# Patient Record
Sex: Male | Born: 1960
Health system: Southern US, Community
[De-identification: ages and names within clinical notes are randomized; demographics above are authoritative.]

## PROBLEM LIST (undated history)

## (undated) DIAGNOSIS — J45909 Unspecified asthma, uncomplicated: Secondary | ICD-10-CM

## (undated) DIAGNOSIS — Z9289 Personal history of other medical treatment: Secondary | ICD-10-CM

## (undated) DIAGNOSIS — I10 Essential (primary) hypertension: Secondary | ICD-10-CM

## (undated) DIAGNOSIS — M7711 Lateral epicondylitis, right elbow: Secondary | ICD-10-CM

## (undated) DIAGNOSIS — I341 Nonrheumatic mitral (valve) prolapse: Secondary | ICD-10-CM

## (undated) DIAGNOSIS — R079 Chest pain, unspecified: Secondary | ICD-10-CM

## (undated) DIAGNOSIS — R002 Palpitations: Secondary | ICD-10-CM

## (undated) DIAGNOSIS — R911 Solitary pulmonary nodule: Secondary | ICD-10-CM

## (undated) DIAGNOSIS — F411 Generalized anxiety disorder: Secondary | ICD-10-CM

## (undated) DIAGNOSIS — J309 Allergic rhinitis, unspecified: Secondary | ICD-10-CM

## (undated) HISTORY — DX: Personal history of other medical treatment: Z92.89

## (undated) HISTORY — DX: Palpitations: R00.2

## (undated) HISTORY — DX: Essential (primary) hypertension: I10

## (undated) HISTORY — PX: UMBILICAL HERNIA REPAIR: SHX196

## (undated) HISTORY — DX: Chest pain, unspecified: R07.9

## (undated) HISTORY — DX: Generalized anxiety disorder: F41.1

## (undated) HISTORY — DX: Allergic rhinitis, unspecified: J30.9

---

## 1999-06-15 ENCOUNTER — Encounter: Admission: RE | Admit: 1999-06-15 | Discharge: 1999-07-06 | Payer: Self-pay | Admitting: Internal Medicine

## 2000-03-20 ENCOUNTER — Emergency Department (HOSPITAL_COMMUNITY): Admission: EM | Admit: 2000-03-20 | Discharge: 2000-03-20 | Payer: Self-pay | Admitting: Emergency Medicine

## 2000-04-11 ENCOUNTER — Emergency Department (HOSPITAL_COMMUNITY): Admission: EM | Admit: 2000-04-11 | Discharge: 2000-04-11 | Payer: Self-pay | Admitting: Emergency Medicine

## 2001-03-10 ENCOUNTER — Ambulatory Visit (HOSPITAL_COMMUNITY): Admission: RE | Admit: 2001-03-10 | Discharge: 2001-03-10 | Payer: Self-pay | Admitting: Internal Medicine

## 2002-06-16 ENCOUNTER — Encounter: Payer: Self-pay | Admitting: Emergency Medicine

## 2002-06-16 ENCOUNTER — Emergency Department (HOSPITAL_COMMUNITY): Admission: EM | Admit: 2002-06-16 | Discharge: 2002-06-16 | Payer: Self-pay | Admitting: Cardiology

## 2002-08-06 ENCOUNTER — Emergency Department (HOSPITAL_COMMUNITY): Admission: EM | Admit: 2002-08-06 | Discharge: 2002-08-06 | Payer: Self-pay | Admitting: Emergency Medicine

## 2002-08-06 ENCOUNTER — Encounter: Payer: Self-pay | Admitting: Emergency Medicine

## 2002-11-20 ENCOUNTER — Emergency Department (HOSPITAL_COMMUNITY): Admission: EM | Admit: 2002-11-20 | Discharge: 2002-11-20 | Payer: Self-pay | Admitting: Emergency Medicine

## 2003-03-05 ENCOUNTER — Encounter: Payer: Self-pay | Admitting: Emergency Medicine

## 2003-03-05 ENCOUNTER — Emergency Department (HOSPITAL_COMMUNITY): Admission: EM | Admit: 2003-03-05 | Discharge: 2003-03-05 | Payer: Self-pay | Admitting: Emergency Medicine

## 2003-07-17 ENCOUNTER — Emergency Department (HOSPITAL_COMMUNITY): Admission: EM | Admit: 2003-07-17 | Discharge: 2003-07-17 | Payer: Self-pay | Admitting: Emergency Medicine

## 2003-07-17 ENCOUNTER — Encounter: Payer: Self-pay | Admitting: Emergency Medicine

## 2003-07-26 ENCOUNTER — Encounter: Admission: RE | Admit: 2003-07-26 | Discharge: 2003-07-26 | Payer: Self-pay | Admitting: Orthopedic Surgery

## 2003-07-26 ENCOUNTER — Encounter: Payer: Self-pay | Admitting: Orthopedic Surgery

## 2004-01-24 ENCOUNTER — Emergency Department (HOSPITAL_COMMUNITY): Admission: EM | Admit: 2004-01-24 | Discharge: 2004-01-24 | Payer: Self-pay | Admitting: Emergency Medicine

## 2004-05-20 ENCOUNTER — Encounter: Admission: RE | Admit: 2004-05-20 | Discharge: 2004-05-20 | Payer: Self-pay

## 2004-07-03 ENCOUNTER — Encounter: Admission: RE | Admit: 2004-07-03 | Discharge: 2004-07-03 | Payer: Self-pay | Admitting: Internal Medicine

## 2005-01-11 ENCOUNTER — Ambulatory Visit: Payer: Self-pay | Admitting: Pulmonary Disease

## 2005-03-19 ENCOUNTER — Emergency Department (HOSPITAL_COMMUNITY): Admission: EM | Admit: 2005-03-19 | Discharge: 2005-03-19 | Payer: Self-pay | Admitting: Emergency Medicine

## 2005-03-31 ENCOUNTER — Emergency Department (HOSPITAL_COMMUNITY): Admission: EM | Admit: 2005-03-31 | Discharge: 2005-03-31 | Payer: Self-pay | Admitting: Emergency Medicine

## 2005-05-21 ENCOUNTER — Ambulatory Visit: Payer: Self-pay | Admitting: Pulmonary Disease

## 2005-07-21 ENCOUNTER — Ambulatory Visit: Payer: Self-pay | Admitting: Pulmonary Disease

## 2005-07-22 ENCOUNTER — Ambulatory Visit: Payer: Self-pay | Admitting: Cardiology

## 2005-07-28 ENCOUNTER — Emergency Department (HOSPITAL_COMMUNITY): Admission: EM | Admit: 2005-07-28 | Discharge: 2005-07-28 | Payer: Self-pay | Admitting: Emergency Medicine

## 2006-01-07 ENCOUNTER — Ambulatory Visit: Payer: Self-pay | Admitting: Pulmonary Disease

## 2006-02-08 ENCOUNTER — Emergency Department (HOSPITAL_COMMUNITY): Admission: EM | Admit: 2006-02-08 | Discharge: 2006-02-08 | Payer: Self-pay | Admitting: Family Medicine

## 2006-03-17 IMAGING — CR DG CHEST 2V
2 series · 2 of 2 positions shown · non-contrast
Comparison: none

CLINICAL DATA: Dyspnea, left arm pain.
 CHEST - 2 VIEW:

[w chest pa]
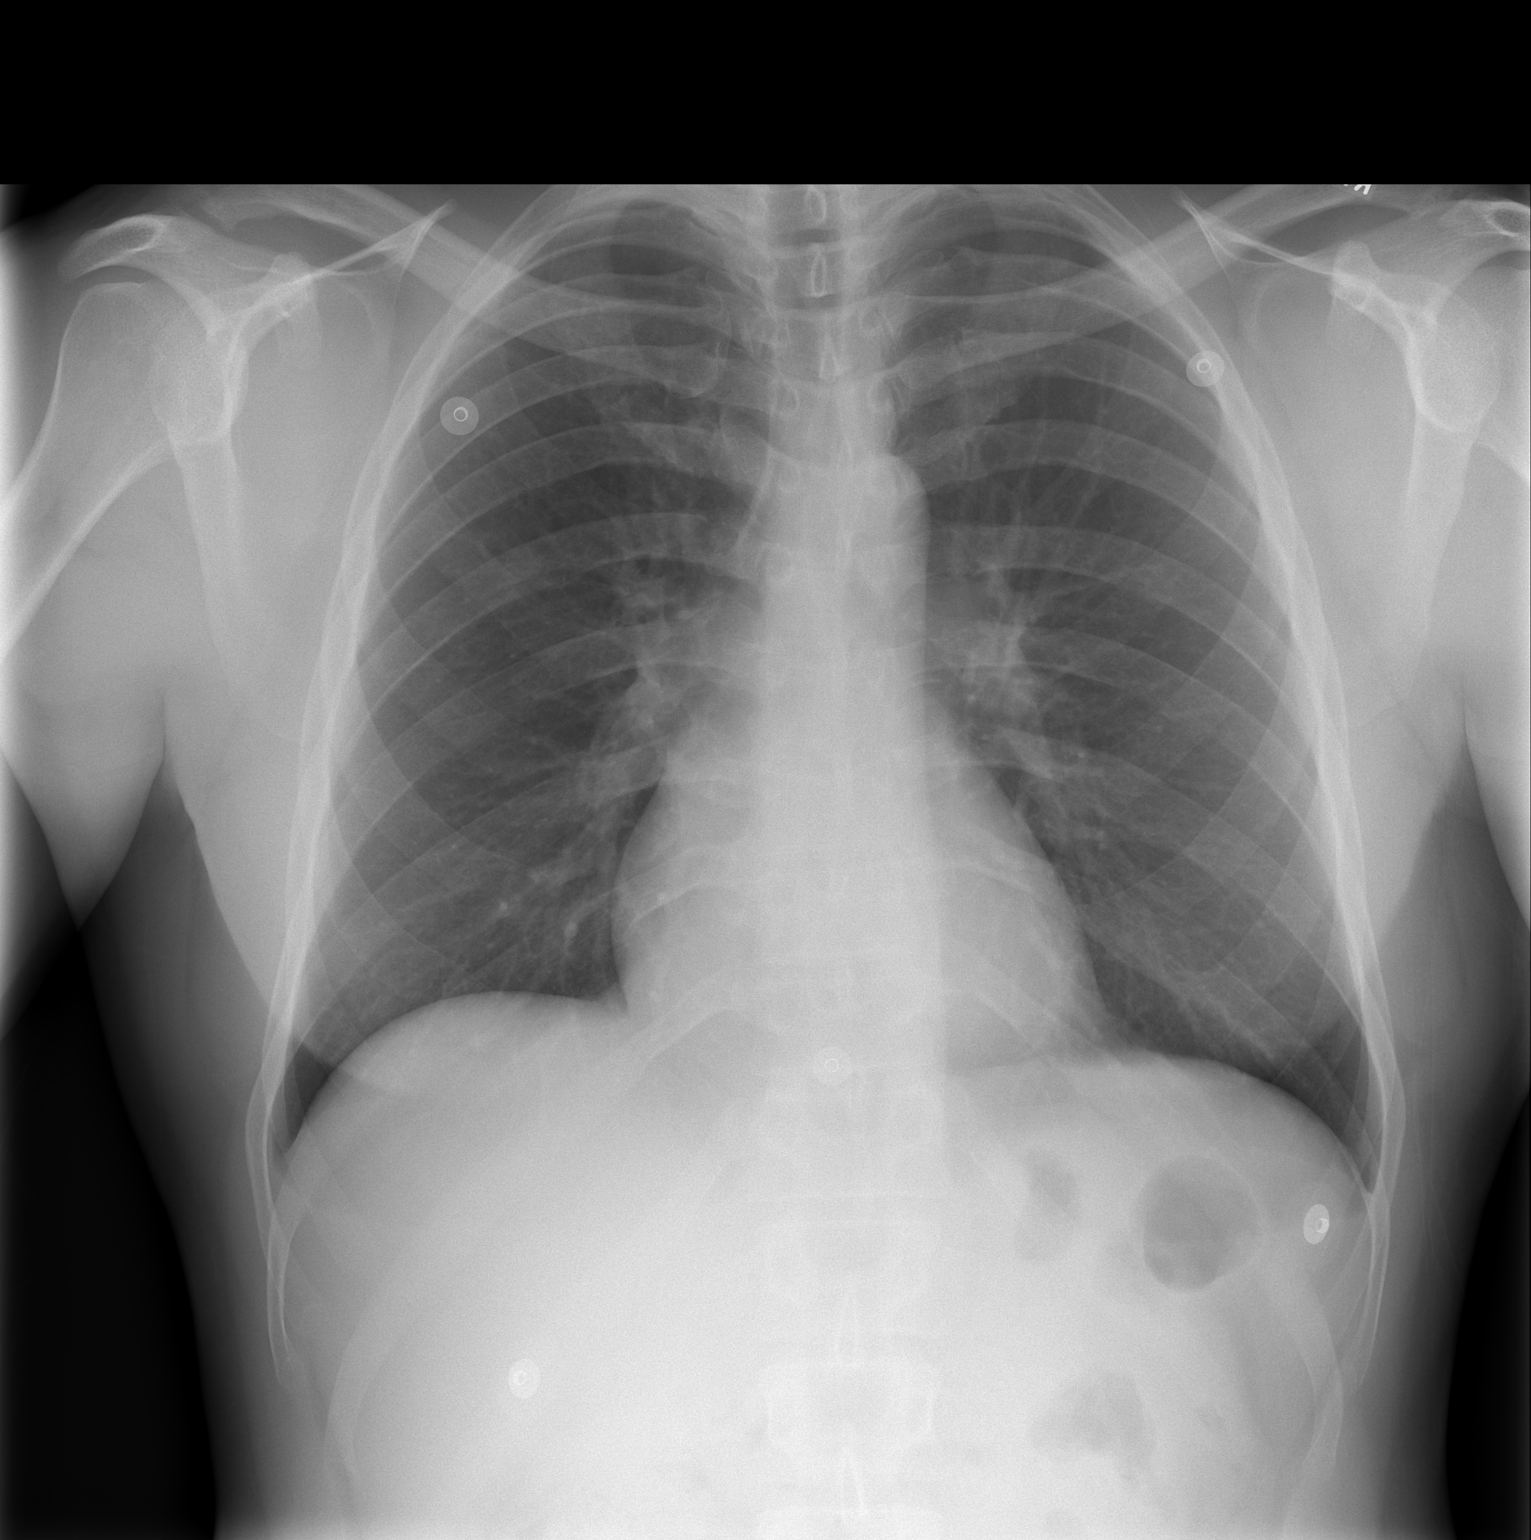

[w chest lat]
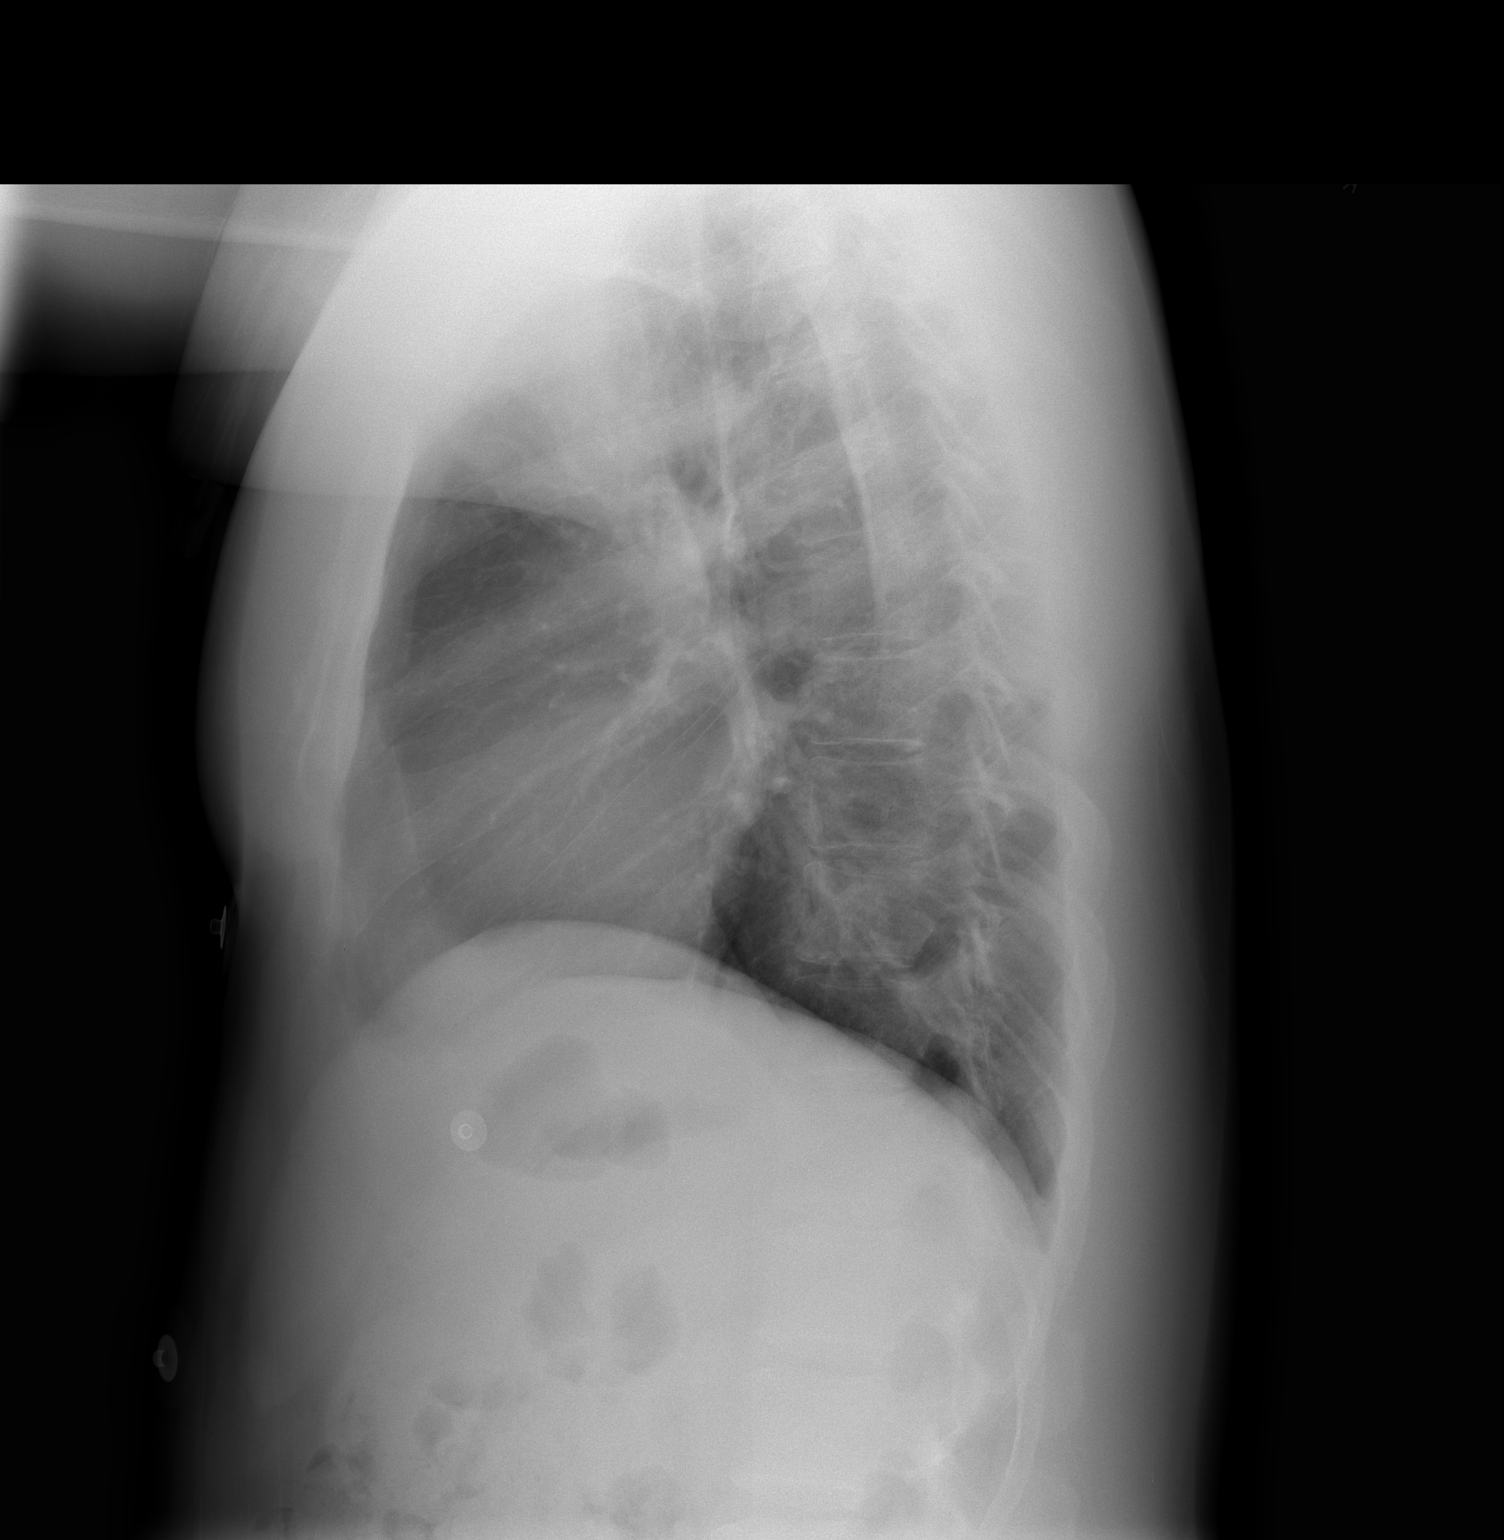

[2 of 2 positions shown; findings below may reference images not displayed]

FINDINGS: Cardiomediastinal silhouette is unremarkable.  Mild peribronchial thickening is unchanged.  No pleural effusions identified.
IMPRESSION: 1.  No acute abnormality.
 2.  Stable mild peribronchial thickening.

## 2006-05-24 ENCOUNTER — Ambulatory Visit: Payer: Self-pay | Admitting: Pulmonary Disease

## 2006-05-31 ENCOUNTER — Ambulatory Visit: Payer: Self-pay | Admitting: Pulmonary Disease

## 2006-06-20 ENCOUNTER — Ambulatory Visit: Payer: Self-pay | Admitting: Pulmonary Disease

## 2006-07-14 ENCOUNTER — Emergency Department (HOSPITAL_COMMUNITY): Admission: EM | Admit: 2006-07-14 | Discharge: 2006-07-14 | Payer: Self-pay | Admitting: Family Medicine

## 2006-07-25 ENCOUNTER — Ambulatory Visit: Payer: Self-pay | Admitting: Pulmonary Disease

## 2006-07-25 ENCOUNTER — Ambulatory Visit (HOSPITAL_COMMUNITY): Admission: RE | Admit: 2006-07-25 | Discharge: 2006-07-25 | Payer: Self-pay | Admitting: Pulmonary Disease

## 2006-10-29 ENCOUNTER — Emergency Department (HOSPITAL_COMMUNITY): Admission: AD | Admit: 2006-10-29 | Discharge: 2006-10-29 | Payer: Self-pay | Admitting: Emergency Medicine

## 2006-11-22 ENCOUNTER — Ambulatory Visit: Payer: Self-pay | Admitting: Pulmonary Disease

## 2006-12-18 ENCOUNTER — Emergency Department (HOSPITAL_COMMUNITY): Admission: EM | Admit: 2006-12-18 | Discharge: 2006-12-18 | Payer: Self-pay | Admitting: Family Medicine

## 2007-01-18 DIAGNOSIS — Z9289 Personal history of other medical treatment: Secondary | ICD-10-CM

## 2007-01-18 HISTORY — DX: Personal history of other medical treatment: Z92.89

## 2007-01-24 HISTORY — PX: SHOULDER ARTHROSCOPY: SHX128

## 2007-01-25 ENCOUNTER — Encounter: Admission: RE | Admit: 2007-01-25 | Discharge: 2007-01-25 | Payer: Self-pay | Admitting: Orthopedic Surgery

## 2007-02-17 ENCOUNTER — Ambulatory Visit (HOSPITAL_COMMUNITY): Admission: RE | Admit: 2007-02-17 | Discharge: 2007-02-17 | Payer: Self-pay | Admitting: Orthopedic Surgery

## 2007-10-07 IMAGING — CR DG CHEST 2V
2 series · 2 of 2 positions shown · non-contrast
Comparison: 07/28/05.

Exam: Chest, 2 views.

HISTORY: Right shoulder labral tear. Preoperative graft.

[w chest pa]
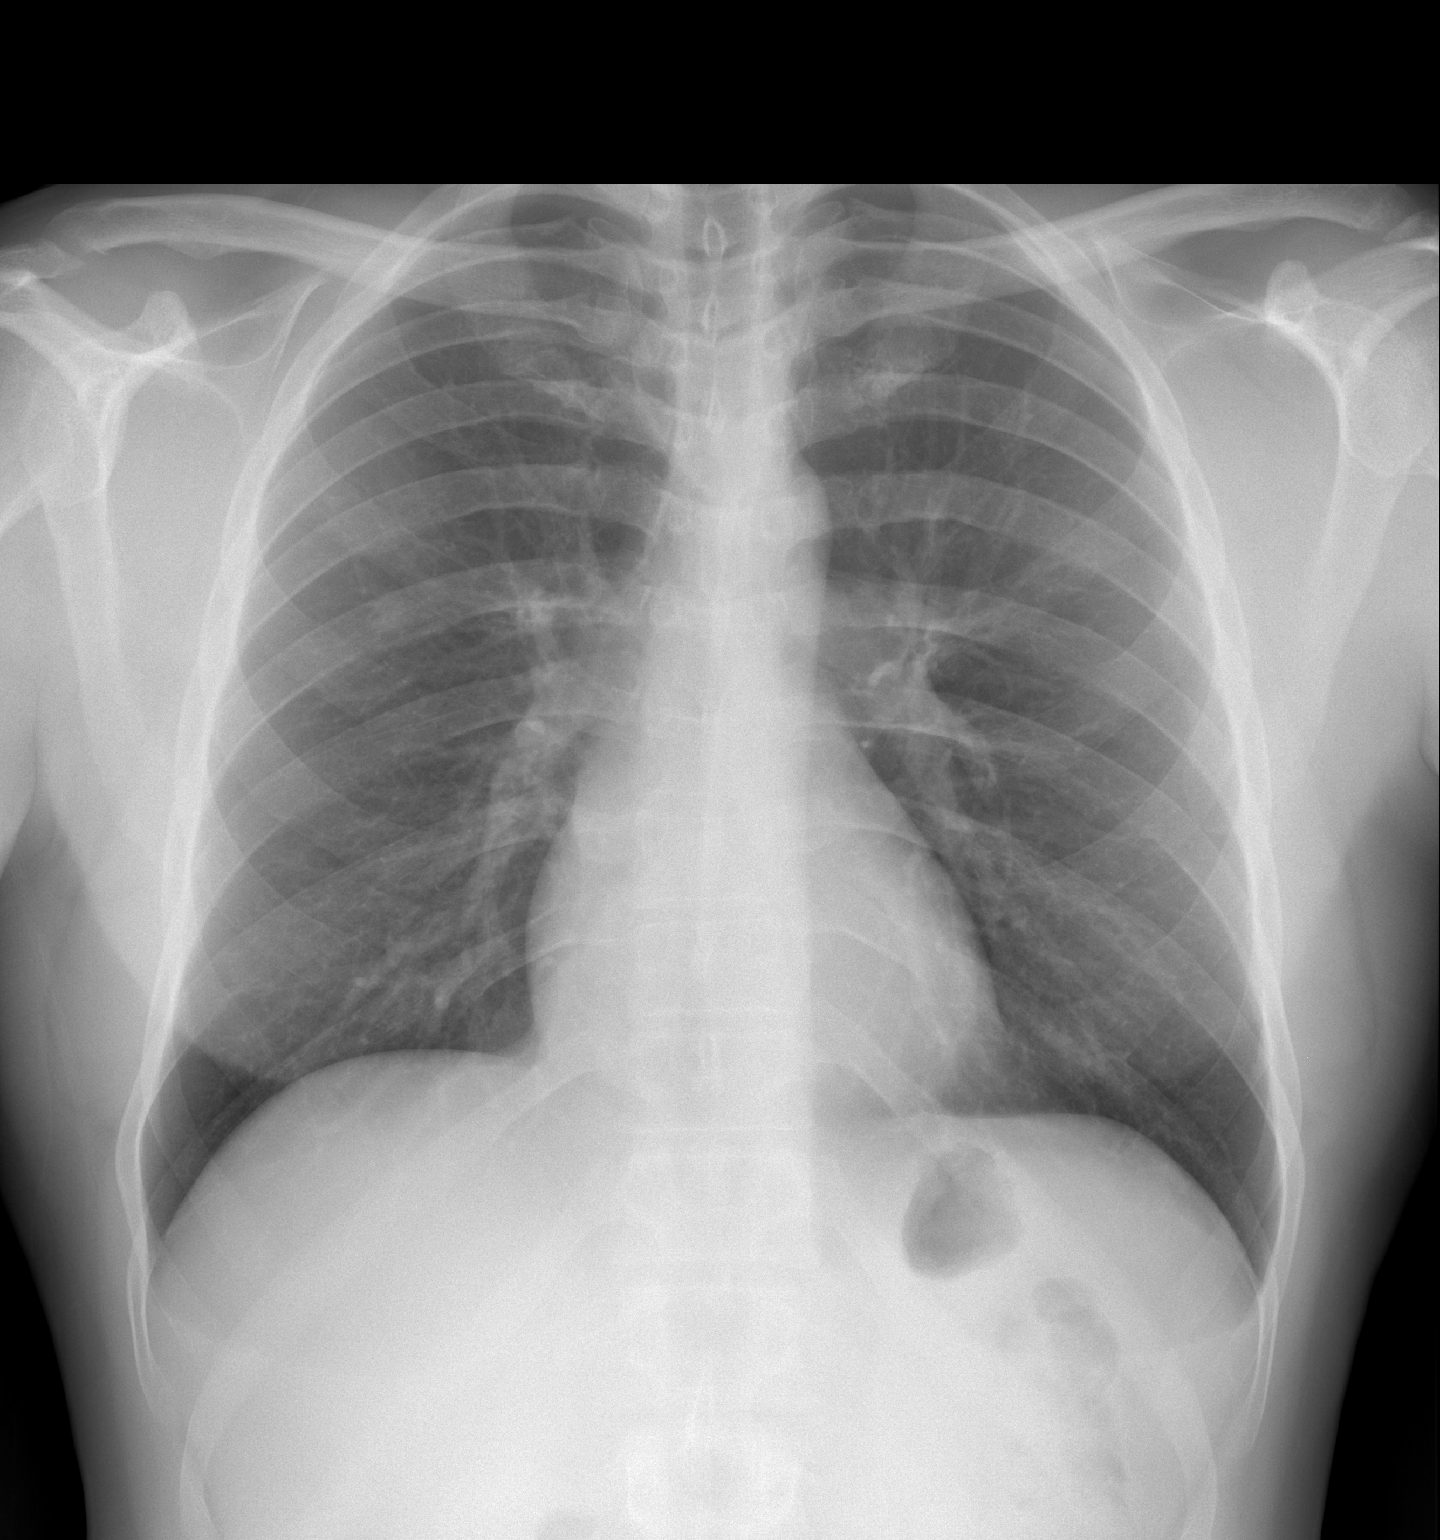

[w chest lat]
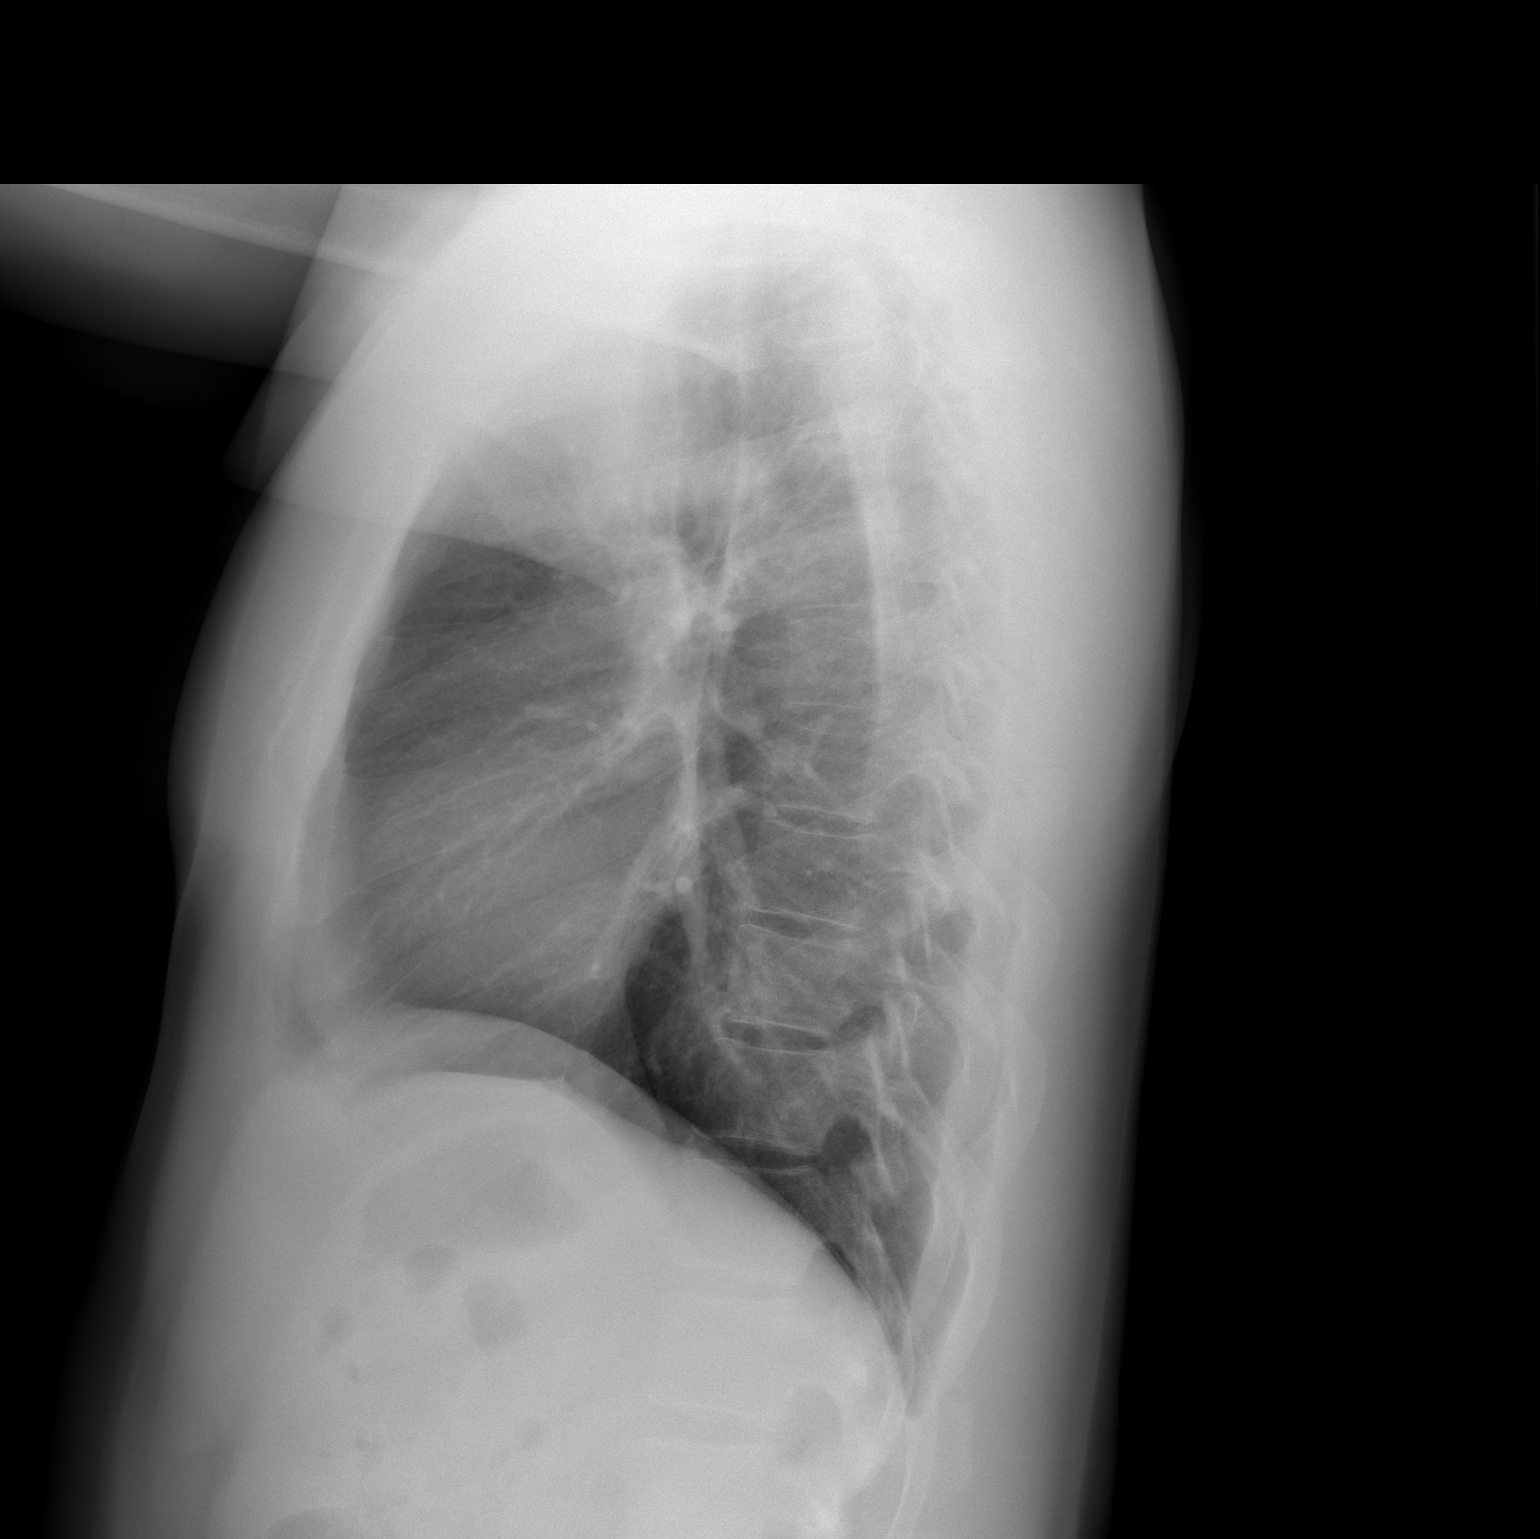

[2 of 2 positions shown; findings below may reference images not displayed]

FINDINGS: Heart size is normal.

No effusions or edema.

9 mm nodule within the right lung is unchanged in size from prior exam

No acute air space opacities.
IMPRESSION: 1. 9 mm pulmonary nodule in the right lung is unchanged from prior study.

## 2007-12-25 ENCOUNTER — Emergency Department (HOSPITAL_COMMUNITY): Admission: EM | Admit: 2007-12-25 | Discharge: 2007-12-25 | Payer: Self-pay | Admitting: Family Medicine

## 2008-02-22 DIAGNOSIS — J309 Allergic rhinitis, unspecified: Secondary | ICD-10-CM | POA: Insufficient documentation

## 2008-02-22 DIAGNOSIS — F411 Generalized anxiety disorder: Secondary | ICD-10-CM | POA: Insufficient documentation

## 2008-02-22 DIAGNOSIS — J45909 Unspecified asthma, uncomplicated: Secondary | ICD-10-CM | POA: Insufficient documentation

## 2008-05-13 ENCOUNTER — Encounter: Payer: Self-pay | Admitting: Pulmonary Disease

## 2008-05-24 ENCOUNTER — Encounter: Admission: RE | Admit: 2008-05-24 | Discharge: 2008-05-24 | Payer: Self-pay | Admitting: Orthopedic Surgery

## 2008-06-28 ENCOUNTER — Ambulatory Visit: Payer: Self-pay | Admitting: Pulmonary Disease

## 2008-06-29 DIAGNOSIS — M545 Low back pain, unspecified: Secondary | ICD-10-CM | POA: Insufficient documentation

## 2008-06-29 DIAGNOSIS — R079 Chest pain, unspecified: Secondary | ICD-10-CM | POA: Insufficient documentation

## 2008-06-29 DIAGNOSIS — R0789 Other chest pain: Secondary | ICD-10-CM | POA: Insufficient documentation

## 2008-06-29 DIAGNOSIS — R1013 Epigastric pain: Secondary | ICD-10-CM | POA: Insufficient documentation

## 2008-06-29 DIAGNOSIS — R002 Palpitations: Secondary | ICD-10-CM | POA: Insufficient documentation

## 2008-06-29 DIAGNOSIS — J383 Other diseases of vocal cords: Secondary | ICD-10-CM | POA: Insufficient documentation

## 2008-06-29 DIAGNOSIS — K3189 Other diseases of stomach and duodenum: Secondary | ICD-10-CM | POA: Insufficient documentation

## 2009-06-25 ENCOUNTER — Ambulatory Visit: Payer: Self-pay | Admitting: Pulmonary Disease

## 2010-02-01 ENCOUNTER — Encounter (INDEPENDENT_AMBULATORY_CARE_PROVIDER_SITE_OTHER): Payer: Self-pay | Admitting: Emergency Medicine

## 2010-02-01 ENCOUNTER — Emergency Department (HOSPITAL_COMMUNITY): Admission: EM | Admit: 2010-02-01 | Discharge: 2010-02-01 | Payer: Self-pay | Admitting: Family Medicine

## 2010-02-01 ENCOUNTER — Ambulatory Visit: Payer: Self-pay | Admitting: Vascular Surgery

## 2010-02-01 ENCOUNTER — Emergency Department (HOSPITAL_COMMUNITY): Admission: EM | Admit: 2010-02-01 | Discharge: 2010-02-01 | Payer: Self-pay | Admitting: Emergency Medicine

## 2010-03-20 ENCOUNTER — Encounter: Payer: Self-pay | Admitting: Pulmonary Disease

## 2010-06-24 ENCOUNTER — Ambulatory Visit: Payer: Self-pay | Admitting: Pulmonary Disease

## 2010-06-29 LAB — CONVERTED CEMR LAB
ALT: 35 units/L (ref 0–53)
AST: 27 units/L (ref 0–37)
Albumin: 4 g/dL (ref 3.5–5.2)
Alkaline Phosphatase: 60 units/L (ref 39–117)
BUN: 20 mg/dL (ref 6–23)
Basophils Absolute: 0.1 10*3/uL (ref 0.0–0.1)
Basophils Relative: 1.2 % (ref 0.0–3.0)
Bilirubin Urine: NEGATIVE
Bilirubin, Direct: 0.2 mg/dL (ref 0.0–0.3)
CO2: 30 meq/L (ref 19–32)
Calcium: 9.1 mg/dL (ref 8.4–10.5)
Chloride: 105 meq/L (ref 96–112)
Cholesterol: 167 mg/dL (ref 0–200)
Creatinine, Ser: 1.1 mg/dL (ref 0.4–1.5)
Eosinophils Absolute: 0.4 10*3/uL (ref 0.0–0.7)
Eosinophils Relative: 7.9 % — ABNORMAL HIGH (ref 0.0–5.0)
GFR calc non Af Amer: 93.96 mL/min (ref >60)
Glucose, Bld: 86 mg/dL (ref 70–99)
HCT: 41.2 % (ref 39.0–52.0)
HDL: 60.5 mg/dL (ref >39.00)
Hemoglobin, Urine: NEGATIVE
Hemoglobin: 14.5 g/dL (ref 13.0–17.0)
Ketones, ur: NEGATIVE mg/dL
LDL Cholesterol: 96 mg/dL (ref 0–99)
Leukocytes, UA: NEGATIVE
Lymphocytes Relative: 29.3 % (ref 12.0–46.0)
Lymphs Abs: 1.3 10*3/uL (ref 0.7–4.0)
MCHC: 35.1 g/dL (ref 30.0–36.0)
MCV: 95.4 fL (ref 78.0–100.0)
Monocytes Absolute: 0.5 10*3/uL (ref 0.1–1.0)
Monocytes Relative: 12 % (ref 3.0–12.0)
Neutro Abs: 2.2 10*3/uL (ref 1.4–7.7)
Neutrophils Relative %: 49.6 % (ref 43.0–77.0)
Nitrite: NEGATIVE
Platelets: 192 10*3/uL (ref 150.0–400.0)
Potassium: 4 meq/L (ref 3.5–5.1)
RBC: 4.32 M/uL (ref 4.22–5.81)
RDW: 13.6 % (ref 11.5–14.6)
Sodium: 141 meq/L (ref 135–145)
Specific Gravity, Urine: 1.025 (ref 1.000–1.030)
TSH: 1.86 microintl units/mL (ref 0.35–5.50)
Total Bilirubin: 1 mg/dL (ref 0.3–1.2)
Total CHOL/HDL Ratio: 3
Total Protein, Urine: NEGATIVE mg/dL
Total Protein: 6.8 g/dL (ref 6.0–8.3)
Triglycerides: 54 mg/dL (ref 0.0–149.0)
Urine Glucose: NEGATIVE mg/dL
Urobilinogen, UA: 1 (ref 0.0–1.0)
VLDL: 10.8 mg/dL (ref 0.0–40.0)
WBC: 4.4 10*3/uL — ABNORMAL LOW (ref 4.5–10.5)
pH: 6 (ref 5.0–8.0)

## 2010-07-01 ENCOUNTER — Encounter: Payer: Self-pay | Admitting: Pulmonary Disease

## 2010-07-08 ENCOUNTER — Ambulatory Visit: Payer: Self-pay | Admitting: Pulmonary Disease

## 2010-07-08 LAB — CONVERTED CEMR LAB
Fecal Occult Blood: NEGATIVE
OCCULT 1: NEGATIVE
OCCULT 2: NEGATIVE
OCCULT 3: NEGATIVE
OCCULT 4: NEGATIVE
OCCULT 5: NEGATIVE

## 2010-09-04 ENCOUNTER — Encounter: Payer: Self-pay | Admitting: Pulmonary Disease

## 2010-09-23 ENCOUNTER — Ambulatory Visit (HOSPITAL_BASED_OUTPATIENT_CLINIC_OR_DEPARTMENT_OTHER): Admission: RE | Admit: 2010-09-23 | Discharge: 2010-09-23 | Payer: Self-pay | Admitting: Orthopedic Surgery

## 2010-09-23 HISTORY — PX: OTHER SURGICAL HISTORY: SHX169

## 2010-09-24 HISTORY — PX: OTHER SURGICAL HISTORY: SHX169

## 2010-11-22 LAB — CONVERTED CEMR LAB
ALT: 26 units/L (ref 0–53)
ALT: 30 units/L (ref 0–53)
AST: 25 units/L (ref 0–37)
AST: 27 units/L (ref 0–37)
Albumin: 4.1 g/dL (ref 3.5–5.2)
Albumin: 4.2 g/dL (ref 3.5–5.2)
Alkaline Phosphatase: 61 units/L (ref 39–117)
Alkaline Phosphatase: 66 units/L (ref 39–117)
BUN: 16 mg/dL (ref 6–23)
BUN: 18 mg/dL (ref 6–23)
Bacteria, UA: NEGATIVE
Basophils Absolute: 0 10*3/uL (ref 0.0–0.1)
Basophils Absolute: 0 10*3/uL (ref 0.0–0.1)
Basophils Relative: 0 % (ref 0.0–3.0)
Basophils Relative: 0.5 % (ref 0.0–3.0)
Bilirubin Urine: NEGATIVE
Bilirubin Urine: NEGATIVE
Bilirubin, Direct: 0.1 mg/dL (ref 0.0–0.3)
Bilirubin, Direct: 0.2 mg/dL (ref 0.0–0.3)
CO2: 31 meq/L (ref 19–32)
CO2: 32 meq/L (ref 19–32)
Calcium: 9.3 mg/dL (ref 8.4–10.5)
Calcium: 9.3 mg/dL (ref 8.4–10.5)
Chloride: 104 meq/L (ref 96–112)
Chloride: 106 meq/L (ref 96–112)
Cholesterol: 174 mg/dL (ref 0–200)
Cholesterol: 196 mg/dL (ref 0–200)
Creatinine, Ser: 1.2 mg/dL (ref 0.4–1.5)
Creatinine, Ser: 1.3 mg/dL (ref 0.4–1.5)
Crystals: NEGATIVE
Eosinophils Absolute: 0.2 10*3/uL (ref 0.0–0.7)
Eosinophils Absolute: 0.2 10*3/uL (ref 0.0–0.7)
Eosinophils Relative: 3.2 % (ref 0.0–5.0)
Eosinophils Relative: 6.6 % — ABNORMAL HIGH (ref 0.0–5.0)
GFR calc Af Amer: 77 mL/min
GFR calc non Af Amer: 63 mL/min
GFR calc non Af Amer: 83.56 mL/min (ref >60)
Glucose, Bld: 103 mg/dL — ABNORMAL HIGH (ref 70–99)
Glucose, Bld: 87 mg/dL (ref 70–99)
HCT: 43 % (ref 39.0–52.0)
HCT: 43.8 % (ref 39.0–52.0)
HDL: 60 mg/dL (ref >39.00)
HDL: 73 mg/dL (ref >39.0)
Hemoglobin, Urine: NEGATIVE
Hemoglobin, Urine: NEGATIVE
Hemoglobin: 14.8 g/dL (ref 13.0–17.0)
Hemoglobin: 15.3 g/dL (ref 13.0–17.0)
Ketones, ur: NEGATIVE mg/dL
Ketones, ur: NEGATIVE mg/dL
LDL Cholesterol: 104 mg/dL — ABNORMAL HIGH (ref 0–99)
LDL Cholesterol: 114 mg/dL — ABNORMAL HIGH (ref 0–99)
Leukocytes, UA: NEGATIVE
Leukocytes, UA: NEGATIVE
Lymphocytes Relative: 23.7 % (ref 12.0–46.0)
Lymphocytes Relative: 29 % (ref 12.0–46.0)
Lymphs Abs: 1.1 10*3/uL (ref 0.7–4.0)
MCHC: 34.5 g/dL (ref 30.0–36.0)
MCHC: 35 g/dL (ref 30.0–36.0)
MCV: 95.2 fL (ref 78.0–100.0)
MCV: 96.2 fL (ref 78.0–100.0)
Monocytes Absolute: 0.4 10*3/uL (ref 0.1–1.0)
Monocytes Absolute: 0.6 10*3/uL (ref 0.1–1.0)
Monocytes Relative: 11.2 % (ref 3.0–12.0)
Monocytes Relative: 11.6 % (ref 3.0–12.0)
Mucus, UA: NEGATIVE
Neutro Abs: 2 10*3/uL (ref 1.4–7.7)
Neutro Abs: 2.9 10*3/uL (ref 1.4–7.7)
Neutrophils Relative %: 53.2 % (ref 43.0–77.0)
Neutrophils Relative %: 61 % (ref 43.0–77.0)
Nitrite: NEGATIVE
Nitrite: NEGATIVE
PSA: 0.9 ng/mL (ref 0.10–4.00)
PSA: 0.92 ng/mL (ref 0.10–4.00)
Platelets: 156 10*3/uL (ref 150–400)
Platelets: 202 10*3/uL (ref 150.0–400.0)
Potassium: 4.1 meq/L (ref 3.5–5.1)
Potassium: 4.5 meq/L (ref 3.5–5.1)
RBC / HPF: NONE SEEN
RBC: 4.51 M/uL (ref 4.22–5.81)
RBC: 4.55 M/uL (ref 4.22–5.81)
RDW: 12.2 % (ref 11.5–14.6)
RDW: 12.5 % (ref 11.5–14.6)
Sodium: 141 meq/L (ref 135–145)
Sodium: 142 meq/L (ref 135–145)
Specific Gravity, Urine: 1.01 (ref 1.000–1.03)
Specific Gravity, Urine: 1.02 (ref 1.000–1.030)
Squamous Epithelial / LPF: NEGATIVE /lpf
TSH: 1.59 microintl units/mL (ref 0.35–5.50)
TSH: 1.7 microintl units/mL (ref 0.35–5.50)
Total Bilirubin: 1.2 mg/dL (ref 0.3–1.2)
Total Bilirubin: 1.2 mg/dL (ref 0.3–1.2)
Total CHOL/HDL Ratio: 2.7
Total CHOL/HDL Ratio: 3
Total Protein, Urine: NEGATIVE mg/dL
Total Protein, Urine: NEGATIVE mg/dL
Total Protein: 7.5 g/dL (ref 6.0–8.3)
Total Protein: 7.7 g/dL (ref 6.0–8.3)
Triglycerides: 47 mg/dL (ref 0–149)
Triglycerides: 52 mg/dL (ref 0.0–149.0)
Urine Glucose: NEGATIVE mg/dL
Urine Glucose: NEGATIVE mg/dL
Urobilinogen, UA: 0.2 (ref 0.0–1.0)
Urobilinogen, UA: 0.2 (ref 0.0–1.0)
VLDL: 10.4 mg/dL (ref 0.0–40.0)
VLDL: 9 mg/dL (ref 0–40)
WBC, UA: NONE SEEN cells/hpf
WBC: 3.7 10*3/uL — ABNORMAL LOW (ref 4.5–10.5)
WBC: 4.8 10*3/uL (ref 4.5–10.5)
pH: 6 (ref 5.0–8.0)
pH: 6 (ref 5.0–8.0)

## 2010-11-25 ENCOUNTER — Telehealth: Payer: Self-pay | Admitting: Pulmonary Disease

## 2010-11-25 ENCOUNTER — Encounter: Payer: Self-pay | Admitting: Pulmonary Disease

## 2010-11-26 NOTE — Assessment & Plan Note (Signed)
Summary: per pt call/cb   Chief Complaint:  19 month ROV & CPX....  History of Present Illness: 50 y/o BM here for a follow up visit & CPX... he has Asthma & allergies and a hx of VCD... he is also followed by DrBerry for [facility] and was seen 7/09- he has a prev neg cardiac eval from [facility] and his CC was palpit, mostly related to caffeine, choc, etc...    Current Problem List:  ALLERGIC RHINITIS (ICD-477.9) - he uses OTC Zyrtek as needed...  ~  allergy testing in 1997 by DrSharmaL showed 4+ reactions to trees, grass, mold, dust, etc...  ~  CT Sinuses 9/06 was neg/ WNL...  ASTHMA (ICD-493.90) & Hx of VOCAL CORD DISORDER (ICD-478.5) - on ADVAIR 250Bid, & PROAIR as needed... he has decreased the Advair to once/d and rarely uses the [employer]...  ~  baseline CXR shows clear lungs, NAD...  ~  prev PFT's reveal mod airflow obstruction...  ? of PULMONARY NODULE (ICD-518.89) - old films w/ ? 9mm nodule right mid lung zone superimposed on post aspect of 7th rib... no change serially...  Hx of CHEST PAIN, ATYPICAL (ICD-786.59) & Hx of PALPITATIONS (ICD-785.1) - he is followed by [facility]- prev DrMcQueen & most recently DrBerry... ? MVP by Echo in 2000... neg Cardiolite 2002... CC is palpit that are worse w/ caffeine, choc, etc...  ~  baseline EKG shows NSR, WNL.Marland Kitchen.  ~  full eval by Cards 2008 w/ Holter, 2DEcho, Myoview- all normal...  ~  Myoview 3/08 was normal- no ischemia, no infarction, EF= 67%...  Hx of DYSPEPSIA (ICD-536.8) - uses NEXIUM 40mg /d as needed & he wants a refill... prev evals by DrPerry...  ~  last EGD 11/03 was WNL...  BACK PAIN, LUMBAR (ICD-724.2) - eval 7/09 from DrAplington w/ MRI L/S spine showinf L2 S1 disc bulging without compression... he still c/o discomfort in the muscles in his left leg... he will f/u w/ Ortho & Neuro for this...  ANXIETY (ICD-300.00) - he was perscribed some Hydroxyzine in the past...  Family Hx of OTH SYMPTOMS INVLV NERV&MUSCULOSKELETAL SYSTEMS  (ICD-781.99) - he has a brohter w/ ?myotonia, and a grandparent w/ hx of myasthenia gravis... eval by DrWillis in 2007 was neg- pt was concerned about some fatigue...  ~  EMG/ NCV 4/07 by DrWillis was totally normal w/o neuropathy or muscle disorder...      Current Allergies (reviewed today): No known allergies   Past Medical History:        ALLERGIC RHINITIS (ICD-477.9)    ASTHMA (ICD-493.90)    VOCAL CORD DISORDER (ICD-478.5)    ? of PULMONARY NODULE (ICD-518.89)    Hx of CHEST PAIN, ATYPICAL (ICD-786.59)    Hx of PALPITATIONS (ICD-785.1)    Hx of DYSPEPSIA (ICD-536.8)    BACK PAIN, LUMBAR (ICD-724.2)    ANXIETY (ICD-300.00)    Family Hx of OTH SYMPTOMS INVLV NERV&MUSCULOSKELETAL SYSTEMS (ICD-781.99)      Past Surgical History:    S/P right shoulder arthroscopy 4/08 by DrAplington   Family History:    Father died age 7 from a brain tumor    Mother alive age 8 w/ HBP & thyroid disease    6 Siblings- 3 Brothers & 3 Sisters    1Bro w/ hx ?myotonia/ muscle disorder  Social History:    Married    works [employer]    Never smoker    social E[place]    Review of Systems       The patient complains  of palpitations.  The patient denies fever, chills, sweats, anorexia, fatigue, weakness, malaise, weight loss, sleep disorder, blurring, diplopia, eye irritation, eye discharge, vision loss, eye pain, photophobia, earache, ear discharge, tinnitus, decreased hearing, nasal congestion, nosebleeds, sore throat, hoarseness, chest pain, syncope, dyspnea on exertion, orthopnea, PND, peripheral edema, cough, dyspnea at rest, excessive sputum, hemoptysis, wheezing, pleurisy, nausea, vomiting, diarrhea, constipation, change in bowel habits, abdominal pain, melena, hematochezia, jaundice, gas/bloating, indigestion/heartburn, dysphagia, odynophagia, dysuria, hematuria, urinary frequency, urinary hesitancy, nocturia, incontinence, back pain, joint pain, joint swelling, muscle cramps, muscle  weakness, stiffness, arthritis, sciatica, restless legs, leg pain at night, leg pain with exertion, rash, itching, dryness, suspicious lesions, paralysis, paresthesias, seizures, tremors, vertigo, transient blindness, frequent falls, frequent headaches, difficulty walking, depression, anxiety, memory loss, confusion, cold intolerance, heat intolerance, polydipsia, polyphagia, polyuria, unusual weight change, abnormal bruising, bleeding, enlarged lymph nodes, urticaria, allergic rash, hay fever, and recurrent infections.     Vital Signs:  Patient Profile:   51 Years Old Male Weight:      168.38 pounds O2 Sat:      98 % O2 treatment:    Room Air Temp:     97.1 degrees F oral Pulse rate:   63 / minute BP sitting:   100 / 60  (left arm)  Vitals Entered By: Marijo File CMA (June 28, 2008 9:17 AM)                 Physical Exam  WD, WN, 51 y/o BM in NAD... GENERAL:  Alert & oriented; pleasant & cooperative... HEENT:  [place]/AT, EOM-wnl, PERRLA, Fundi-benign, EACs-clear, TMs-wnl, NOSE-clear, THROAT-clear & wnl. NECK:  Supple w/ full ROM; no JVD; normal carotid impulses w/o bruits; no thyromegaly or nodules palpated; no lymphadenopathy. CHEST:  Clear to P & A; without wheezes/ rales/ or rhonchi heard... HEART:  Regular Rhythm; without murmurs/ rubs/ or gallops detected... ABDOMEN:  Soft & nontender; normal bowel sounds; no organomegaly or masses palpated... RECTAL:  Neg - prostate 3+ & nontender w/o nodules; stool hematest neg. EXT: without deformities or arthritic changes; no varicose veins/ venous insuffic/ or edema. NEURO:  CN's intact; motor testing normal; sensory testing normal; gait normal & balance OK. DERM:  No lesions noted; no rash etc...     CXR  Procedure date:  06/28/2008  Findings:      CHEST - 2 VIEW    Comparison: 02/17/2007    Findings: Stable 9 mm nodular density in the right midlung zone.   No mediastinal or hilar contour abnormality.  No pleural  effusion.    IMPRESSION:   Stable right lung nodule.    Read By:  Jonne Ply,  M.D.   Pulmonary Function Test Date: 06/28/2008 Gender: Male  Pre-Spirometry FVC    Value: 3.86 L/min   Pred: 4.65 L/min     % Pred: 83 % FEV1    Value: 2.49 L     Pred: 3.81 L     % Pred: 65 % FEV1/FVC  Value: 65 %     Pred: - %     % Pred: - % FEF 25-75  Value: 1.37 L/min   Pred: 3.98 L/min     % Pred: 34 %    Impression & Recommendations:  Problem # 1:  PHYSICAL EXAMINATION (ICD-V70.0) Good general health... he seems to have a mod amt of generalized anxiety...  Orders: EKG w/ Interpretation (93000) T-2 View CXR, Same Day (71020.5TC) Venipuncture (04540) TLB-Lipid Panel (80061-LIPID) TLB-BMP (Basic Metabolic Panel-BMET) (80048-METABOL) TLB-CBC  Platelet - w/Differential (85025-CBCD) TLB-Hepatic/Liver Function Pnl (80076-HEPATIC) TLB-TSH (Thyroid Stimulating Hormone) (84443-TSH) TLB-PSA (Prostate Specific Antigen) (84153-PSA) TLB-Udip w/ Micro (81001-URINE)   Problem # 2:  ALLERGIC RHINITIS (ICD-477.9) Stable- hasn't required allergy shots... His updated medication list for this problem includes:    Zyrtec Allergy 10 Mg Tabs (Cetirizine hcl) .Marland Kitchen... Take 1 by mouth once daily   Problem # 3:  ASTHMA (ICD-493.90) He hasn't had much trouble breathing... we haven't screened him for Xolair as yet... stable on meds... PFT's show mild obstruction and I rec to him to take the Advair Bid regularly...  His updated medication list for this problem includes:    Advair Diskus 250-50 Mcg/dose Misc (Fluticasone-salmeterol) ..... Inhale 1 puff two times a day & rinse mouth after each use...    Proair Hfa 108 (90 Base) Mcg/act Aers (Albuterol sulfate) .Marland Kitchen... 1-2 puffs four times a day as needed for wheezing...  Orders: Spirometry w/Graph (94010) T-2 View CXR, Same Day (71020.5TC)   Problem # 4:  Hx of PALPITATIONS (ICD-785.1) S/P full evals from [facility]... no meds required... stay off caffeine,  choc, tea, etc...  Problem # 5:  Hx of DYSPEPSIA (ICD-536.8) We refilled his Nexium per his request...  Problem # 6:  Family Hx of OTH SYMPTOMS INVLV NERV&MUSCULOSKELETAL SYSTEMS (ICD-781.99) He has been eval and reassured by DrWillis, Neurology...  Problem # 7:  BACK PAIN, LUMBAR (ICD-724.2) He will f/u w/ DrAplington & DrWillis for his discomfort... in the meanwhile Ibuprofen seems to help...  Complete Medication List: 1)  Zyrtec Allergy 10 Mg Tabs (Cetirizine hcl) .... Take 1 by mouth once daily 2)  Advair Diskus 250-50 Mcg/dose Misc (Fluticasone-salmeterol) .... Inhale 1 puff two times a day & rinse mouth after each use.Marland KitchenMarland Kitchen 3)  Proair Hfa 108 (90 Base) Mcg/act Aers (Albuterol sulfate) .Marland Kitchen.. 1-2 puffs four times a day as needed for wheezing... 4)  Nexium 40 Mg Cpdr (Esomeprazole magnesium) .... Take 1 tab by mouth once daily for stomach acid...   Patient Instructions: 1)  Today we updated your med list- see below.... 2)  We refilled your meds as requested... 3)  Stay as active as poss- with a regular exercise program... 4)  Follow up w/ DrAplington/ Orthopedics for further testing regarding your left leg discomfort... continue the Ibuprofen in the meanwhile... 5)  Todayb we did your follow up CXR & blood work... please call the "phone tree" in a few days for your lab results.Marland KitchenMarland Kitchen 6)  Call for any questions.Marland KitchenMarland Kitchen 7)  Please schedule a follow-up appointment in 1 year, sooner as needed   Prescriptions: PROAIR HFA 108 (90 BASE) MCG/ACT  AERS (ALBUTEROL SULFATE) 1-2 puffs four times a day as needed for wheezing...  #1 x prn   Entered and Authorized by:   Michele Mcalpine MD   Signed by:   Michele Mcalpine MD on 06/28/2008   Method used:   Print then Give to Patient   RxID:   [id number] ADVAIR DISKUS 250-50 MCG/DOSE  MISC (FLUTICASONE-SALMETEROL) Inhale 1 puff two times a day & rinse mouth after each use...  #1 x prn   Entered and Authorized by:   Michele Mcalpine MD   Signed by:   Michele Mcalpine MD on 06/28/2008   Method used:   Print then Give to Patient   RxID:   [id number] NEXIUM 40 MG CPDR (ESOMEPRAZOLE MAGNESIUM) take 1 tab by mouth once daily for stomach acid...  #30 x prn   Entered and Authorized by:  Michele Mcalpine MD   Signed by:   Michele Mcalpine MD on 06/28/2008   Method used:   Print then Give to Patient   RxID:   [id number]  ]

## 2010-11-26 NOTE — Letter (Signed)
Summary: [facility]  [facility]   Imported By: Sherian Rein 04/02/2010 09:34:33  _____________________________________________________________________  External Attachment:    Type:   Image     Comment:   External Document

## 2010-11-26 NOTE — Consult Note (Signed)
Summary: Office Note/[facility]  Office Note/[facility]   Imported By: Stephannie Li 05/17/2008 11:08:24  _____________________________________________________________________  External Attachment:    Type:   Image     Comment:   External Document

## 2010-11-26 NOTE — Assessment & Plan Note (Signed)
Summary: rov 1 yr///kp   CC:  Yearly ROV & CPX....  History of Present Illness: 50 y/o BM here for a follow up visit & CPX... he has Asthma & allergies and a hx of VCD... he is also followed by DrBerry for [facility]- prev neg cardiac eval from [facility]...   ~  June 25, 2009:  he reports doing well without new complaints or concerns... he cut the Advair100 to once daily & hasn't needed the Proair rescue... he has some plantar fascitis & saw podiatry w/ shot given...   ~  June 24, 2010:  he's had a good yr- no new complaints or concerns... no asthma flairs & continues Advair & Proair Prn... denies CP, palpit, SOB, etc... no GI or GU complaints... but he does note some hand problems> left hypothenar/ palmar pain ?early dupytren's scar tissue & right thumb IP joint swelling/ tender> advised Aleve Rx, hot soaks, and appt w/ DrSypher [facility])...    Current Problem List:  ALLERGIC RHINITIS (ICD-477.9) - he uses OTC Zyrtek as needed...  ~  allergy testing in 1997 by DrSharma showed 4+ reactions to trees, grass, mold, dust, etc...  ~  CT Sinuses 9/06 was neg/ WNL...  ASTHMA (ICD-493.90) & Hx of VOCAL CORD DISORDER (ICD-478.5) - on ADVAIR 100Bid (only using once per day), & PROAIR as needed...  ~  baseline CXR shows clear lungs, NAD...  ~  prev PFT's reveal mod airflow obstruction...  ? of PULMONARY NODULE (ICD-518.89) - old films w/ ? 9mm nodule right mid lung zone superimposed on post aspect of 7th rib... no change serially...  ~  CXR 9/10 showed stable 9mm RUL nodule- no change.  ~  CXR 8/11 showed clear lungs, no mention of the prev nodule, NAD...  Hx of CHEST PAIN, ATYPICAL (ICD-786.59) & Hx of PALPITATIONS (ICD-785.1) - he is followed by [facility]- prev DrMcQueen & most recently DrBerry... ? MVP by Echo in 2000... neg Cardiolite 2002... CC is palpit that are worse w/ caffeine, choc, etc...  ~  baseline EKG shows NSR, WNL.Marland Kitchen.  ~  full eval by Cards 2008 w/ Holter, 2DEcho, Myoview- all  normal...  ~  Myoview 3/08 was normal- no ischemia, no infarction, EF= 67%...  ~  8/11: denies CP, palpit, SOB, etc... exerc at [facility] & doing satis.  Hx of DYSPEPSIA (ICD-536.8) - uses OMEPRAZOLE 20mg  as needed... prev evals by DrPerry...  ~  last EGD 11/03 was WNL...  BACK PAIN, LUMBAR (ICD-724.2) - eval 7/09 from DrAplington w/ MRI L/S spine showing L2 S1 disc bulging without compression... he still c/o discomfort in the muscles in his left leg... he will f/u w/ Ortho & Neuro for this...  ANXIETY (ICD-300.00) - he was perscribed some Hydroxyzine in the past...  Family Hx of OTH SYMPTOMS INVLV NERV&MUSCULOSKELETAL SYSTEMS (ICD-781.99) - he has a brohter w/ ?myotonia, and a grandparent w/ hx of myasthenia gravis... eval by DrWillis in 2007 was neg- pt was concerned about some fatigue...  ~  EMG/ NCV 4/07 by DrWillis was totally normal w/o neuropathy or muscle disorder...   Preventive Screening-Counseling & Management  Alcohol-Tobacco     Smoking Status: never  Allergies (verified): No Known Drug Allergies  Comments:  Nurse/Medical Assistant: The patient's medications and allergies were reviewed with the patient and were updated in the Medication and Allergy Lists.  Past History:  Past Medical History: ALLERGIC RHINITIS (ICD-477.9) ASTHMA (ICD-493.90) VOCAL CORD DISORDER (ICD-478.5) ? of PULMONARY NODULE (ICD-518.89) Hx of CHEST PAIN, ATYPICAL (ICD-786.59) Hx  of PALPITATIONS (ICD-785.1) Hx of DYSPEPSIA (ICD-536.8) BACK PAIN, LUMBAR (ICD-724.2) ANXIETY (ICD-300.00) Family Hx of OTH SYMPTOMS INVLV NERV&MUSCULOSKELETAL SYSTEMS (ICD-781.99)  Past Surgical History: S/P right shoulder arthroscopy 4/08 by DrAplington  Family History: Reviewed history from 06/25/2009 and no changes required. Father died age 73 from a brain tumor Mother alive age 75 w/ HBP & thyroid disease 6 Siblings- 3 Brothers & 3 Sisters 1Bro w/ hx ?myotonia/ muscle disorder  Social History: Reviewed  history from 06/25/2009 and no changes required. Married, wife= Rena, 38yrs. 2 children works [employer] Never smoker social Etoh  Review of Systems  The patient denies fever, chills, sweats, anorexia, fatigue, weakness, malaise, weight loss, sleep disorder, blurring, diplopia, eye irritation, eye discharge, vision loss, eye pain, photophobia, earache, ear discharge, tinnitus, decreased hearing, nasal congestion, nosebleeds, sore throat, hoarseness, chest pain, palpitations, syncope, dyspnea on exertion, orthopnea, PND, peripheral edema, cough, dyspnea at rest, excessive sputum, hemoptysis, wheezing, pleurisy, nausea, vomiting, diarrhea, constipation, change in bowel habits, abdominal pain, melena, hematochezia, jaundice, gas/bloating, indigestion/heartburn, dysphagia, odynophagia, dysuria, hematuria, urinary frequency, urinary hesitancy, nocturia, incontinence, back pain, joint pain, joint swelling, muscle cramps, muscle weakness, stiffness, arthritis, sciatica, restless legs, leg pain at night, leg pain with exertion, rash, itching, dryness, suspicious lesions, paralysis, paresthesias, seizures, tremors, vertigo, transient blindness, frequent falls, frequent headaches, difficulty walking, depression, anxiety, memory loss, confusion, cold intolerance, heat intolerance, polydipsia, polyphagia, polyuria, unusual weight change, abnormal bruising, bleeding, enlarged lymph nodes, urticaria, allergic rash, hay fever, and recurrent infections.    Vital Signs:  Patient profile:   50 year old male Height:      69.5 inches Weight:      177.13 pounds BMI:     25.88 O2 Sat:      99 % on Room air Temp:     97.1 degrees F oral Pulse rate:   71 / minute BP sitting:   110 / 80  (left arm) Cuff size:   regular  Vitals Entered By: Randell Loop CMA (June 24, 2010 9:04 AM)  O2 Sat at Rest %:  99 O2 Flow:  Room air CC: Yearly ROV & CPX... Is Patient Diabetic? No Pain Assessment Patient in pain? no       Comments meds updated today with pt   Physical Exam  Additional Exam:  WD, WN, 49 y/o BM in NAD... GENERAL:  Alert & oriented; pleasant & cooperative... HEENT:  [place]/AT, EOM-wnl, PERRLA, Fundi-benign, EACs-clear, TMs-wnl, NOSE-clear, THROAT-clear & wnl. NECK:  Supple w/ full ROM; no JVD; normal carotid impulses w/o bruits; no thyromegaly or nodules palpated; no lymphadenopathy. CHEST:  Clear to P & A; without wheezes/ rales/ or rhonchi heard... HEART:  Regular Rhythm; without murmurs/ rubs/ or gallops detected... ABDOMEN:  Soft & nontender; normal bowel sounds; no organomegaly or masses palpated... RECTAL:  Neg - prostate 3+ & nontender w/o nodules; stool hematest neg. EXT: without deformities or arthritic changes; no varicose veins/ venous insuffic/ or edema... some swelling & tender in IP joint of right thumb. NEURO:  CN's intact; motor testing normal; sensory testing normal; gait normal & balance OK. DERM:  No lesions noted; no rash etc...    CXR  Procedure date:  06/24/2010  Findings:      CHEST - 2 VIEW Comparison:  06/25/2009   Findings:  The heart size and mediastinal contours are within normal limits.  Both lungs are clear.  The visualized skeletal structures are unremarkable.   IMPRESSION: No active cardiopulmonary disease.   Read By:  Danae Orleans,  M.D.   EKG  Procedure date:  06/24/2010  Findings:      Normal sinus rhythm with rate of:  64/ min... Tracing is WNL, NAD...  SN   MISC. Report  Procedure date:  06/24/2010  Findings:      BMP (METABOL)   Sodium                    141 mEq/L                   135-145   Potassium                 4.0 mEq/L                   3.5-5.1   Chloride                  105 mEq/L                   96-112   Carbon Dioxide            30 mEq/L                    19-32   Glucose                   86 mg/dL                    16-10   BUN                       20 mg/dL                    9-60   Creatinine                 1.1 mg/dL                   4.5-4.0   Calcium                   9.1 mg/dL                   9.8-11.9   GFR                       93.96 mL/min                >60  Hepatic/Liver Function Panel (HEPATIC)   Total Bilirubin           1.0 mg/dL                   1.4-7.8   Direct Bilirubin          0.2 mg/dL                   2.9-5.6   Alkaline Phosphatase      60 U/L                      39-117   AST                       27 U/L                      0-37   ALT  35 U/L                      0-53   Total Protein             6.8 g/dL                    9.5-6.2   Albumin                   4.0 g/dL                    1.3-0.8  CBC Platelet w/Diff (CBCD)   White Cell Count     [L]  4.4 K/uL                    4.5-10.5   Red Cell Count            4.32 Mil/uL                 4.22-5.81   Hemoglobin                14.5 g/dL                   65.7-84.6   Hematocrit                41.2 %                      39.0-52.0   MCV                       95.4 fl                     78.0-100.0   MCHC                      35.1 g/dL                   96.2-95.2   RDW                       13.6 %                      11.5-14.6   Platelet Count            192.0 K/uL                  150.0-400.0   Neutrophil %              49.6 %                      43.0-77.0   Lymphocyte %              29.3 %                      12.0-46.0   Monocyte %                12.0 %                      3.0-12.0   Eosinophils%         [H]  7.9 %                       0.0-5.0  Basophils %               1.2 %                       0.0-3.0  Comments:      Lipid Panel (LIPID)   Cholesterol               167 mg/dL                   1-610   Triglycerides             54.0 mg/dL                  9.6-045.4   HDL                       09.81 mg/dL                 >19.14   LDL Cholesterol           96 mg/dL                    7-82   TSH (TSH)   FastTSH                   1.86 uIU/mL                 0.35-5.50  UDip w/Micro  (URINE)   Color                     LT. YELLOW   Clarity                   CLEAR                       Clear   Specific Gravity          1.025                       1.000 - 1.030   Urine Ph                  6.0                         5.0-8.0   Protein                   NEGATIVE                    Negative   Urine Glucose             NEGATIVE                    Negative   Ketones                   NEGATIVE                    Negative   Urine Bilirubin           NEGATIVE                    Negative   Blood                     NEGATIVE  Negative   Urobilinogen              1.0                         0.0 - 1.0   Leukocyte Esterace        NEGATIVE                    Negative   Nitrite                   NEGATIVE                    Negative   Urine WBC                 0-2/hpf                     0-2/hpf   Impression & Recommendations:  Problem # 1:  PHYSICAL EXAMINATION (ICD-V70.0)  Orders: EKG w/ Interpretation (93000) T-2 View CXR (71020TC) TLB-BMP (Basic Metabolic Panel-BMET) (80048-METABOL) TLB-Hepatic/Liver Function Pnl (80076-HEPATIC) TLB-CBC Platelet - w/Differential (85025-CBCD) TLB-Lipid Panel (80061-LIPID) TLB-TSH (Thyroid Stimulating Hormone) (84443-TSH) TLB-Udip w/ Micro (81001-URINE)  Problem # 2:  ASTHMA (ICD-493.90) Stable>  same meds. His updated medication list for this problem includes:    Advair Diskus 100-50 Mcg/dose Aepb (Fluticasone-salmeterol) .Marland Kitchen... Take one puff two times a day   (rinse after each use)    Proair Hfa 108 (90 Base) Mcg/act Aers (Albuterol sulfate) .Marland Kitchen... 1-2 puffs four times a day as needed for wheezing...  Problem # 3:  ? of PULMONARY NODULE (ICD-518.89) No seen on current CXR...  Problem # 4:  Hx of PALPITATIONS (ICD-785.1) Cardiac stable- EKG is WNL.Marland KitchenMarland Kitchen  Problem # 5:  Hx of DYSPEPSIA (ICD-536.8) On Omep20 Prn...  Problem # 6:  ORTHO>>> We will refer to DrSypher to eval right hand... in the meanwhile Rx w/ OTC  anti-inflamm + hot soaks etc...  Complete Medication List: 1)  Zyrtec Allergy 10 Mg Tabs (Cetirizine hcl) .... Take 1 by mouth once daily 2)  Advair Diskus 100-50 Mcg/dose Aepb (Fluticasone-salmeterol) .... Take one puff two times a day   (rinse after each use) 3)  Proair Hfa 108 (90 Base) Mcg/act Aers (Albuterol sulfate) .Marland Kitchen.. 1-2 puffs four times a day as needed for wheezing...  Other Orders: Orthopedic Referral (Ortho)  Patient Instructions: 1)  Today we updated your med list- see below.... 2)  Continue your current meds the same... 3)  Add ALEVE OTC & some hot soaks for your hand discomfort... we will arrange for an Ortho eval at the [facility]... 4)  Today we did your folow up CXR, EKG,& Fasting bllood work... please call the "phone tree" in a few days for your lab results.Marland KitchenMarland Kitchen 5)  Please collect the "stool cards" at your convenience so we can check for hidden blood... 6)  Call for any problems.Marland KitchenMarland Kitchen 7)  Please schedule a follow-up appointment in 1 year. Prescriptions: ADVAIR DISKUS 100-50 MCG/DOSE AEPB (FLUTICASONE-SALMETEROL) take one puff two times a day   (rinse after each use)  #1 x 12   Entered and Authorized by:   Michele Mcalpine MD   Signed by:   Michele Mcalpine MD on 06/24/2010   Method used:   Print then Give to Patient   RxID:   [id number]    CardioPerfect ECG  ID: [id number] Patient: RIGGINS, CISEK DOB: 1961-07-04 Age: 50 Years Old  Sex: Male Race: Black Physician: scott nadel Technician: Randell Loop CMA Height: 69.5 Weight: 177.13 Status: Unconfirmed Past Medical History:  ALLERGIC RHINITIS (ICD-477.9) ASTHMA (ICD-493.90) VOCAL CORD DISORDER (ICD-478.5) ? of PULMONARY NODULE (ICD-518.89) Hx of CHEST PAIN, ATYPICAL (ICD-786.59) Hx of PALPITATIONS (ICD-785.1) Hx of DYSPEPSIA (ICD-536.8) BACK PAIN, LUMBAR (ICD-724.2) ANXIETY (ICD-300.00) Family Hx of OTH SYMPTOMS INVLV NERV&MUSCULOSKELETAL SYSTEMS (ICD-781.99)   Recorded: 06/24/2010 09:14 AM P/PR:  108 ms / 178 ms - Heart rate (maximum exercise) QRS: 87 QT/QTc/QTd: 384 ms / 393 ms / 81 ms - Heart rate (maximum exercise)  P/QRS/T axis: 71 deg / 55 deg / 40 deg - Heart rate (maximum exercise)  Heartrate: 65 bpm  Interpretation:  Normal sinus rhythm with rate of:  64/ min... Tracing is WNL, NAD...  SN

## 2010-11-26 NOTE — Consult Note (Signed)
Summary: [facility] page 2 of 2  [facility] page 2 of 2   Imported By: Lennie Odor 07/09/2010 15:48:35  _____________________________________________________________________  External Attachment:    Type:   Image     Comment:   External Document

## 2010-11-26 NOTE — Letter (Signed)
Summary: [facility]  [facility]   Imported By: Lennie Odor 09/10/2010 14:44:57  _____________________________________________________________________  External Attachment:    Type:   Image     Comment:   External Document

## 2010-11-26 NOTE — Assessment & Plan Note (Signed)
Summary: Jerry Horton 2 yr////kp   CC:  Yearly Jerry Horton & CPX....  History of Present Illness: 50 y/o BM here for a follow up visit & CPX... Jerry Horton has Asthma & allergies and a hx of VCD... Jerry Horton is also followed by DrBerry for [facility] and was seen 7/09- Jerry Horton has a prev neg cardiac eval from [facility] and his CC was palpit, mostly related to caffeine, choc, etc...    ~  June 25, 2009:  Jerry Horton reports doing well without new complaints or concerns... Jerry Horton cut the Advair100 to once daily & hasn't needed the Proair rescue... Jerry Horton has some plantar fascitis & saw podiatry w/ shot given...    Current Problem List:  ALLERGIC RHINITIS (ICD-477.9) - Jerry Horton uses OTC Zyrtek as needed...  ~  allergy testing in 1997 by DrSharmaL showed 4+ reactions to trees, grass, mold, dust, etc...  ~  CT Sinuses 9/06 was neg/ WNL...  ASTHMA (ICD-493.90) & Hx of VOCAL CORD DISORDER (ICD-478.5) - on ADVAIR 100Bid, & PROAIR as needed... Jerry Horton has decreased the Advair to once/d and rarely uses the [employer]...  ~  baseline CXR shows clear lungs, NAD...  ~  prev PFT's reveal mod airflow obstruction...  ? of PULMONARY NODULE (ICD-518.89) - old films w/ ? 9mm nodule right mid lung zone superimposed on post aspect of 7th rib... no change serially...  ~  CXR 9/10 showed stable 9mm RUL nodule- no change.  Hx of CHEST PAIN, ATYPICAL (ICD-786.59) & Hx of PALPITATIONS (ICD-785.1) - Jerry Horton is followed by [facility]- prev DrMcQueen & most recently DrBerry... ? MVP by Echo in 2000... neg Cardiolite 2002... CC is palpit that are worse w/ caffeine, choc, etc...  ~  baseline EKG shows NSR, WNL.Marland Kitchen.  ~  full eval by Cards 2008 w/ Holter, 2DEcho, Myoview- all normal...  ~  Myoview 3/08 was normal- no ischemia, no infarction, EF= 67%...  Hx of DYSPEPSIA (ICD-536.8) - uses NEXIUM 40mg /d as needed & Jerry Horton wants a refill... prev evals by DrPerry...  ~  last EGD 11/03 was WNL...  BACK PAIN, LUMBAR (ICD-724.2) - eval 7/09 from DrAplington w/ MRI L/S spine showinf L2 S1 disc bulging without  compression... Jerry Horton still c/o discomfort in the muscles in his left leg... Jerry Horton will f/u w/ Ortho & Neuro for this...  ANXIETY (ICD-300.00) - Jerry Horton was perscribed some Hydroxyzine in the past...  Family Hx of OTH SYMPTOMS INVLV NERV&MUSCULOSKELETAL SYSTEMS (ICD-781.99) - Jerry Horton has a brohter w/ ?myotonia, and a grandparent w/ hx of myasthenia gravis... eval by DrWillis in 2007 was neg- pt was concerned about some fatigue...  ~  EMG/ NCV 4/07 by DrWillis was totally normal w/o neuropathy or muscle disorder...   Allergies (verified): No Known Drug Allergies  Comments:  Nurse/Medical Assistant: The patient's medications and allergies were reviewed with the patient and were updated in the Medication and Allergy Lists.  Past History:  Past Medical History: ALLERGIC RHINITIS (ICD-477.9) ASTHMA (ICD-493.90) VOCAL CORD DISORDER (ICD-478.5) ? of PULMONARY NODULE (ICD-518.89) Hx of CHEST PAIN, ATYPICAL (ICD-786.59) Hx of PALPITATIONS (ICD-785.1) Hx of DYSPEPSIA (ICD-536.8) BACK PAIN, LUMBAR (ICD-724.2) ANXIETY (ICD-300.00) Family Hx of OTH SYMPTOMS INVLV NERV&MUSCULOSKELETAL SYSTEMS (ICD-781.99)  Past Surgical History: S/P right shoulder arthroscopy 4/08 by DrAplington  Family History: Father died age 52 from a brain tumor Mother alive age 44 w/ HBP & thyroid disease 6 Siblings- 3 Brothers & 3 Sisters 1Bro w/ hx ?myotonia/ muscle disorder  Social History: Married, wife= Rena, 46yrs. 2 children works [employer] Never smoker social Etoh  Review  of Systems  The patient denies fever, chills, sweats, anorexia, fatigue, weakness, malaise, weight loss, sleep disorder, blurring, diplopia, eye irritation, eye discharge, vision loss, eye pain, photophobia, earache, ear discharge, tinnitus, decreased hearing, nasal congestion, nosebleeds, sore throat, hoarseness, chest pain, palpitations, syncope, dyspnea on exertion, orthopnea, PND, peripheral edema, cough, dyspnea at rest, excessive sputum,  hemoptysis, wheezing, pleurisy, nausea, vomiting, diarrhea, constipation, change in bowel habits, abdominal pain, melena, hematochezia, jaundice, gas/bloating, indigestion/heartburn, dysphagia, odynophagia, dysuria, hematuria, urinary frequency, urinary hesitancy, nocturia, incontinence, back pain, joint pain, joint swelling, muscle cramps, muscle weakness, stiffness, arthritis, sciatica, restless legs, leg pain at night, leg pain with exertion, rash, itching, dryness, suspicious lesions, paralysis, paresthesias, seizures, tremors, vertigo, transient blindness, frequent falls, frequent headaches, difficulty walking, depression, anxiety, memory loss, confusion, cold intolerance, heat intolerance, polydipsia, polyphagia, polyuria, unusual weight change, abnormal bruising, bleeding, enlarged lymph nodes, urticaria, allergic rash, hay fever, and recurrent infections.    Vital Signs:  Patient profile:   50 year old male Height:      69.5 inches Weight:      169.25 pounds BMI:     24.72 O2 Sat:      98 % on Room air Temp:     97.0 degrees F oral Pulse rate:   72 / minute BP sitting:   122 / 68  (left arm) Cuff size:   regular  Vitals Entered By: Marijo File CMA (June 25, 2009 9:04 AM)  O2 Sat at Rest %:  98 O2 Flow:  Room air CC: Yearly Jerry Horton & CPX... Comments no changes in meds   Physical Exam  Additional Exam:  WD, WN, 50 y/o BM in NAD... GENERAL:  Alert & oriented; pleasant & cooperative... HEENT:  Harris/AT, EOM-wnl, PERRLA, Fundi-benign, EACs-clear, TMs-wnl, NOSE-clear, THROAT-clear & wnl. NECK:  Supple w/ full ROM; no JVD; normal carotid impulses w/o bruits; no thyromegaly or nodules palpated; no lymphadenopathy. CHEST:  Clear to P & A; without wheezes/ rales/ or rhonchi heard... HEART:  Regular Rhythm; without murmurs/ rubs/ or gallops detected... ABDOMEN:  Soft & nontender; normal bowel sounds; no organomegaly or masses palpated... RECTAL:  Neg - prostate 3+ & nontender w/o nodules;  stool hematest neg. EXT: without deformities or arthritic changes; no varicose veins/ venous insuffic/ or edema. NEURO:  CN's intact; motor testing normal; sensory testing normal; gait normal & balance OK. DERM:  No lesions noted; no rash etc...     EKG  Procedure date:  06/25/2009  Findings:      Normal sinus rhythm with rate of:  64/min... WNL, NAD....  CXR  Procedure date:  06/25/2009  Findings:      CHEST - 2 VIEW   Comparison: 06/28/2008   Findings: Stable 9 mm right midlung nodule.  Lungs remain clear. Normal heart size and vascularity.  No edema, pneumonia, consolidation, effusion or pneumothorax.  Midline trachea.   IMPRESSION: No acute chest finding. Stable right mid lung 9 mm nodule   Read By:  Sigurd Sos.,  M.D.      MISC. Report  Procedure date:  06/25/2009  Findings:      Tests: (1) Lipid Panel (LIPID)   Cholesterol               174 mg/dL                   5-638   Triglycerides             52.0 mg/dL  0.0-149.0   HDL                       19.14 mg/dL                 >78.29   VLDL Cholesterol          10.4 mg/dL                  5.6-21.3   LDL Cholesterol      [H]  086 mg/dL                   5-78  Tests: (2) BMP (METABOL)   Sodium                    141 mEq/L                   135-145   Potassium                 4.1 mEq/L                   3.5-5.1   Chloride                  104 mEq/L                   96-112   Carbon Dioxide            32 mEq/L                    19-32   Glucose                   87 mg/dL                    46-96   BUN                       16 mg/dL                    2-95   Creatinine                1.2 mg/dL                   2.8-4.1   Calcium                   9.3 mg/dL                   3.2-44.0   GFR                       83.56 mL/min                >60  Tests: (3) CBC Platelet w/Diff (CBCD)   White Cell Count     [L]  3.7 K/uL                    4.5-10.5   Red Cell Count            4.51 Mil/uL                  4.22-5.81   Hemoglobin                14.8 g/dL  13.0-17.0   Hematocrit                43.0 %                      39.0-52.0   MCV                       95.2 fl                     78.0-100.0   MCHC                      34.5 g/dL                   16.1-09.6   RDW                       12.2 %                      11.5-14.6  Platelet Count            202.0 K/uL                  150.0-400.0   Neutrophil %              53.2 %                      43.0-77.0   Lymphocyte %              29.0 %                      12.0-46.0   Monocyte %                11.2 %                      3.0-12.0   Eosinophils%         [H]  6.6 %                       0.0-5.0   Basophils %               0.0 %       Comments:      Tests: (4) Hepatic/Liver Function Panel (HEPATIC)   Total Bilirubin           1.2 mg/dL                   0.4-5.4   Direct Bilirubin          0.1 mg/dL                   0.9-8.1   Alkaline Phosphatase      66 U/L                      39-117   AST                       27 U/L                      0-37   ALT                       30 U/L  0-53   Total Protein             7.7 g/dL                    9.1-4.7   Albumin                   4.1 g/dL                    8.2-9.5  Tests: (5) TSH (TSH)   FastTSH                   1.70 uIU/mL                 0.35-5.50  Tests: (6) Prostate Specific Antigen (PSA)   PSA-Hyb                   0.92 ng/mL                  0.10-4.00  Tests: (7) UDip w/Micro (URINE)   Color                     LT. YELLOW   Clarity                   CLEAR                       Clear   Specific Gravity          1.020                       1.000 - 1.030   Urine Ph                  6.0                         5.0-8.0   Protein                   NEGATIVE                    Negative   Urine Glucose             NEGATIVE                    Negative   Ketones                   NEGATIVE                    Negative   Urine Bilirubin            NEGATIVE                    Negative   Blood                     NEGATIVE                    Negative   Urobilinogen              0.2                         0.0 - 1.0   Leukocyte Esterace        NEGATIVE  Negative   Nitrite                   NEGATIVE                    Impression & Recommendations:  Problem # 1:  PHYSICAL EXAMINATION (ICD-V70.0) Good general health...  Orders: EKG w/ Interpretation (93000) T-2 View CXR (71020TC) Venipuncture (04540) TLB-Lipid Panel (80061-LIPID) TLB-BMP (Basic Metabolic Panel-BMET) (80048-METABOL) TLB-CBC Platelet - w/Differential (85025-CBCD) TLB-Hepatic/Liver Function Pnl (80076-HEPATIC) TLB-TSH (Thyroid Stimulating Hormone) (84443-TSH) TLB-PSA (Prostate Specific Antigen) (84153-PSA) TLB-Udip w/ Micro (81001-URINE)  Problem # 2:  ASTHMA (ICD-493.90) Stable on the Advair once paily, and hasn't needed the Proair rescue... His updated medication list for this problem includes:    Advair Diskus 100-50 Mcg/dose Aepb (Fluticasone-salmeterol) .Marland Kitchen... Take one puff two times a day   (rinse after each use)    Proair Hfa 108 (90 Base) Mcg/act Aers (Albuterol sulfate) .Marland Kitchen... 1-2 puffs four times a day as needed for wheezing...  Problem # 3:  ? of PULMONARY NODULE (ICD-518.89) F/u CXR w/o change in the small RUL 9mm nodule-  continue to folow...  Problem # 4:  Hx of DYSPEPSIA (ICD-536.8) stable on the Omeprazole Prn...  Problem # 5:  OTHER MEDICAL PROBLEMS AS NOTED>>>  Complete Medication List: 1)  Zyrtec Allergy 10 Mg Tabs (Cetirizine hcl) .... Take 1 by mouth once daily 2)  Advair Diskus 100-50 Mcg/dose Aepb (Fluticasone-salmeterol) .... Take one puff two times a day   (rinse after each use) 3)  Proair Hfa 108 (90 Base) Mcg/act Aers (Albuterol sulfate) .Marland Kitchen.. 1-2 puffs four times a day as needed for wheezing... 4)  Omeprazole 20 Mg Cpdr (Omeprazole) .... Take 1 tab by mouth once daily as directed for stomach acid...  Other  Orders: Prescription Created Electronically [phone])  Patient Instructions: 1)  Today we updated your med list- see below.... 2)  We decided to change the Nexium to Prilosec/ Omeprazole as discussed... 3)  Today we did your follow up CXR & fasting blood work... please call the "phone tree" in a few days for your lab results.Marland KitchenMarland Kitchen 4)  Let's get back to the gym & restart our exercise program... 5)  Call for any problems.Marland KitchenMarland Kitchen 6)  Please schedule a follow-up appointment in 1 year, sooner as needed. Prescriptions: OMEPRAZOLE 20 MG CPDR (OMEPRAZOLE) take 1 tab by mouth once daily as directed for stomach acid...  #30 x prn   Entered and Authorized by:   Michele Mcalpine MD   Signed by:   Michele Mcalpine MD on 06/25/2009   Method used:   Print then Give to Patient   RxID:   [id number] PROAIR HFA 108 (90 BASE) MCG/ACT  AERS (ALBUTEROL SULFATE) 1-2 puffs four times a day as needed for wheezing...  #1 x prn   Entered and Authorized by:   Michele Mcalpine MD   Signed by:   Michele Mcalpine MD on 06/25/2009   Method used:   Print then Give to Patient   RxID:   [id number] ADVAIR DISKUS 100-50 MCG/DOSE AEPB (FLUTICASONE-SALMETEROL) take one puff two times a day   (rinse after each use)  #1 x prn   Entered and Authorized by:   Michele Mcalpine MD   Signed by:   Michele Mcalpine MD on 06/25/2009   Method used:   Print then Give to Patient   RxID:   [id number]

## 2010-12-02 NOTE — Progress Notes (Signed)
Summary: pain in chest  Phone Note Call from Patient Call back at Home Phone [phone]   Caller: Patient Call For: Natoshia Souter Summary of Call: Patient saying he is having pain in upper chest near neck area.  Patient says this happened last week and last night.  Requesting an appt w/ Dr. Kriste Basque.   Initial call taken by: Lehman Prom,  November 25, 2010 8:25 AM  Follow-up for Phone Call        Spoke with pt and he is c/o having some chest pain on left side of chest that radiated to left sid eof neck and shoulder. Pt states the pain comes and goes since last night. He has a history of atypical chest pain and sees cardiology for this. I advsied the pt that he shuodl call cardiology or go to ER for eval. Pt states he rather call cardiology first. Pt denies nausea, headache. Carron Curie CMA  November 25, 2010 9:32 AM

## 2010-12-10 ENCOUNTER — Encounter: Payer: Self-pay | Admitting: Pulmonary Disease

## 2010-12-22 NOTE — Consult Note (Signed)
Summary: [facility]   Imported By: Sherian Rein 12/15/2010 12:53:38  _____________________________________________________________________  External Attachment:    Type:   Image     Comment:   External Document

## 2011-01-05 LAB — POCT HEMOGLOBIN-HEMACUE: Hemoglobin: 15.4 g/dL (ref 13.0–17.0)

## 2011-01-07 DIAGNOSIS — Z9289 Personal history of other medical treatment: Secondary | ICD-10-CM

## 2011-01-07 HISTORY — DX: Personal history of other medical treatment: Z92.89

## 2011-01-12 NOTE — Consult Note (Signed)
Summary: [facility]  [facility]   Imported By: Sherian Rein 01/07/2011 08:37:51  _____________________________________________________________________  External Attachment:    Type:   Image     Comment:   External Document

## 2011-03-12 NOTE — Op Note (Signed)
Jerry Horton, Jerry Horton              ACCOUNT NO.:  [id number]   MEDICAL RECORD NO.:  [id number]          PATIENT TYPE:  AMB   LOCATION:  DAY                          FACILITY:  [facility]   PHYSICIAN:  Marlowe Kays, M.D.  DATE OF BIRTH:  Dec 21, 1960   DATE OF PROCEDURE:  02/17/2007  DATE OF DISCHARGE:                               OPERATIVE REPORT   PREOPERATIVE DIAGNOSES:  1. Suspected posterior labral tear.  2. Chronic impingement syndrome with rotator cuff tendinopathy, right      shoulder.   POSTOPERATIVE DIAGNOSES:  1. Normal labrum.  2. Chronic impingement syndrome with rotator cuff tendinopathy, right      shoulder.   OPERATION:  1. Right shoulder arthroscopy with evaluation of the glenohumeral      joint.  2. Arthroscopic subacromial decompression with shaving of bursal      surface of rotator cuff.   SURGEON:  Marlowe Kays, M.D.   ASSISTANT:  Mr. Adrian Blackwater.   ANESTHESIA:  General anesthesia.   PATHOLOGY AND JUSTIFICATION OF PROCEDURE:  Painful right shoulder.  He  also has cervical disk disease.  He does seem have a separate entity  regarding his shoulder and has had a planned MRI and MRI arthrogram  which have shown what appeared to be initially a substantial tear of the  posterior labrum on the MRI, but on the MR arthrogram a labral  disruption without any significant tear and also chronic tendinopathy  with some asymptomatic osteoarthritis of the [facility] joint.  Accordingly, he  is here today for the above-mentioned surgical procedure.   PROCEDURE:  After satisfactory general anesthesia, beach-chair position  on the Allen frame, right shoulder girdle was prepped with DuraPrep and  draped in sterile field.  Anatomy of the shoulder joint was marked out  and the subacromial space and lateral and posterior portals infiltrated  with 0.50% Marcaine with adrenalin.  Through posterior soft spot portal,  I atraumatically entered the glenohumeral joint.  There was some  very  minimal disruption of the labrum anterior to the biceps anchor  posteriorly on multiple views and we obtained good look at this.  There  did not appear to be any abnormality of the posterior labrum whatsoever.  Documentary pictures were taken.  Rotator cuff, biceps tendon and  humeral head were all unremarkable.  Consequently I then redirected the  scope in the subacromial space and found he had significant impingement  problem with some abrasion injury to the rotator cuff.  I first cleared  up all of the soft tissue irritation and bursal tissue with a 4.2 shaver  through a lateral portal and then used the ArthroCare 90 degree  vaporizer to begin removing soft tissue from the underneath surface of  the acromion.  During part of this procedure, Mr. Idolina Primer applied  downward traction on the arm so that we had some working room in the  subacromial space.  Following use of the vaporizer, I brought in the 4  mm oval bur and began burring down the acromion back to the clavicle  which did not seem to be a deforming factor.  I went back-and-forth  between the three instruments until we had a nice wide subacromial  decompression with no apparent bleeding.  Representative documentary  pictures were taken with the arm to the side and arm abducted.  All  fluid possible was removed from the subacromial space.  The two portals  were closed with 4-0 nylon and infiltrated along with the subacromial  space once again 0.50% Marcaine with adrenaline.  Betadine and Adaptic  dry sterile dressing and shoulder immobilizer were applied.  He  tolerated the procedure well and was taken to the recovery room in  satisfactory condition with no known complication.           ______________________________  Marlowe Kays, M.D.     JA/MEDQ  D:  02/17/2007  T:  02/17/2007  Job:  [id number]

## 2011-07-19 ENCOUNTER — Other Ambulatory Visit: Payer: Self-pay | Admitting: Pulmonary Disease

## 2011-07-19 LAB — INFLUENZA A AND B ANTIGEN (CONVERTED LAB)
Inflenza A Ag: NEGATIVE
Influenza B Ag: NEGATIVE

## 2011-07-21 ENCOUNTER — Encounter: Payer: Self-pay | Admitting: Pulmonary Disease

## 2011-07-22 ENCOUNTER — Encounter: Payer: Self-pay | Admitting: Pulmonary Disease

## 2011-07-22 ENCOUNTER — Ambulatory Visit (INDEPENDENT_AMBULATORY_CARE_PROVIDER_SITE_OTHER): Payer: 59 | Admitting: Pulmonary Disease

## 2011-07-22 VITALS — BP 118/74 | HR 78 | Temp 97.5°F | Ht 69.5 in | Wt 178.4 lb

## 2011-07-22 DIAGNOSIS — J45909 Unspecified asthma, uncomplicated: Secondary | ICD-10-CM

## 2011-07-22 DIAGNOSIS — R0789 Other chest pain: Secondary | ICD-10-CM

## 2011-07-22 DIAGNOSIS — M545 Low back pain, unspecified: Secondary | ICD-10-CM

## 2011-07-22 DIAGNOSIS — R002 Palpitations: Secondary | ICD-10-CM

## 2011-07-22 DIAGNOSIS — R0989 Other specified symptoms and signs involving the circulatory and respiratory systems: Secondary | ICD-10-CM

## 2011-07-22 DIAGNOSIS — K3189 Other diseases of stomach and duodenum: Secondary | ICD-10-CM

## 2011-07-22 DIAGNOSIS — R0600 Dyspnea, unspecified: Secondary | ICD-10-CM

## 2011-07-22 DIAGNOSIS — F411 Generalized anxiety disorder: Secondary | ICD-10-CM

## 2011-07-22 DIAGNOSIS — J383 Other diseases of vocal cords: Secondary | ICD-10-CM

## 2011-07-22 DIAGNOSIS — R0609 Other forms of dyspnea: Secondary | ICD-10-CM

## 2011-07-22 DIAGNOSIS — R1013 Epigastric pain: Secondary | ICD-10-CM

## 2011-07-22 MED ORDER — FLUTICASONE-SALMETEROL 250-50 MCG/DOSE IN AEPB: 1.0000 | INHALATION_SPRAY | Freq: Two times a day (BID) | RESPIRATORY_TRACT | Status: DC

## 2011-07-22 MED ORDER — ALBUTEROL SULFATE HFA 108 (90 BASE) MCG/ACT IN AERS: 2.0000 | INHALATION_SPRAY | Freq: Four times a day (QID) | RESPIRATORY_TRACT | Status: DC | PRN

## 2011-07-22 MED ORDER — HYDROXYZINE PAMOATE 25 MG PO CAPS: 25.0000 mg | ORAL_CAPSULE | Freq: Three times a day (TID) | ORAL | Status: AC | PRN

## 2011-07-22 NOTE — Progress Notes (Signed)
Subjective:    Patient ID: Jerry Horton, male    DOB: 10-Aug-1961, 50 y.o.   MRN: [id number]  HPI 50 y/o BM here for a follow up visit... he has Asthma & allergies and a hx of VCD... he is also followed by DrBerry for [facility]- prev neg cardiac eval from [facility]...  ~  June 25, 2009:  he reports doing well without new complaints or concerns... he cut the Advair100 to once daily & hasn't needed the Proair rescue... he has some plantar fascitis & saw podiatry w/ shot given...  ~  June 24, 2010:  he's had a good yr- no new complaints or concerns... no asthma flairs & continues Advair & Proair Prn... denies CP, palpit, SOB, etc... no GI or GU complaints... but he does note some hand problems> left hypothenar/ palmar pain ?early dupytren's scar tissue & right thumb IP joint swelling/ tender> advised Aleve Rx, hot soaks, and appt w/ DrSypher [facility])...  ~  July 22, 2011: 2mo ROV & he returns due to recurrent breathing problem> he feels that his breathing capacity isn't what it used to be, not breathing as good as he used to, but not really SOB he says> hard for him to describe, states he doesn't have the breath to sing properly, notes an odd feeling in his throat if he jogs, & he's worried all this is due to something other than his asthma;  He does note the difficulty is more a prob getting the air "in" as opposed to "out" ie can't get a deep breath, but he denies nerves/ anxiety (except for anxiety over this problem);  He also c/o a "knot" pointing to the left clavicular head & notes an asymetry to the other side (exam is neg- clavicle feels normal);  He also saw DrHilty, [facility] for palpit w/ eval and "no cause found";  He's also had some incr LBP w/ additional eval from DrAplington w/ another MRI done...  We discussed all these issues and decided on further eval & a treatment plan ==> EVAL: check CXR, PFT, Labs, & CTChest for completeness;  RX: increase his ADVAIR to 250/50 Bid, add  HYDROXYZINE 25mg  Tid, and ROV in 6-8 wks...    AR & Asthma> hx mod airflow obstruction in past; supposed to be on Advair100Bid but only uses it once/d he says; also has hx VCD w/ prev treatment w/ Hydroxyzine which helped his symptoms.Marland KitchenMarland Kitchen    ?pulm nodule> old films w/ ?9mm nodule right mid zone; subseq films didn't show the lesion; pending CTChest will help delineate...    Hx CP/ Palpit> he was evaluated by [facility] 2/12 for atypCP that occurred after rotator cuff surg by DrAplington; standard treadmill test was abnormal & he had a hypertensive BP response (but good exercise capacity noted); he was to return for a Myoview scan> ?if this was done... He reports "no cause found" for palpit.    Dyspepsia> prev used Prilosec OTC as needed; he denies recent reflux symptoms, abd pain, etc...    GU>  He was seen 11/11 by DrTannenbaum for hx of pain in penis, left testis, & left groin area; noted to have Low-T (declined Rx) & ED (Staxyn prn)...    LBP> Followed by DrAplington for Ortho- he had right shoulder arthroscopy 4/08; and more recently resection distal clavicle head & part of acromion on 12/11; he tells me that he just had another MRI of his Lumbar spine & results are pending; now he is concerned regarding some asymmetry  of his prox clavicular heads- he thinks the left is more prominent; my exam shows this area to appear wnl in my opinion; the CT Chest should allay his anxiety...    Anxiety> he does not endorse stress or anxiety as a cause of his unusual dyspnea; we discussed Rx w/ Hydroxyzine Pamoate 25mg  Tid (he used this prev for his VCD w/ improvement).          Problem List:  ALLERGIC RHINITIS (ICD-477.9) - he uses OTC Zyrtek as needed... ~  allergy testing in 1997 by DrSharma showed 4+ reactions to trees, grass, mold, dust, etc... ~  CT Sinuses 9/06 was neg/ WNL...  ASTHMA (ICD-493.90) & Hx of VOCAL CORD DISORDER (ICD-478.5) - on ADVAIR 100Bid (only using once per day), & PROAIR as needed... ~   baseline CXR shows clear lungs, NAD... ~  prev PFT's reveal mod airflow obstruction... ~  9/12:  He presented w/ dyspnea- hard for him to describe ==> eval in progress ~  PFT 9/12 showed FVC=4.12 (99%), FEV1=2.58 (77%), %1sec=63, mid-flows=36%pred; Advair incr to 250Bid. ~  CXR 9/12 ==> pending ~  CTChest 9/12 ==> pending  ? of PULMONARY NODULE (ICD-518.89) - old films w/ ? 9mm nodule right mid lung zone superimposed on post aspect of 7th rib... no change serially... ~  CXR 9/10 showed stable 9mm RUL nodule- no change. ~  CXR 8/11 showed clear lungs, no mention of the prev nodule, NAD.Marland Kitchen. ~  CXR & CTChest 9/12 ==> pending  Hx of CHEST PAIN, ATYPICAL (ICD-786.59) & Hx of PALPITATIONS (ICD-785.1) - he is followed by [facility]- prev DrMcQueen & then DrBerry, & now DrHilty... ? MVP by Echo in 2000... neg Cardiolite 2002... CC is palpit that are worse w/ caffeine, choc, etc... ~  baseline EKG shows NSR, WNL.Marland Kitchen. ~  full eval by Cards 2008 w/ Holter, 2DEcho, Myoview- all normal... ~  Myoview 3/08 was normal- no ischemia, no infarction, EF= 67%... ~  8/11: denies CP, palpit, SOB, etc... exerc at [facility] & doing satis. ~  2/12: he developed some CWP after rotator cuff surg by DrAplington & saw DrHilty for Cards eval; he had an abn standard bruce protocol treadmill test & was supposed to have a Myoview done after that> ?if done, ?results...  Hx of DYSPEPSIA (ICD-536.8) - uses OMEPRAZOLE 20mg  as needed... prev evals by DrPerry... ~  last EGD 11/03 was WNL...  GU > Followed by DrTannenbaum for hx of pain in penis, left testis, & left groin area; noted to have Low-T (declined Rx) & ED (Staxyn prn)...  Right Shoulder Pain > Ortho eval by DrAplington w/ 2 prev surg: he had right shoulder arthroscopy 4/08; and more recently resection distal clavicle head & part of acromion on 12/11...  BACK PAIN, LUMBAR (ICD-724.2) - eval 7/09 from DrAplington w/ MRI L/S spine showing L2 S1 disc bulging without compression... he  still c/o discomfort in the muscles in his left leg... he will f/u w/ Ortho & Neuro for this...  ANXIETY (ICD-300.00) - he was perscribed some Hydroxyzine in the past...  Family Hx of OTH SYMPTOMS INVLV NERV&MUSCULOSKELETAL SYSTEMS (ICD-781.99) - he has a brohter w/ ?myotonia, and a grandparent w/ hx of myasthenia gravis... eval by DrWillis in 2007 was neg- pt was concerned about some fatigue... ~  EMG/ NCV 4/07 by DrWillis was totally normal w/o neuropathy or muscle disorder...   Past Surgical History  Procedure Date  . Shoulder arthroscopy 01/2007    DrAplington  . Resection of distal right  clavicle & acromion 09/2010    DrAplington    Outpatient Encounter Prescriptions as of 07/22/2011  Medication Sig Dispense Refill  . albuterol (PROAIR HFA) 108 (90 BASE) MCG/ACT inhaler Inhale 2 puffs into the lungs every 6 (six) hours as needed.  1 Inhaler  11  . cetirizine (ZYRTEC) 10 MG tablet Take 10 mg by mouth daily.        . Fluticasone-Salmeterol (ADVAIR DISKUS) 250-50 MCG/DOSE AEPB Inhale 1 puff into the lungs 2 (two) times daily.  1 each  11  . hydrOXYzine (VISTARIL) 25 MG capsule Take 1 capsule (25 mg total) by mouth 3 (three) times daily as needed.  90 capsule  5    No Known Allergies   Current Medications, Allergies, Past Medical History, Past Surgical History, Family History, and Social History were reviewed in [employer] record.    Review of Systems       The patient denies fever, chills, sweats, anorexia, fatigue, weakness, malaise, weight loss, sleep disorder, blurring, diplopia, eye irritation, eye discharge, vision loss, eye pain, photophobia, earache, ear discharge, tinnitus, decreased hearing, nasal congestion, nosebleeds, sore throat, hoarseness, chest pain, palpitations, syncope, dyspnea on exertion, orthopnea, PND, peripheral edema, cough, dyspnea at rest, excessive sputum, hemoptysis, wheezing, pleurisy, nausea, vomiting, diarrhea, constipation,  change in bowel habits, abdominal pain, melena, hematochezia, jaundice, gas/bloating, indigestion/heartburn, dysphagia, odynophagia, dysuria, hematuria, urinary frequency, urinary hesitancy, nocturia, incontinence, back pain, joint pain, joint swelling, muscle cramps, muscle weakness, stiffness, arthritis, sciatica, restless legs, leg pain at night, leg pain with exertion, rash, itching, dryness, suspicious lesions, paralysis, paresthesias, seizures, tremors, vertigo, transient blindness, frequent falls, frequent headaches, difficulty walking, depression, anxiety, memory loss, confusion, cold intolerance, heat intolerance, polydipsia, polyphagia, polyuria, unusual weight change, abnormal bruising, bleeding, enlarged lymph nodes, urticaria, allergic rash, hay fever, and recurrent infections.     Objective:   Physical Exam    WD, WN, 49 y/o BM in NAD... GENERAL:  Alert & oriented; pleasant & cooperative... HEENT:  [place]/AT, EOM-wnl, PERRLA, Fundi-benign, EACs-clear, TMs-wnl, NOSE-clear, THROAT-clear & wnl. NECK:  Supple w/ full ROM; no JVD; normal carotid impulses w/o bruits; no thyromegaly or nodules palpated; no lymphadenopathy. CHEST:  Clear to P & A; without wheezes/ rales/ or rhonchi heard... HEART:  Regular Rhythm; without murmurs/ rubs/ or gallops detected... ABDOMEN:  Soft & nontender; normal bowel sounds; no organomegaly or masses palpated... RECTAL:  Neg - prostate 3+ & nontender w/o nodules; stool hematest neg. EXT: without deformities or arthritic changes; no varicose veins/ venous insuffic/ or edema... some swelling & tender in IP joint of right thumb. NEURO:  CN's intact; motor testing normal; sensory testing normal; gait normal & balance OK. DERM:  No lesions noted; no rash etc...  CXR:  ==> pending PFT:   FVC=4.12 (99%), FEV1=2.58 (77%), %1sec=63, mid-flows=36%pred... LABS:   ==> pending CTChest:  ==> pending   Assessment & Plan:   AR & Asthma> PFT confirms mod airflow  obstruction; Rec to increase ADVAIR to 250Bid regularly; also has hx VCD, now w/ unusual dyspnea being further investigated> suggest starting HYDROXYZINE PAMOATE 25mg  Tid regularly & observe response...     ?pulm nodule> old films w/ ?9mm nodule right mid zone; subseq films didn't show the lesion; pending CTChest will help delineate...     Hx CP/ Palpit> he was [facility] by [facility] for atypCP that occurred after shoulder surg by DrAplington; standard treadmill test was abnormal & he had a hypertensive BP response (but good exercise capacity noted); he was to  return for a Myoview scan> ?if this was done... He reports "no cause found" for palpit.     Dyspepsia> prev used Prilosec OTC as needed; he denies recent reflux symptoms, abd pain, etc...     GU>  He was seen 11/11 by DrTannenbaum for hx of pain in penis, left testis, & left groin area; noted to have Low-T (declined Rx) & ED (Staxyn prn)...     LBP> Followed by DrAplington for Ortho- he had right shoulder arthroscopy 4/08; and more recently resection distal clavicle head & part of acromion on 12/11; he tells me that he just had another MRI of his Lumbar spine & results are pending; now he is concerned regarding some asymmetry of his prox clavicular heads- he thinks the left is more prominent; my exam shows this area to appear wnl in my opinion; the CT Chest should allay his anxiety...     Anxiety> he does not endorse stress or anxiety as a cause of his unusual dyspnea; we discussed Rx w/ Hydroxyzine Pamoate 25mg  Tid (he used this prev for his VCD w/ improvement).

## 2011-07-22 NOTE — Patient Instructions (Signed)
Today we updated your med list in EPIC...  Today we did a screening PFT, CXR, & blood work...    Please call the [facility]REE in a few days for your results...    Dial [id number] & when prompted enter your patient number followed by the # symbol...    Your patient number is:   [id number]#  Based on your PFT we decided to increase your ADVAIR to the 250/50- still one puff twice daily...    and we added HYDROXYZINE (as we used several yrs ago) 25mg - take 1 tab up to 3 times daily     (this helps relax the chest wall muscles & allows for a deeper more satisfying breath)...  We will sched a CT scan of your chest for completeness to be sure there is nothing else going on in there...    We will call you w/ this report when available...  Let's plan a follow up visit in 6-8 weeks to re-assess.Marland KitchenMarland Kitchen

## 2011-07-23 ENCOUNTER — Encounter: Payer: Self-pay | Admitting: Pulmonary Disease

## 2011-07-24 ENCOUNTER — Encounter: Payer: Self-pay | Admitting: Pulmonary Disease

## 2011-07-26 ENCOUNTER — Ambulatory Visit (INDEPENDENT_AMBULATORY_CARE_PROVIDER_SITE_OTHER)
Admission: RE | Admit: 2011-07-26 | Discharge: 2011-07-26 | Disposition: A | Discharge status: Home / Self Care | Payer: 59 | Source: Ambulatory Visit | Attending: Pulmonary Disease | Admitting: Pulmonary Disease

## 2011-07-26 ENCOUNTER — Other Ambulatory Visit (INDEPENDENT_AMBULATORY_CARE_PROVIDER_SITE_OTHER): Payer: 59

## 2011-07-26 DIAGNOSIS — J45909 Unspecified asthma, uncomplicated: Secondary | ICD-10-CM

## 2011-07-26 DIAGNOSIS — K3189 Other diseases of stomach and duodenum: Secondary | ICD-10-CM

## 2011-07-26 DIAGNOSIS — R002 Palpitations: Secondary | ICD-10-CM

## 2011-07-26 DIAGNOSIS — R1013 Epigastric pain: Secondary | ICD-10-CM

## 2011-07-26 DIAGNOSIS — F411 Generalized anxiety disorder: Secondary | ICD-10-CM

## 2011-07-26 DIAGNOSIS — R0789 Other chest pain: Secondary | ICD-10-CM

## 2011-07-26 LAB — CBC WITH DIFFERENTIAL/PLATELET
Basophils Absolute: 0 10*3/uL (ref 0.0–0.1)
Basophils Relative: 0.6 % (ref 0.0–3.0)
Eosinophils Absolute: 0.1 10*3/uL (ref 0.0–0.7)
Eosinophils Relative: 3.2 % (ref 0.0–5.0)
HCT: 43.3 % (ref 39.0–52.0)
Hemoglobin: 14.7 g/dL (ref 13.0–17.0)
Lymphocytes Relative: 27.4 % (ref 12.0–46.0)
Lymphs Abs: 1.2 10*3/uL (ref 0.7–4.0)
MCHC: 33.8 g/dL (ref 30.0–36.0)
MCV: 96 fl (ref 78.0–100.0)
Monocytes Absolute: 0.5 10*3/uL (ref 0.1–1.0)
Monocytes Relative: 10.9 % (ref 3.0–12.0)
Neutro Abs: 2.6 10*3/uL (ref 1.4–7.7)
Neutrophils Relative %: 57.9 % (ref 43.0–77.0)
Platelets: 196 10*3/uL (ref 150.0–400.0)
RBC: 4.51 Mil/uL (ref 4.22–5.81)
RDW: 13.3 % (ref 11.5–14.6)
WBC: 4.4 10*3/uL — ABNORMAL LOW (ref 4.5–10.5)

## 2011-07-26 LAB — BASIC METABOLIC PANEL
BUN: 16 mg/dL (ref 6–23)
CO2: 30 mEq/L (ref 19–32)
Calcium: 9.1 mg/dL (ref 8.4–10.5)
Chloride: 103 mEq/L (ref 96–112)
Creatinine, Ser: 1.2 mg/dL (ref 0.4–1.5)
GFR: 85.28 mL/min (ref >60.00)
Glucose, Bld: 90 mg/dL (ref 70–99)
Potassium: 4.7 mEq/L (ref 3.5–5.1)
Sodium: 139 mEq/L (ref 135–145)

## 2011-07-26 LAB — HEPATIC FUNCTION PANEL
ALT: 34 U/L (ref 0–53)
AST: 28 U/L (ref 0–37)
Albumin: 4.1 g/dL (ref 3.5–5.2)
Alkaline Phosphatase: 63 U/L (ref 39–117)
Bilirubin, Direct: 0.1 mg/dL (ref 0.0–0.3)
Total Bilirubin: 1.1 mg/dL (ref 0.3–1.2)
Total Protein: 7.1 g/dL (ref 6.0–8.3)

## 2011-07-26 LAB — TSH: TSH: 1.85 u[IU]/mL (ref 0.35–5.50)

## 2011-07-26 MED ORDER — IOHEXOL 300 MG/ML  SOLN
80.0000 mL | Freq: Once | INTRAMUSCULAR | Status: AC | PRN
  Administered 2011-07-26: 80 mL via INTRAVENOUS

## 2011-09-20 ENCOUNTER — Ambulatory Visit (INDEPENDENT_AMBULATORY_CARE_PROVIDER_SITE_OTHER): Payer: 59 | Admitting: Pulmonary Disease

## 2011-09-20 ENCOUNTER — Encounter: Payer: Self-pay | Admitting: Pulmonary Disease

## 2011-09-20 VITALS — BP 118/70 | HR 77 | Temp 98.0°F | Ht 69.5 in | Wt 178.2 lb

## 2011-09-20 DIAGNOSIS — R911 Solitary pulmonary nodule: Secondary | ICD-10-CM | POA: Insufficient documentation

## 2011-09-20 DIAGNOSIS — J984 Other disorders of lung: Secondary | ICD-10-CM

## 2011-09-20 DIAGNOSIS — J45909 Unspecified asthma, uncomplicated: Secondary | ICD-10-CM

## 2011-09-20 DIAGNOSIS — R1013 Epigastric pain: Secondary | ICD-10-CM

## 2011-09-20 DIAGNOSIS — K3189 Other diseases of stomach and duodenum: Secondary | ICD-10-CM

## 2011-09-20 DIAGNOSIS — J0100 Acute maxillary sinusitis, unspecified: Secondary | ICD-10-CM

## 2011-09-20 MED ORDER — METHYLPREDNISOLONE ACETATE 80 MG/ML IJ SUSP
80.0000 mg | Freq: Once | INTRAMUSCULAR | Status: AC
  Administered 2011-09-20: 80 mg via INTRAMUSCULAR

## 2011-09-20 MED ORDER — PREDNISONE 20 MG PO TABS: ORAL_TABLET | ORAL | Status: DC

## 2011-09-20 NOTE — Progress Notes (Signed)
Subjective:    Patient ID: Jerry Horton, male    DOB: 12-31-1960, 50 y.o.   MRN: [id number]  HPI 50 y/o BM here for a follow up visit... he has Asthma & allergies and a hx of VCD... he is also followed by DrBerry for [facility]- prev neg cardiac eval from [facility]...  ~  July 22, 2011: 342mo ROV & he returns due to recurrent breathing problem> he feels that his breathing capacity isn't what it used to be, not breathing as good as he used to, but not really SOB he says> hard for him to describe, states he doesn't have the breath to sing properly, notes an odd feeling in his throat if he jogs, & he's worried all this is due to something other than his asthma;  He does note the difficulty is more a prob getting the air "in" as opposed to "out" ie can't get a deep breath, but he denies nerves/ anxiety (except for anxiety over this problem);  He also c/o a "knot" pointing to the left clavicular head & notes an asymetry to the other side (exam is neg- clavicle feels normal);  He also saw DrHilty, [facility] for palpit w/ eval and "no cause found";  He's also had some incr LBP w/ additional eval from DrAplington w/ another MRI done...  We discussed all these issues and decided on further eval & a treatment plan ==> EVAL: check CXR, PFT, Labs, & CTChest for completeness;  RX: increase his ADVAIR to 250/50 Bid, add HYDROXYZINE 25mg  Tid, and ROV in 6-8 wks...    AR & Asthma> hx mod airflow obstruction in past; supposed to be on Advair100Bid but only uses it once/d he says; also has hx VCD w/ prev treatment w/ Hydroxyzine which helped his symptoms.Marland KitchenMarland Kitchen    ?pulm nodule> old films w/ ?9mm nodule right mid zone; subseq films didn't show the lesion; pending CTChest will help delineate...    Hx CP/ Palpit> he was evaluated by [facility] 2/12 for atypCP that occurred after rotator cuff surg by DrAplington; standard treadmill test was abnormal & he had a hypertensive BP response (but good exercise capacity noted); he was to return  for a Myoview scan> ?if this was done... He reports "no cause found" for palpit.    Dyspepsia> prev used Prilosec OTC as needed; he denies recent reflux symptoms, abd pain, etc...    GU>  He was seen 11/11 by DrTannenbaum for hx of pain in penis, left testis, & left groin area; noted to have Low-T (declined Rx) & ED (Staxyn prn)...    LBP> Followed by DrAplington for Ortho- he had right shoulder arthroscopy 4/08; and more recently resection distal clavicle head & part of acromion on 12/11; he tells me that he just had another MRI of his Lumbar spine & results are pending; now he is concerned regarding some asymmetry of his prox clavicular heads- he thinks the left is more prominent; my exam shows this area to appear wnl in my opinion; the CT Chest should allay his anxiety...    Anxiety> he does not endorse stress or anxiety as a cause of his unusual dyspnea; we discussed Rx w/ Hydroxyzine Pamoate 25mg  Tid (he used this prev for his VCD w/ improvement).  ~  September 20, 2011:  42mo ROV & last visit his dyspnea seemed multifactorial> related to Asthma w/ %1sec=63 & reduced mid flows, and anxiety over his condition;  We treated him w/ Advair250Bid and Hydroxyzine 25mg  Tid but this made him  too sleepy so he decr to one Qhs;  It seems as thought he prev dyspnea is better as he did not mention those symptoms during today's visit, rather he says he is here due to facial pressure that he has felt over the last month; esp right max sinus area, sl tender on palp, ?worse at night w/ lots of congestion in the AM, sl HA, denies f/c/s;  He tells me he went to the [facility] x2 w/ 2 diff antibiotic given but he doesn't recall the names & didn't bring bottles> he wants ENT referral & we discussed Rx w/ Depo/ Pred 3d taper, Saline nasal mist, Mucinex + Fluids, and refer per request...          Problem List:  ALLERGIC RHINITIS (ICD-477.9) - he uses OTC Zyrtek as needed... ~  allergy testing in 1997 by DrSharma showed 4+  reactions to trees, grass, mold, dust, etc... ~  CT Sinuses 9/06 was neg/ WNL... ~  11/12: presents w/ sinus symptoms having been to [facility] x2 & requesting ENT referral...  ASTHMA (ICD-493.90) & Hx of VOCAL CORD DISORDER (ICD-478.5) - on ADVAIR 250Bid now & PROAIR as needed... ~  baseline CXR shows clear lungs, NAD... ~  prev PFT's reveal mod airflow obstruction... ~  9/12:  He presented w/ dyspnea- hard for him to describe ==>  PFT 9/12 showed FVC=4.12 (99%), FEV1=2.58 (77%), %1sec=63, mid-flows=36%pred; Advair incr to 250Bid.  CXR 9/12 ==> clear, NAD, s/p surg on distal right clavicle...  CTChest 9/12 ==> neg, clear lungs, x 3.31mm RUL nodule noted, no adenopathy, & they rec f/u CT in 45yr to recheck this nodule...  ? of PULMONARY NODULE (ICD-518.89) - old films w/ ? 9mm nodule right mid lung zone superimposed on post aspect of 7th rib... no change serially... ~  CXR 9/10 showed stable 9mm RUL nodule- no change. ~  CXR 8/11 showed clear lungs, no mention of the prev nodule, NAD.Marland Kitchen. ~  CXR & CTChest 9/12 ==> see above> CXR neg (no nodule seen) & CT w/ clear lungs x 3.55mm RUL nodule noted, no adenopathy, & they rec f/u CT in 23yr to recheck this nodule...  Hx of CHEST PAIN, ATYPICAL (ICD-786.59) & Hx of PALPITATIONS (ICD-785.1) - he is followed by [facility]- prev DrMcQueen & then DrBerry, & now DrHilty... ? MVP by Echo in 2000... neg Cardiolite 2002... CC is palpit that are worse w/ caffeine, choc, etc... ~  baseline EKG shows NSR, WNL.Marland Kitchen. ~  full eval by Cards 2008 w/ Holter, 2DEcho, Myoview- all normal... ~  Myoview 3/08 was normal- no ischemia, no infarction, EF= 67%... ~  8/11: denies CP, palpit, SOB, etc... exerc at [facility] & doing satis. ~  2/12: he developed some CWP after rotator cuff surg by DrAplington & saw DrHilty for Cards eval; he had an abn standard bruce protocol treadmill test & was supposed to have a Myoview done after that> ?if done, ?results...  Hx of DYSPEPSIA (ICD-536.8) - uses  OMEPRAZOLE 20mg  as needed... prev evals by DrPerry... ~  last EGD 11/03 was WNL...  GU > Followed by DrTannenbaum for hx of pain in penis, left testis, & left groin area; noted to have Low-T (declined Rx) & ED (Staxyn prn)...  Right Shoulder Pain > Ortho eval by DrAplington w/ 2 prev surg: he had right shoulder arthroscopy 4/08; and more recently resection distal clavicle head & part of acromion on 12/11...  BACK PAIN, LUMBAR (ICD-724.2) - eval 7/09 from DrAplington w/ MRI L/S spine showing  L2 S1 disc bulging without compression... he still c/o discomfort in the muscles in his left leg... he will f/u w/ Ortho & Neuro for this...  ANXIETY (ICD-300.00) - we have rec that he take HYDROXYZINE (Vistaril) 25mg  Tid but it made him too sleepy & he decr to 1 Qhs...  Family Hx of OTH SYMPTOMS INVLV NERV&MUSCULOSKELETAL SYSTEMS (ICD-781.99) - he has a brohter w/ ?myotonia, and a grandparent w/ hx of myasthenia gravis... eval by DrWillis in 2007 was neg- pt was concerned about some fatigue... ~  EMG/ NCV 4/07 by DrWillis was totally normal w/o neuropathy or muscle disorder...   Past Surgical History  Procedure Date  . Shoulder arthroscopy 01/2007    DrAplington  . Resection of distal right clavicle & acromion 09/2010    DrAplington    Outpatient Encounter Prescriptions as of 09/20/2011  Medication Sig Dispense Refill  . albuterol (PROAIR HFA) 108 (90 BASE) MCG/ACT inhaler Inhale 2 puffs into the lungs every 6 (six) hours as needed.  1 Inhaler  11  . Fluticasone-Salmeterol (ADVAIR DISKUS) 250-50 MCG/DOSE AEPB Inhale 1 puff into the lungs 2 (two) times daily.  1 each  11  . cetirizine (ZYRTEC) 10 MG tablet Take 10 mg by mouth daily.          No Known Allergies   Current Medications, Allergies, Past Medical History, Past Surgical History, Family History, and Social History were reviewed in [employer] record.    Review of Systems       The patient denies fever, chills,  sweats, anorexia, fatigue, weakness, malaise, weight loss, sleep disorder, blurring, diplopia, eye irritation, eye discharge, vision loss, eye pain, photophobia, earache, ear discharge, tinnitus, decreased hearing, nasal congestion, nosebleeds, sore throat, hoarseness, chest pain, palpitations, syncope, dyspnea on exertion, orthopnea, PND, peripheral edema, cough, dyspnea at rest, excessive sputum, hemoptysis, wheezing, pleurisy, nausea, vomiting, diarrhea, constipation, change in bowel habits, abdominal pain, melena, hematochezia, jaundice, gas/bloating, indigestion/heartburn, dysphagia, odynophagia, dysuria, hematuria, urinary frequency, urinary hesitancy, nocturia, incontinence, back pain, joint pain, joint swelling, muscle cramps, muscle weakness, stiffness, arthritis, sciatica, restless legs, leg pain at night, leg pain with exertion, rash, itching, dryness, suspicious lesions, paralysis, paresthesias, seizures, tremors, vertigo, transient blindness, frequent falls, frequent headaches, difficulty walking, depression, anxiety, memory loss, confusion, cold intolerance, heat intolerance, polydipsia, polyphagia, polyuria, unusual weight change, abnormal bruising, bleeding, enlarged lymph nodes, urticaria, allergic rash, hay fever, and recurrent infections.     Objective:   Physical Exam    WD, WN, 49 y/o BM in NAD... GENERAL:  Alert & oriented; pleasant & cooperative... HEENT:  [place]/AT, EOM-wnl, PERRLA, Fundi-benign, EACs-clear, TMs-wnl, NOSE-clear, THROAT-clear & wnl. NECK:  Supple w/ full ROM; no JVD; normal carotid impulses w/o bruits; no thyromegaly or nodules palpated; no lymphadenopathy. CHEST:  Clear to P & A; without wheezes/ rales/ or rhonchi heard... HEART:  Regular Rhythm; without murmurs/ rubs/ or gallops detected... ABDOMEN:  Soft & nontender; normal bowel sounds; no organomegaly or masses palpated... RECTAL:  Neg - prostate 3+ & nontender w/o nodules; stool hematest neg. EXT: without  deformities or arthritic changes; no varicose veins/ venous insuffic/ or edema... some swelling & tender in IP joint of right thumb. NEURO:  CN's intact; motor testing normal; sensory testing normal; gait normal & balance OK. DERM:  No lesions noted; no rash etc...   LAB DATA FROM 9/12:   CXR:  ==> clear, NAD, nodule not seen on CXR, s/p surg on distal right clavicle...  PFT:   FVC=4.12 (99%), FEV1=2.58 (  77%), %1sec=63, mid-flows=36%pred...  LABS:   ==> all WNL...  CTChest:  ==> neg, clear lungs x 3.52mm RUL nodule noted, no adenopathy, & they rec f/u CT in 78yr to recheck this nodule...   Assessment & Plan:   Sinusitis>  He presents w/ sinus symptoms but no better after 2 antibiotic per [facility]; doesn't sound infected, rather inflammed & we will Rx w/ Depo/ Pres, Saline, Mucinex, etc; refer to ENT per pt request...  AR & Asthma>  PFT confirmed mod airflow obstruction; now on ADVAIR 250Bid regularly; also has hx VCD, & his unusual dyspnea seems better on HYDROXYZINE PAMOATE 25mg  Tid as needed...     Pulm nodule> old films w/ ?9mm nodule right mid zone; subseq films didn't show the lesion; CT Chest 9/12 w/ 4mm RUL nodule & f/u 63yr is suggested...   Hx CP/ Palpit> he was evaluated by [facility] 2/12 for atypCP that occurred after shoulder surg by DrAplington; standard treadmill test was abnormal & he had a hypertensive BP response (but good exercise capacity noted); he was to return for a Myoview scan> ?if this was done... He reports "no cause found" for palpit.     Dyspepsia> prev used Prilosec OTC as needed; he denies recent reflux symptoms, abd pain, etc...     GU>  He was seen 11/11 by DrTannenbaum for hx of pain in penis, left testis, & left groin area; noted to have Low-T (declined Rx) & ED (Staxyn prn)...     LBP> Followed by DrAplington for Ortho- he had right shoulder arthroscopy 4/08; and more recently resection distal clavicle head & part of acromion on 12/11; he tells me that he just  had another MRI of his Lumbar spine & results are pending; now he is concerned regarding some asymmetry of his prox clavicular heads- he thinks the left is more prominent; my exam shows this area to appear wnl in my opinion; the CT Chest should allay his anxiety...     Anxiety> he does not endorse stress or anxiety as a cause of his unusual dyspnea; we discussed Rx w/ Hydroxyzine Pamoate 25mg  Tid (he used this prev for his VCD w/ improvement).   Patient's Medications  New Prescriptions   PREDNISONE (DELTASONE) 20 MG TABLET    Take 1 tab bid x 3 days, 1 tab daily x 3 days, 1/2 tab daily until gone   SPACER/AERO-HOLDING CHAMBERS (AEROCHAMBER PLUS) INHALER    Use as instructed  Previous Medications   ALBUTEROL (PROAIR HFA) 108 (90 BASE) MCG/ACT INHALER    Inhale 2 puffs into the lungs every 6 (six) hours as needed.   CETIRIZINE (ZYRTEC) 10 MG TABLET    Take 10 mg by mouth daily.     FLUTICASONE-SALMETEROL (ADVAIR DISKUS) 250-50 MCG/DOSE AEPB    Inhale 1 puff into the lungs 2 (two) times daily.  Modified Medications   No medications on file  Discontinued Medications   No medications on file

## 2011-09-20 NOTE — Patient Instructions (Signed)
Today we updated your med list in our EPIC system...    Continue your current medications the same...  For your Sinusitis:    Start the OTC MUCINEX 600mg  tabs- 2 tabs twice daily w/ lots of fluids...    Use the Saline Nasal Mist every 1-2 hours to promote drainage...    We gave you a Depo shot & a new Rx for PREDNISONE to take as directed for the inflammation (see bottle)...  We will arrange for a follow up ENT evaluation from DrCrossley as we discussed...  Call for any questions...  Let's plan a follow up visit in 4-6 months to repeat your CXR & check on your progress.Marland KitchenMarland Kitchen

## 2011-10-01 ENCOUNTER — Telehealth: Payer: Self-pay | Admitting: Pulmonary Disease

## 2011-10-01 MED ORDER — AEROCHAMBER PLUS MISC: Status: DC

## 2011-10-01 NOTE — Telephone Encounter (Signed)
Pt states he does not wear CPAP and needs a new spacer to use with his Proair inhaler. His is old and needs to be replaced. RX for spacer sent to [facility] on [address]

## 2011-10-18 ENCOUNTER — Encounter: Payer: Self-pay | Admitting: Pulmonary Disease

## 2011-12-08 ENCOUNTER — Ambulatory Visit (INDEPENDENT_AMBULATORY_CARE_PROVIDER_SITE_OTHER): Payer: 59 | Admitting: Adult Health

## 2011-12-08 ENCOUNTER — Encounter: Payer: Self-pay | Admitting: Adult Health

## 2011-12-08 DIAGNOSIS — M542 Cervicalgia: Secondary | ICD-10-CM

## 2011-12-08 MED ORDER — HYDROCODONE-ACETAMINOPHEN 5-500 MG PO TABS: 1.0000 | ORAL_TABLET | ORAL | Status: DC | PRN

## 2011-12-08 MED ORDER — METAXALONE 800 MG PO TABS: 800.0000 mg | ORAL_TABLET | Freq: Three times a day (TID) | ORAL | Status: AC

## 2011-12-08 NOTE — Patient Instructions (Signed)
Aleve Twice daily  For 7 days  Skelaxin 800mg  Three times a day  As needed  Muscle spasm.  Vicodin 1-2 every 4-6 hr As needed  Pain-may cause sleepiness.  Alternate ice and heat to neck and shoulder As needed   Please contact office for sooner follow up if symptoms do not improve or worsen or seek emergency care  Drink plenty of fluids

## 2011-12-08 NOTE — Progress Notes (Signed)
Subjective:    Patient ID: Jerry Horton, male    DOB: Dec 04, 1960, 51 y.o.   MRN: [id number]  HPI 51 y/o BM   ~  July 22, 2011: 50yr ROV & he returns due to recurrent breathing problem> he feels that his breathing capacity isn't what it used to be, not breathing as good as he used to, but not really SOB he says> hard for him to describe, states he doesn't have the breath to sing properly, notes an odd feeling in his throat if he jogs, & he's worried all this is due to something other than his asthma;  He does note the difficulty is more a prob getting the air "in" as opposed to "out" ie can't get a deep breath, but he denies nerves/ anxiety (except for anxiety over this problem);  He also c/o a "knot" pointing to the left clavicular head & notes an asymetry to the other side (exam is neg- clavicle feels normal);  He also saw DrHilty, [facility] for palpit w/ eval and "no cause found";  He's also had some incr LBP w/ additional eval from DrAplington w/ another MRI done...  We discussed all these issues and decided on further eval & a treatment plan ==> EVAL: check CXR, PFT, Labs, & CTChest for completeness;  RX: increase his ADVAIR to 250/50 Bid, add HYDROXYZINE 25mg  Tid, and ROV in 6-8 wks...    AR & Asthma> hx mod airflow obstruction in past; supposed to be on Advair100Bid but only uses it once/d he says; also has hx VCD w/ prev treatment w/ Hydroxyzine which helped his symptoms.Marland KitchenMarland Kitchen    ?pulm nodule> old films w/ ?9mm nodule right mid zone; subseq films didn't show the lesion; pending CTChest will help delineate...    Hx CP/ Palpit> he was evaluated by [facility] 2/12 for atypCP that occurred after rotator cuff surg by DrAplington; standard treadmill test was abnormal & he had a hypertensive BP response (but good exercise capacity noted); he was to return for a Myoview scan> ?if this was done... He reports "no cause found" for palpit.    Dyspepsia> prev used Prilosec OTC as needed; he denies recent reflux  symptoms, abd pain, etc...    GU>  He was seen 11/11 by DrTannenbaum for hx of pain in penis, left testis, & left groin area; noted to have Low-T (declined Rx) & ED (Staxyn prn)...    LBP> Followed by DrAplington for Ortho- he had right shoulder arthroscopy 4/08; and more recently resection distal clavicle head & part of acromion on 12/11; he tells me that he just had another MRI of his Lumbar spine & results are pending; now he is concerned regarding some asymmetry of his prox clavicular heads- he thinks the left is more prominent; my exam shows this area to appear wnl in my opinion; the CT Chest should allay his anxiety...    Anxiety> he does not endorse stress or anxiety as a cause of his unusual dyspnea; we discussed Rx w/ Hydroxyzine Pamoate 25mg  Tid (he used this prev for his VCD w/ improvement).  ~  September 20, 2011:  37mo ROV & last visit his dyspnea seemed multifactorial> related to Asthma w/ %1sec=63 & reduced mid flows, and anxiety over his condition;  We treated him w/ Advair250Bid and Hydroxyzine 25mg  Tid but this made him too sleepy so he decr to one Qhs;  It seems as thought he prev dyspnea is better as he did not mention those symptoms during today's visit, rather  he says he is here due to facial pressure that he has felt over the last month; esp right max sinus area, sl tender on palp, ?worse at night w/ lots of congestion in the AM, sl HA, denies f/c/s;  He tells me he went to the [facility] x2 w/ 2 diff antibiotic given but he doesn't recall the names & didn't bring bottles> he wants ENT referral & we discussed Rx w/ Depo/ Pred 3d taper, Saline nasal mist, Mucinex + Fluids, and refer per request...  12/08/2011 Acute OV  Complains of pain in right side of neck with radiates down in arm, hand cramps in right hand.  Gone back to gym doing a lot of lifting. Pain recently with lifting in neck. Been doing a lot of bench presses, lifting over his head and leg dips. No extremity weakness , joint  swelling , rash, fever or chest pain . Took a few aleve without much help. Started around 2 weeks ago.          Problem List:  ALLERGIC RHINITIS (ICD-477.9) - he uses OTC Zyrtek as needed... ~  allergy testing in 1997 by DrSharma showed 4+ reactions to trees, grass, mold, dust, etc... ~  CT Sinuses 9/06 was neg/ WNL... ~  11/12: presents w/ sinus symptoms having been to [facility] x2 & requesting ENT referral...  ASTHMA (ICD-493.90) & Hx of VOCAL CORD DISORDER (ICD-478.5) - on ADVAIR 250Bid now & PROAIR as needed... ~  baseline CXR shows clear lungs, NAD... ~  prev PFT's reveal mod airflow obstruction... ~  9/12:  He presented w/ dyspnea- hard for him to describe ==>  PFT 9/12 showed FVC=4.12 (99%), FEV1=2.58 (77%), %1sec=63, mid-flows=36%pred; Advair incr to 250Bid.  CXR 9/12 ==> clear, NAD, s/p surg on distal right clavicle...  CTChest 9/12 ==> neg, clear lungs, x 3.59mm RUL nodule noted, no adenopathy, & they rec f/u CT in 50yr to recheck this nodule...  ? of PULMONARY NODULE (ICD-518.89) - old films w/ ? 9mm nodule right mid lung zone superimposed on post aspect of 7th rib... no change serially... ~  CXR 9/10 showed stable 9mm RUL nodule- no change. ~  CXR 8/11 showed clear lungs, no mention of the prev nodule, NAD.Marland Kitchen. ~  CXR & CTChest 9/12 ==> see above> CXR neg (no nodule seen) & CT w/ clear lungs x 3.39mm RUL nodule noted, no adenopathy, & they rec f/u CT in 50yr to recheck this nodule...  Hx of CHEST PAIN, ATYPICAL (ICD-786.59) & Hx of PALPITATIONS (ICD-785.1) - he is followed by [facility]- prev DrMcQueen & then DrBerry, & now DrHilty... ? MVP by [facility] in 2000... neg Cardiolite 2002... CC is palpit that are worse w/ caffeine, choc, etc... ~  baseline EKG shows NSR, WNL.Marland Kitchen. ~  full eval by Cards 2008 w/ Holter, 2DEcho, Myoview- all normal... ~  Myoview 3/08 was normal- no ischemia, no infarction, EF= 67%... ~  8/11: denies CP, palpit, SOB, etc... exerc at [facility] & doing satis. ~  2/12: he developed  some CWP after rotator cuff surg by DrAplington & saw DrHilty for Cards eval; he had an abn standard bruce protocol treadmill test & was supposed to have a Myoview done after that> ?if done, ?results...  Hx of DYSPEPSIA (ICD-536.8) - uses OMEPRAZOLE 20mg  as needed... prev evals by DrPerry... ~  last EGD 11/03 was WNL...  GU > Followed by DrTannenbaum for hx of pain in penis, left testis, & left groin area; noted to have Low-T (declined Rx) & ED (  Staxyn prn)...  Right Shoulder Pain > Ortho eval by DrAplington w/ 2 prev surg: he had right shoulder arthroscopy 4/08; and more recently resection distal clavicle head & part of acromion on 12/11...  BACK PAIN, LUMBAR (ICD-724.2) - eval 7/09 from DrAplington w/ MRI L/S spine showing L2 S1 disc bulging without compression... he still c/o discomfort in the muscles in his left leg... he will f/u w/ Ortho & Neuro for this...  ANXIETY (ICD-300.00) - we have rec that he take HYDROXYZINE (Vistaril) 25mg  Tid but it made him too sleepy & he decr to 1 Qhs...  Family Hx of OTH SYMPTOMS INVLV NERV&MUSCULOSKELETAL SYSTEMS (ICD-781.99) - he has a brohter w/ ?myotonia, and a grandparent w/ hx of myasthenia gravis... eval by DrWillis in 2007 was neg- pt was concerned about some fatigue... ~  EMG/ NCV 4/07 by DrWillis was totally normal w/o neuropathy or muscle disorder...   Past Surgical History  Procedure Date  . Shoulder arthroscopy 01/2007    DrAplington  . Resection of distal right clavicle & acromion 09/2010    DrAplington    Outpatient Encounter Prescriptions as of 12/08/2011  Medication Sig Dispense Refill  . albuterol (PROAIR HFA) 108 (90 BASE) MCG/ACT inhaler Inhale 2 puffs into the lungs every 6 (six) hours as needed.  1 Inhaler  11  . cetirizine (ZYRTEC) 10 MG tablet Take 10 mg by mouth daily as needed.       . diltiazem (CARDIZEM) 30 MG tablet Take 1 tablet by mouth Once daily as needed.      . Fluticasone-Salmeterol (ADVAIR DISKUS) 250-50 MCG/DOSE  AEPB Inhale 1 puff into the lungs 2 (two) times daily.  1 each  11  . Spacer/Aero-Holding Chambers (AEROCHAMBER PLUS) inhaler Use as instructed  1 each  0  . DISCONTD: predniSONE (DELTASONE) 20 MG tablet Take 1 tab bid x 3 days, 1 tab daily x 3 days, 1/2 tab daily until gone  12 tablet  0    No Known Allergies   Current Medications, Allergies, Past Medical History, Past Surgical History, Family History, and Social History were reviewed in [employer] record.    Review of Systems        Constitutional:   No  weight loss, night sweats,  Fevers, chills, fatigue, or  lassitude.  HEENT:   No headaches,  Difficulty swallowing,  Tooth/dental problems, or  Sore throat,                No sneezing, itching, ear ache, nasal congestion, post nasal drip,   CV:  No chest pain,  Orthopnea, PND, swelling in lower extremities, anasarca, dizziness, palpitations, syncope.   GI  No heartburn, indigestion, abdominal pain, nausea, vomiting, diarrhea, change in bowel habits, loss of appetite, bloody stools.   Resp: No shortness of breath with exertion or at rest.  No excess mucus, no productive cough,  No non-productive cough,  No coughing up of blood.  No change in color of mucus.  No wheezing.  No chest wall deformity  Skin: no rash or lesions.  GU: no dysuria, change in color of urine, no urgency or frequency.  No flank pain, no hematuria   MS:  No joint  swelling.   No back pain.  Psych:  No change in mood or affect. No depression or anxiety.  No memory loss.        Objective:   Physical Exam    WD, WN, 50 y/o BM in NAD... GENERAL:  Alert &  oriented; pleasant & cooperative... HEENT:  [place]/AT, EOM-wnl, PERRLA, Fundi-benign, EACs-clear, TMs-wnl, NOSE-clear, THROAT-clear & wnl. NECK:  Supple w/ full ROM; no JVD; normal carotid impulses w/o bruits; no thyromegaly or nodules palpated; no lymphadenopathy. CHEST:  Clear to P & A; without wheezes/ rales/ or rhonchi  heard... HEART:  Regular Rhythm; without murmurs/ rubs/ or gallops detected... ABDOMEN:  Soft & nontender; normal bowel sounds; no organomegaly or masses palpated...  EXT: without deformities or arthritic changes; no varicose veins/ venous insuffic/ or edema... Tender along right lateral neck, shoulder and subscapular region. ROM nml but extension of right arm is painful.   NEURO:  CN's intact; motor testing normal; sensory testing normal; gait normal & balance OK. nml grips, no focal deficits noted.  DERM:  No lesions noted; no rash etc...    Assessment & Plan:

## 2011-12-09 DIAGNOSIS — M542 Cervicalgia: Secondary | ICD-10-CM | POA: Insufficient documentation

## 2011-12-09 NOTE — Assessment & Plan Note (Signed)
Suspect strain Advised on decreased weight lifting   Plan Aleve Twice daily  For 7 days  Skelaxin 800mg  Three times a day  As needed  Muscle spasm.  Vicodin 1-2 every 4-6 hr As needed  Pain-may cause sleepiness.  Alternate ice and heat to neck and shoulder As needed   Please contact office for sooner follow up if symptoms do not improve or worsen or seek emergency care  Drink plenty of fluids

## 2012-06-21 ENCOUNTER — Other Ambulatory Visit: Payer: Self-pay | Admitting: Orthopedic Surgery

## 2012-07-04 ENCOUNTER — Encounter (HOSPITAL_BASED_OUTPATIENT_CLINIC_OR_DEPARTMENT_OTHER): Payer: Self-pay | Admitting: *Deleted

## 2012-07-04 NOTE — Progress Notes (Signed)
NPO AFTER MN. ARRIVES AT 1030. NEEDS HG AND EKG. CURRENT CHEST CT IN EPIC AND CHART. MAY TO HYDROCODONE IF NEEDED W/ SIPS OF WATER AM OF SURG. WILL BRING ADVAIR INHALER.

## 2012-07-07 ENCOUNTER — Encounter (HOSPITAL_BASED_OUTPATIENT_CLINIC_OR_DEPARTMENT_OTHER)
Admission: RE | Disposition: A | Discharge status: Home / Self Care | Payer: Self-pay | Source: Ambulatory Visit | Attending: Orthopedic Surgery

## 2012-07-07 ENCOUNTER — Ambulatory Visit (HOSPITAL_BASED_OUTPATIENT_CLINIC_OR_DEPARTMENT_OTHER): Payer: 59 | Admitting: Anesthesiology

## 2012-07-07 ENCOUNTER — Encounter (HOSPITAL_BASED_OUTPATIENT_CLINIC_OR_DEPARTMENT_OTHER): Payer: Self-pay | Admitting: Anesthesiology

## 2012-07-07 ENCOUNTER — Encounter (HOSPITAL_BASED_OUTPATIENT_CLINIC_OR_DEPARTMENT_OTHER): Payer: Self-pay | Admitting: *Deleted

## 2012-07-07 ENCOUNTER — Ambulatory Visit (HOSPITAL_BASED_OUTPATIENT_CLINIC_OR_DEPARTMENT_OTHER)
Admission: RE | Admit: 2012-07-07 | Discharge: 2012-07-07 | Disposition: A | Discharge status: Home / Self Care | Payer: 59 | Source: Ambulatory Visit | Attending: Orthopedic Surgery | Admitting: Orthopedic Surgery

## 2012-07-07 DIAGNOSIS — M7710 Lateral epicondylitis, unspecified elbow: Secondary | ICD-10-CM | POA: Insufficient documentation

## 2012-07-07 DIAGNOSIS — J45909 Unspecified asthma, uncomplicated: Secondary | ICD-10-CM | POA: Insufficient documentation

## 2012-07-07 DIAGNOSIS — M249 Joint derangement, unspecified: Secondary | ICD-10-CM | POA: Insufficient documentation

## 2012-07-07 DIAGNOSIS — Z79899 Other long term (current) drug therapy: Secondary | ICD-10-CM | POA: Insufficient documentation

## 2012-07-07 HISTORY — PX: REPAIR EXTENSOR TENDON: SHX5382

## 2012-07-07 HISTORY — DX: Nonrheumatic mitral (valve) prolapse: I34.1

## 2012-07-07 HISTORY — DX: Solitary pulmonary nodule: R91.1

## 2012-07-07 HISTORY — DX: Unspecified asthma, uncomplicated: J45.909

## 2012-07-07 HISTORY — DX: Lateral epicondylitis, right elbow: M77.11

## 2012-07-07 LAB — POCT HEMOGLOBIN-HEMACUE: Hemoglobin: 14.9 g/dL (ref 13.0–17.0)

## 2012-07-07 SURGERY — REPAIR, TENDON, EXTENSOR
Anesthesia: General | Site: Elbow | Laterality: Right | Wound class: Clean

## 2012-07-07 MED ORDER — KETOROLAC TROMETHAMINE 30 MG/ML IJ SOLN: INTRAMUSCULAR | Status: DC | PRN

## 2012-07-07 MED ORDER — FENTANYL CITRATE 0.05 MG/ML IJ SOLN
INTRAMUSCULAR | Status: DC | PRN
  Administered 2012-07-07 (×2): 50 ug via INTRAVENOUS
  Administered 2012-07-07: 25 ug via INTRAVENOUS

## 2012-07-07 MED ORDER — LACTATED RINGERS IV SOLN
INTRAVENOUS | Status: DC
  Administered 2012-07-07 (×2): via INTRAVENOUS

## 2012-07-07 MED ORDER — DEXAMETHASONE SODIUM PHOSPHATE 4 MG/ML IJ SOLN
INTRAMUSCULAR | Status: DC | PRN
  Administered 2012-07-07: 8 mg via INTRAVENOUS

## 2012-07-07 MED ORDER — POVIDONE-IODINE 7.5 % EX SOLN: Freq: Once | CUTANEOUS | Status: DC

## 2012-07-07 MED ORDER — KETOROLAC TROMETHAMINE 30 MG/ML IJ SOLN
INTRAMUSCULAR | Status: DC | PRN
  Administered 2012-07-07: 30 mg via INTRAVENOUS

## 2012-07-07 MED ORDER — OXYCODONE-ACETAMINOPHEN 7.5-325 MG PO TABS: 1.0000 | ORAL_TABLET | ORAL | Status: AC | PRN

## 2012-07-07 MED ORDER — PROPOFOL 10 MG/ML IV BOLUS
INTRAVENOUS | Status: DC | PRN
  Administered 2012-07-07: 180 mg via INTRAVENOUS

## 2012-07-07 MED ORDER — LIDOCAINE HCL (CARDIAC) 20 MG/ML IV SOLN
INTRAVENOUS | Status: DC | PRN
  Administered 2012-07-07: 60 mg via INTRAVENOUS

## 2012-07-07 MED ORDER — METHOCARBAMOL 500 MG PO TABS
500.0000 mg | ORAL_TABLET | Freq: Three times a day (TID) | ORAL | Status: DC
  Administered 2012-07-07: 500 mg via ORAL

## 2012-07-07 MED ORDER — ONDANSETRON HCL 4 MG/2ML IJ SOLN
INTRAMUSCULAR | Status: DC | PRN
  Administered 2012-07-07: 4 mg via INTRAVENOUS

## 2012-07-07 MED ORDER — METHOCARBAMOL 500 MG PO TABS: 500.0000 mg | ORAL_TABLET | Freq: Four times a day (QID) | ORAL | Status: AC

## 2012-07-07 MED ORDER — OXYCODONE-ACETAMINOPHEN 5-325 MG PO TABS
1.0000 | ORAL_TABLET | ORAL | Status: DC | PRN
  Administered 2012-07-07: 1 via ORAL

## 2012-07-07 MED ORDER — OXYCODONE HCL 5 MG PO TABS
2.5000 mg | ORAL_TABLET | ORAL | Status: DC | PRN
  Administered 2012-07-07: 2.5 mg via ORAL

## 2012-07-07 MED ORDER — BUPIVACAINE HCL (PF) 0.5 % IJ SOLN
INTRAMUSCULAR | Status: DC | PRN
  Administered 2012-07-07: 5 mL

## 2012-07-07 MED ORDER — MIDAZOLAM HCL 5 MG/5ML IJ SOLN
INTRAMUSCULAR | Status: DC | PRN
  Administered 2012-07-07: 2 mg via INTRAVENOUS

## 2012-07-07 SURGICAL SUPPLY — 60 items
BANDAGE BSB183 OLD 31 155 4 (GAUZE/BANDAGES/DRESSINGS) IMPLANT
BANDAGE CONFORM 3  STR LF (GAUZE/BANDAGES/DRESSINGS) IMPLANT
BANDAGE ELASTIC 3 VELCRO ST LF (GAUZE/BANDAGES/DRESSINGS) IMPLANT
BANDAGE ELASTIC 6 VELCRO ST LF (GAUZE/BANDAGES/DRESSINGS) ×1 IMPLANT
BLADE SURG 15 STRL LF DISP TIS (BLADE) ×1 IMPLANT
BLADE SURG 15 STRL SS (BLADE) ×2
BNDG CMPR 9X4 STRL LF SNTH (GAUZE/BANDAGES/DRESSINGS) ×1
BNDG ESMARK 4X9 LF (GAUZE/BANDAGES/DRESSINGS) ×2 IMPLANT
CANISTER SUCTION 1200CC (MISCELLANEOUS) ×3 IMPLANT
CANISTER SUCTION 2500CC (MISCELLANEOUS) IMPLANT
CLOTH BEACON ORANGE TIMEOUT ST (SAFETY) ×2 IMPLANT
CORDS BIPOLAR (ELECTRODE) ×1 IMPLANT
COVER TABLE BACK 60X90 (DRAPES) ×2 IMPLANT
DRAPE EXTREMITY T 121X128X90 (DRAPE) ×2 IMPLANT
DRAPE LG THREE QUARTER DISP (DRAPES) ×4 IMPLANT
DRAPE U-SHAPE 47X51 STRL (DRAPES) ×2 IMPLANT
DRSG EMULSION OIL 3X3 NADH (GAUZE/BANDAGES/DRESSINGS) ×2 IMPLANT
DURAPREP 26ML APPLICATOR (WOUND CARE) ×2 IMPLANT
ELECT REM PT RETURN 9FT ADLT (ELECTROSURGICAL)
ELECTRODE REM PT RTRN 9FT ADLT (ELECTROSURGICAL) IMPLANT
GAUZE SPONGE 4X4 12PLY STRL LF (GAUZE/BANDAGES/DRESSINGS) IMPLANT
GLOVE BIO SURGEON STRL SZ 6.5 (GLOVE) ×1 IMPLANT
GLOVE BIOGEL PI IND STRL 7.0 (GLOVE) IMPLANT
GLOVE BIOGEL PI INDICATOR 7.0 (GLOVE) ×1
GLOVE ECLIPSE 6.0 STRL STRAW (GLOVE) ×1 IMPLANT
GLOVE ECLIPSE 8.0 STRL XLNG CF (GLOVE) ×2 IMPLANT
GLOVE INDICATOR 6.5 STRL GRN (GLOVE) ×1 IMPLANT
GLOVE INDICATOR 8.0 STRL GRN (GLOVE) ×3 IMPLANT
GOWN PREVENTION PLUS LG XLONG (DISPOSABLE) ×3 IMPLANT
GOWN STRL REIN XL XLG (GOWN DISPOSABLE) ×2 IMPLANT
NEEDLE 27GAX1X1/2 (NEEDLE) IMPLANT
NEEDLE HYPO 22GX1.5 SAFETY (NEEDLE) ×1 IMPLANT
NS IRRIG 500ML POUR BTL (IV SOLUTION) ×2 IMPLANT
PACK BASIN DAY SURGERY FS (CUSTOM PROCEDURE TRAY) ×2 IMPLANT
PAD CAST 4YDX4 CTTN HI CHSV (CAST SUPPLIES) IMPLANT
PADDING CAST ABS 4INX4YD NS (CAST SUPPLIES) ×1
PADDING CAST ABS COTTON 4X4 ST (CAST SUPPLIES) IMPLANT
PADDING CAST COTTON 4X4 STRL (CAST SUPPLIES) ×2
PENCIL BUTTON HOLSTER BLD 10FT (ELECTRODE) IMPLANT
SPONGE GAUZE 2X2 8PLY STRL LF (GAUZE/BANDAGES/DRESSINGS) IMPLANT
SPONGE GAUZE 4X4 12PLY (GAUZE/BANDAGES/DRESSINGS) ×2 IMPLANT
STOCKINETTE 4X48 STRL (DRAPES) ×1 IMPLANT
STOCKINETTE IMPERVIOUS LG (DRAPES) ×1 IMPLANT
SUCTION FRAZIER TIP 10 FR DISP (SUCTIONS) IMPLANT
SUT ETHILON 4 0 PS 2 18 (SUTURE) ×3 IMPLANT
SUT ETHILON 5 0 PS 2 18 (SUTURE) IMPLANT
SUT VIC AB 0 CT2 27 (SUTURE) ×2 IMPLANT
SUT VIC AB 2-0 SH 27 (SUTURE)
SUT VIC AB 2-0 SH 27XBRD (SUTURE) IMPLANT
SUT VIC AB 2-0 UR6 27 (SUTURE) IMPLANT
SUT VIC AB 3-0 PS2 18 (SUTURE) ×2
SUT VIC AB 3-0 PS2 18XBRD (SUTURE) IMPLANT
SUT VIC AB 4-0 SH 27 (SUTURE)
SUT VIC AB 4-0 SH 27XANBCTRL (SUTURE) IMPLANT
SYR BULB IRRIGATION 50ML (SYRINGE) IMPLANT
SYR CONTROL 10ML LL (SYRINGE) IMPLANT
TOWEL OR 17X24 6PK STRL BLUE (TOWEL DISPOSABLE) ×4 IMPLANT
TUBE CONNECTING 12X1/4 (SUCTIONS) IMPLANT
UNDERPAD 30X30 INCONTINENT (UNDERPADS AND DIAPERS) ×2 IMPLANT
WATER STERILE IRR 500ML POUR (IV SOLUTION) ×1 IMPLANT

## 2012-07-07 NOTE — H&P (Signed)
Jerry Horton is an 51 y.o. male.   Chief Complaint: painful rt elbow HPI: MRI demonstrates tear of common extensor tendon at elbow  Past Medical History  Diagnosis Date  . Allergic rhinitis, cause unspecified   . Palpitations OCCASIONAL  . Anxiety state, unspecified   . Epicondylitis, lateral, right CHRONIC  . MVP (mitral valve prolapse) OCC . PALPITATIONS  . Pulmonary nodule STABLE PER CT  . Asthma, moderate     Past Surgical History  Procedure Date  . Shoulder arthroscopy 01/2007    DrAplington--  RIGHT SHOULDER  . Resection of distal right clavicle & acromion 09/2010    DrAplington  . Open resection distal right clavicle and ac joint arthritis 09-23-2010    DR Simonne Come  . Umbilical hernia repair CHILD    Family History  Problem Relation Age of Onset  . Brain cancer Father   . Hypertension Mother    Social History:  reports that he has never smoked. He has never used smokeless tobacco. He reports that he drinks alcohol. He reports that he does not use illicit drugs.  Allergies: No Known Allergies  Medications Prior to Admission  Medication Sig Dispense Refill  . albuterol (PROAIR HFA) 108 (90 BASE) MCG/ACT inhaler Inhale 2 puffs into the lungs every 6 (six) hours as needed.  1 Inhaler  11  . diltiazem (CARDIZEM) 30 MG tablet Take 1 tablet by mouth Once daily as needed.      . Fluticasone-Salmeterol (ADVAIR DISKUS) 250-50 MCG/DOSE AEPB Inhale 1 puff into the lungs 2 (two) times daily.  1 each  11  . HYDROcodone-acetaminophen (VICODIN) 5-500 MG per tablet Take 1 tablet by mouth every 4 (four) hours as needed for pain.  20 tablet  0    Results for orders placed during the hospital encounter of 07/07/12 (from the past 48 hour(s))  POCT HEMOGLOBIN-HEMACUE     Status: Normal   Collection Time   07/07/12 11:34 AM      Component Value Range Comment   Hemoglobin 14.9  13.0 - 17.0 g/dL    No results found.  ROS  Blood pressure 123/79, pulse 69, temperature 97.7 F  (36.5 C), temperature source Oral, resp. rate 20, height 5\' 10"  (1.778 m), weight 80.428 kg (177 lb 5 oz), SpO2 99.00%. Physical Exam  Constitutional: He is oriented to person, place, and time. He appears well-developed and well-nourished.  HENT:  Head: Normocephalic and atraumatic.  Right Ear: External ear normal.  Left Ear: External ear normal.  Nose: Nose normal.  Mouth/Throat: Oropharynx is clear and moist.  Eyes: Conjunctivae normal and EOM are normal. Pupils are equal, round, and reactive to light.  Neck: Normal range of motion. Neck supple.  Cardiovascular: Normal rate and regular rhythm.   Respiratory: Effort normal and breath sounds normal.  GI: Soft. Bowel sounds are normal.  Musculoskeletal: Normal range of motion. He exhibits tenderness.       Tender lateral epicondyle rt elbow  Neurological: He is alert and oriented to person, place, and time. He has normal reflexes.  Skin: Skin is warm and dry.  Psychiatric: He has a normal mood and affect. His behavior is normal. Judgment and thought content normal.     Assessment/Plan Chronic lateral epicondylitis rt elbow Repair of extensor tendon lateral rt elbow  Maniyah Moller P 07/07/2012, 12:47 PM

## 2012-07-07 NOTE — Brief Op Note (Signed)
07/07/2012  4:29 PM  PATIENT:  Jerry Horton  51 y.o. male  PRE-OPERATIVE DIAGNOSIS:  CHRONIC LATERAL EPICONDYLITIS OF THE RIGHT ELBOW  POST-OPERATIVE DIAGNOSIS:  EPICONDYLITIS OF THE RIGHT ELB  PROCEDURE:  Procedure(s) (LRB) with comments: REPAIR EXTENSOR TENDON (Right) - REPAIR OF COMMON EXTENSOR TENDON WITH PARTIAL LATERAL EPICONDYLECTOMY OF THE RIGHT ELBOW   SURGEON:  Surgeon(s) and Role:    * Drucilla Schmidt, MD - Primary  PHYSICIAN ASSISTANT:   ASSISTANTS: nurse  ANESTHESIA:   general  EBL:  Total I/O In: 1580 [P.O.:480; I.V.:1100] Out: -   BLOOD ADMINISTERED:none  DRAINS: none   LOCAL MEDICATIONS USED:  MARCAINE     SPECIMEN:  No Specimen  DISPOSITION OF SPECIMEN:  N/A  COUNTS:  YES  TOURNIQUET:   Total Tourniquet Time Documented: Upper Arm (Right) - 54 minutes  DICTATION: .Other Dictation: Dictation Number [id number]  PLAN OF CARE: Discharge to home after PACU  PATIENT DISPOSITION:  PACU - hemodynamically stable.   Delay start of Pharmacological VTE agent (>24hrs) due to surgical blood loss or risk of bleeding: yes

## 2012-07-07 NOTE — Anesthesia Preprocedure Evaluation (Signed)
Anesthesia Evaluation  Patient identified by MRN, date of birth, ID band Patient awake    Reviewed: Allergy & Precautions, H&P , NPO status , Patient's Chart, lab work & pertinent test results  Airway Mallampati: II TM Distance: >3 FB Neck ROM: Full    Dental No notable dental hx.    Pulmonary neg pulmonary ROS, asthma ,  breath sounds clear to auscultation  Pulmonary exam normal       Cardiovascular negative cardio ROS  + Valvular Problems/Murmurs MVP Rhythm:Regular Rate:Normal     Neuro/Psych negative neurological ROS  negative psych ROS   GI/Hepatic negative GI ROS, Neg liver ROS,   Endo/Other  negative endocrine ROS  Renal/GU negative Renal ROS  negative genitourinary   Musculoskeletal negative musculoskeletal ROS (+)   Abdominal   Peds negative pediatric ROS (+)  Hematology negative hematology ROS (+)   Anesthesia Other Findings   Reproductive/Obstetrics negative OB ROS                           Anesthesia Physical Anesthesia Plan  ASA: II  Anesthesia Plan: General   Post-op Pain Management:    Induction: Intravenous  Airway Management Planned: LMA  Additional Equipment:   Intra-op Plan:   Post-operative Plan: Extubation in OR  Informed Consent: I have reviewed the patients History and Physical, chart, labs and discussed the procedure including the risks, benefits and alternatives for the proposed anesthesia with the patient or authorized representative who has indicated his/her understanding and acceptance.   Dental advisory given  Plan Discussed with: CRNA  Anesthesia Plan Comments:         Anesthesia Quick Evaluation

## 2012-07-07 NOTE — Anesthesia Procedure Notes (Signed)
Procedure Name: LMA Insertion Date/Time: 07/07/2012 1:17 PM Performed by: Renella Cunas D Pre-anesthesia Checklist: Patient identified, Emergency Drugs available, Suction available and Patient being monitored Patient Re-evaluated:Patient Re-evaluated prior to inductionOxygen Delivery Method: Circle System Utilized Preoxygenation: Pre-oxygenation with 100% oxygen Intubation Type: IV induction Ventilation: Mask ventilation without difficulty LMA: LMA inserted LMA Size: 4.0 Number of attempts: 1 Airway Equipment and Method: bite block Placement Confirmation: positive ETCO2 Tube secured with: Tape Dental Injury: Teeth and Oropharynx as per pre-operative assessment

## 2012-07-07 NOTE — Anesthesia Postprocedure Evaluation (Signed)
  Anesthesia Post-op Note  Patient: Jerry Horton  Procedure(s) Performed: Procedure(s) (LRB): REPAIR EXTENSOR TENDON (Right)  Patient Location: PACU  Anesthesia Type: General  Level of Consciousness: awake and alert   Airway and Oxygen Therapy: Patient Spontanous Breathing  Post-op Pain: mild  Post-op Assessment: Post-op Vital signs reviewed, Patient's Cardiovascular Status Stable, Respiratory Function Stable, Patent Airway and No signs of Nausea or vomiting  Post-op Vital Signs: stable  Complications: No apparent anesthesia complications

## 2012-07-07 NOTE — Transfer of Care (Signed)
Immediate Anesthesia Transfer of Care Note  Patient: Jerry Horton  Procedure(s) Performed: Procedure(s) (LRB): REPAIR EXTENSOR TENDON (Right)  Patient Location: PACU  Anesthesia Type: General  Level of Consciousness: awake, oriented, sedated and patient cooperative  Airway & Oxygen Therapy: Patient Spontanous Breathing and Patient connected to face mask oxygen  Post-op Assessment: Report given to PACU RN and Post -op Vital signs reviewed and stable  Post vital signs: Reviewed and stable  Complications: No apparent anesthesia complications

## 2012-07-10 ENCOUNTER — Encounter (HOSPITAL_BASED_OUTPATIENT_CLINIC_OR_DEPARTMENT_OTHER): Payer: Self-pay | Admitting: Orthopedic Surgery

## 2012-07-10 NOTE — Op Note (Signed)
Jerry Horton, Jerry Horton              ACCOUNT NO.:  [id number]  MEDICAL RECORD NO.:  [id number]  LOCATION:                               FACILITY:  [facility]  PHYSICIAN:  Marlowe Kays, M.D.  DATE OF BIRTH:  [birth year]  DATE OF PROCEDURE:  07/07/2012 DATE OF DISCHARGE:  07/07/2012                              OPERATIVE REPORT   PREOPERATIVE DIAGNOSIS:  Chronic lateral epicondylitis, right elbow.  POSTOPERATIVE DIAGNOSIS:  Chronic lateral epicondylitis, right elbow.  OPERATION:  Repair of common extensor tendon, lateral right elbow with partial lateral epicondylectomy.  SURGEON:  Marlowe Kays, MD  ASSISTANT:  Nurse.  ANESTHESIA:  General.  PATHOLOGY/JUSTIFICATION FOR PROCEDURE:  I have followed this man for a long period of time, and we have treated his chronic lateral epicondylitis nonsurgically with injection, medication and rest.  He has also had an MRI which is demonstrated a high-grade incomplete tear of the common extensor tendon.  Accordingly, he is here today for the above- mentioned surgery.  PROCEDURE:  Satisfactory general anesthesia, pneumatic tourniquet to right upper extremity with arm esmarched out non-sterilely and tourniquet inflated to 250 mmHg.  Right arm was then prepped with DuraPrep and tourniquet to wrist and draped in sterile field.  Time-out performed.  Posterolateral incision midway between the lateral epicondyle and the olecranon, down through the subcutaneous tissue.  The common extensor tendon was identified.  He was a little injected superficially, but otherwise no abnormality was noted at that point.  I then detached from the lateral epicondyle with the horizontal U-shaped incision.  The underneath surface of the tendon was detached.  I debrided out the glassy looking pathologic tissue from the underneath surface of the common extensor tendon.  The joint was viewed and was normal.  I then with a half-inch osteotome, made a partial  lateral epicondylectomy down to raw bone.  I then placed 2 drill holes from distal to proximal edge of the remaining lateral epicondyle with a small drill.  On the underneath of 2 holes, I took a 0 Vicryl and introduced through the 2 holes from proximal to distal, and then sutured through the common extensor tendon, then back out through the superior hole. With the elbow flexed and some tension on the suture, I then began repairing the U-shaped portion of the incision, advancing the common extensor tendon proximally so that to overlay the raw bone.  Lastly, I then tied the proximal suture and closed the remaining tendon periosteal interval with more 0 Vicryl.  Wound was irrigated with sterile saline. Soft tissue was infiltrated with 0.5% plain Marcaine.  I then closed subcutaneous tissue with interrupted 3-0 Vicryl and skin with interrupted 4-0 nylon mattress sutures.  Betadine, Adaptic, dry sterile dressing, and a long-arm splint cast were applied.  Tourniquet at this time was released and he tolerated the procedure well, was taken to the recovery room in satisfactory condition with no known complications.          ______________________________ Marlowe Kays, M.D.     JA/MEDQ  D:  07/07/2012  T:  07/08/2012  Job:  [id number]

## 2012-07-13 ENCOUNTER — Encounter (HOSPITAL_BASED_OUTPATIENT_CLINIC_OR_DEPARTMENT_OTHER): Payer: Self-pay

## 2012-07-31 NOTE — Addendum Note (Signed)
Addendum  created 07/31/12 0803 by Diontae Route, MD   Modules edited:Anesthesia Responsible Staff    

## 2012-07-31 NOTE — Addendum Note (Signed)
Addendum  created 07/31/12 0803 by Phillips Grout, MD   Modules edited:Anesthesia Responsible Staff

## 2012-08-05 ENCOUNTER — Other Ambulatory Visit: Payer: Self-pay | Admitting: Pulmonary Disease

## 2012-09-25 ENCOUNTER — Telehealth: Payer: Self-pay | Admitting: Pulmonary Disease

## 2012-09-26 NOTE — Telephone Encounter (Signed)
Dr. Kriste Basque called and spoke with Dr. Egbert Garibaldi.

## 2012-09-30 ENCOUNTER — Other Ambulatory Visit: Payer: Self-pay | Admitting: Pulmonary Disease

## 2012-10-30 ENCOUNTER — Encounter: Payer: Self-pay | Admitting: Pulmonary Disease

## 2012-10-30 ENCOUNTER — Ambulatory Visit (INDEPENDENT_AMBULATORY_CARE_PROVIDER_SITE_OTHER)
Admission: RE | Admit: 2012-10-30 | Discharge: 2012-10-30 | Disposition: A | Discharge status: Home / Self Care | Payer: 59 | Source: Ambulatory Visit | Attending: Pulmonary Disease | Admitting: Pulmonary Disease

## 2012-10-30 ENCOUNTER — Ambulatory Visit (INDEPENDENT_AMBULATORY_CARE_PROVIDER_SITE_OTHER): Payer: 59 | Admitting: Pulmonary Disease

## 2012-10-30 VITALS — BP 124/82 | HR 68 | Temp 98.1°F | Ht 69.5 in | Wt 188.8 lb

## 2012-10-30 DIAGNOSIS — R1013 Epigastric pain: Secondary | ICD-10-CM

## 2012-10-30 DIAGNOSIS — R002 Palpitations: Secondary | ICD-10-CM

## 2012-10-30 DIAGNOSIS — Z0000 Encounter for general adult medical examination without abnormal findings: Secondary | ICD-10-CM

## 2012-10-30 DIAGNOSIS — K3189 Other diseases of stomach and duodenum: Secondary | ICD-10-CM

## 2012-10-30 DIAGNOSIS — J309 Allergic rhinitis, unspecified: Secondary | ICD-10-CM

## 2012-10-30 DIAGNOSIS — J45909 Unspecified asthma, uncomplicated: Secondary | ICD-10-CM

## 2012-10-30 MED ORDER — ALBUTEROL SULFATE HFA 108 (90 BASE) MCG/ACT IN AERS: 2.0000 | INHALATION_SPRAY | Freq: Four times a day (QID) | RESPIRATORY_TRACT | Status: DC | PRN

## 2012-10-30 MED ORDER — FLUTICASONE-SALMETEROL 250-50 MCG/DOSE IN AEPB: 1.0000 | INHALATION_SPRAY | Freq: Two times a day (BID) | RESPIRATORY_TRACT | Status: DC

## 2012-10-30 NOTE — Progress Notes (Signed)
Subjective:    Patient ID: Jerry Horton, male    DOB: 11/12/1960, 52 y.o.   MRN: [id number]  HPI 52 y/o BM here for a follow up visit... he has Asthma & allergies and a hx of VCD... he is also followed by DrBerry for [facility]- prev neg cardiac eval from [facility]...  ~  July 22, 2011: 24mo ROV & he returns due to recurrent breathing problem> he feels that his breathing capacity isn't what it used to be, not breathing as good as he used to, but not really SOB he says> hard for him to describe, states he doesn't have the breath to sing properly, notes an odd feeling in his throat if he jogs, & he's worried all this is due to something other than his asthma;  He does note the difficulty is more a prob getting the air "in" as opposed to "out" ie can't get a deep breath, but he denies nerves/ anxiety (except for anxiety over this problem);  He also c/o a "knot" pointing to the left clavicular head & notes an asymetry to the other side (exam is neg- clavicle feels normal);  He also saw DrHilty, [facility] for palpit w/ eval and "no cause found";  He's also had some incr LBP w/ additional eval from DrAplington w/ another MRI done...  We discussed all these issues and decided on further eval & a treatment plan ==> EVAL: check CXR, PFT, Labs, & CTChest for completeness;  RX: increase his ADVAIR to 250/50 Bid, add HYDROXYZINE 25mg  Tid, and ROV in 6-8 wks...    AR & Asthma> hx mod airflow obstruction in past; supposed to be on Advair100Bid but only uses it once/d he says; also has hx VCD w/ prev treatment w/ Hydroxyzine which helped his symptoms.Marland KitchenMarland Kitchen    ?pulm nodule> old films w/ ?9mm nodule right mid zone; subseq films didn't show the lesion; pending CTChest will help delineate...    Hx CP/ Palpit> he was evaluated by [facility] 2/12 for atypCP that occurred after rotator cuff surg by DrAplington; standard treadmill test was abnormal & he had a hypertensive BP response (but good exercise capacity noted); he was to return  for a Myoview scan> ?if this was done... He reports "no cause found" for palpit.    Dyspepsia> prev used Prilosec OTC as needed; he denies recent reflux symptoms, abd pain, etc...    GU>  He was seen 11/11 by DrTannenbaum for hx of pain in penis, left testis, & left groin area; noted to have Low-T (declined Rx) & ED (Staxyn prn)...    LBP> Followed by DrAplington for Ortho- he had right shoulder arthroscopy 4/08; and more recently resection distal clavicle head & part of acromion on 12/11; he tells me that he just had another MRI of his Lumbar spine & results are pending; now he is concerned regarding some asymmetry of his prox clavicular heads- he thinks the left is more prominent; my exam shows this area to appear wnl in my opinion; the CT Chest should allay his anxiety...    Anxiety> he does not endorse stress or anxiety as a cause of his unusual dyspnea; we discussed Rx w/ Hydroxyzine Pamoate 25mg  Tid (he used this prev for his VCD w/ improvement).  ~  September 20, 2011:  36mo ROV & last visit his dyspnea seemed multifactorial> related to Asthma w/ %1sec=63 & reduced mid flows, and anxiety over his condition;  We treated him w/ Advair250Bid and Hydroxyzine 25mg  Tid but this made him  too sleepy so he decr to one Qhs;  It seems as thought he prev dyspnea is better as he did not mention those symptoms during today's visit, rather he says he is here due to facial pressure that he has felt over the last month; esp right max sinus area, sl tender on palp, ?worse at night w/ lots of congestion in the AM, sl HA, denies f/c/s;  He tells me he went to the [facility] x2 w/ 2 diff antibiotic given but he doesn't recall the names & didn't bring bottles> he wants ENT referral & we discussed Rx w/ Depo/ Pred 3d taper, Saline nasal mist, Mucinex + Fluids, and refer per request...  ~  October 31, 2011:  84mo ROV & CPX> [name] reports a good year w/o new complaints or concerns;  He did see the Urology group 11/13 ([place]) & he reports  that his PSA was wnl & we do not need to repeat that...  We reviewed the following medical problems during today's office visit>>     AR & Asthma> on Advair100Bid; he denies cough, sput, SOB, etc; hx mod airflow obstruction in past; also has hx VCD w/ prev treatment w/ Hydroxyzine which helped his symptoms; his dentist sent him to OralSurg, DrRehm- "white patches" were candida & resolved w/ Nystatin; reminded to rinse well after inhaler use...    ?pulm nodule> old films w/ ?9mm nodule right mid zone; subseq films didn't show the lesion; CTChest 9/12 showed 4mm RUL nodule; he is asymptomatic- f/u CXR today shows norm heart size, clear lungs, no nodules seen...    Hx CP/ Palpit> on Diltiazem 30mg  prn for palpit; he was evaluated by [facility] 2/12 for atypCP that occurred after rotator cuff surg by DrAplington; standard treadmill test was abnormal & he had a hypertensive BP response (but good exercise capacity noted); he was to return for a Myoview> ?if this was done... He reports "no cause found" for the palpit.    Dyspepsia> prev used Prilosec OTC as needed; he denies recent reflux symptoms, abd pain, etc...    GU>  He was seen 11/13 by DWarden> c/o penile pain & felt to have ?IC ?prostatitis; he reports that PSA was wnl, told to avoid kool-aid & bladder irritants; given Mobic15/d & Robaxin500Bid...    LBP> Followed by DrAplington for Ortho- he had right shoulder arthroscopy 4/08; and more recently resection distal clavicle head & part of acromion on 12/11; he tells me that he just had another MRI of his Lumbar spine & results are pending from Ortho;  He had right tennis elbow surg 9/13 by DrAplington & improved...    Anxiety> we discussed Rx w/ Hydroxyzine Pamoate 25mg  Tid prn (he used this prev for his VCD w/ improvement)... We reviewed prob list, meds, xrays and labs> see below for updates >>  CXR 1/14 showed norm heart size, clear lungs, no change in distal clavicles... LABS 1/14:  FLP- at goals on diet  alone;  Chems- wnl;  CBC- wnl;  TSH=0.87          Problem List:  ALLERGIC RHINITIS (ICD-477.9) - he uses OTC Zyrtek as needed... ~  allergy testing in 1997 by DrSharma showed 4+ reactions to trees, grass, mold, dust, etc... ~  CT Sinuses 9/06 was neg/ WNL... ~  11/12: presents w/ sinus symptoms having been to [facility] x2 & requesting ENT referral...  ASTHMA (ICD-493.90) & Hx of VOCAL CORD DISORDER (ICD-478.5) - on ADVAIR 250Bid now & PROAIR as needed... ~  baseline CXR shows clear lungs, NAD... ~  prev PFT's reveal mod airflow obstruction... ~  9/12:  He presented w/ dyspnea- hard for him to describe ==>  PFT 9/12 showed FVC=4.12 (99%), FEV1=2.58 (77%), %1sec=63, mid-flows=36%pred; Advair incr to 250Bid.  CXR 9/12 ==> clear, NAD, s/p surg on distal right clavicle...  CTChest 9/12 ==> neg, clear lungs, x 3.47mm RUL nodule noted, no adenopathy, & they rec f/u CT in 39yr to recheck this nodule...  ? of PULMONARY NODULE (ICD-518.89) - old films w/ ? 9mm nodule right mid lung zone superimposed on post aspect of 7th rib... no change serially... ~  CXR 9/10 showed stable 9mm RUL nodule- no change. ~  CXR 8/11 showed clear lungs, no mention of the prev nodule, NAD.Marland Kitchen. ~  CXR & CTChest 9/12 ==> see above> CXR neg (no nodule seen) & CT w/ clear lungs x 3.64mm RUL nodule noted, no adenopathy, & they rec f/u CT in 73yr to recheck this nodule... ~  1/14:  CXR 1/14 showed norm heart size, clear lungs, no nodules seen, no change in distal clavicles...  Hx of CHEST PAIN, ATYPICAL (ICD-786.59) & Hx of PALPITATIONS (ICD-785.1) - he is followed by [facility]- prev DrMcQueen & then DrBerry, & now DrHilty... ? MVP by Echo in 2000... neg Cardiolite 2002... CC is palpit that are worse w/ caffeine, choc, etc... ~  baseline EKG shows NSR, WNL.Marland Kitchen. ~  full eval by Cards 2008 w/ Holter, 2DEcho, Myoview- all normal... ~  Myoview 3/08 was normal- no ischemia, no infarction, EF= 67%... ~  8/11: denies CP, palpit, SOB, etc... exerc  at [facility] & doing satis. ~  2/12: he developed some CWP after rotator cuff surg by DrAplington & saw DrHilty for Cards eval; he had an abn standard bruce protocol treadmill test & was supposed to have a Myoview done after that> ?if done, ?results...  Hx of DYSPEPSIA (ICD-536.8) - uses OMEPRAZOLE 20mg  as needed... prev evals by DrPerry... ~  last EGD 11/03 was WNL...  GU > Followed by DrTannenbaum for hx of pain in penis, left testis, & left groin area; noted to have Low-T (declined Rx) & ED (Staxyn prn)...  Right Shoulder Pain > Ortho eval by DrAplington w/ 2 prev surg: he had right shoulder arthroscopy 4/08; and more recently resection distal clavicle head & part of acromion on 12/11...  BACK PAIN, LUMBAR (ICD-724.2) - eval 7/09 from DrAplington w/ MRI L/S spine showing L2 S1 disc bulging without compression... he still c/o discomfort in the muscles in his left leg... he will f/u w/ Ortho & Neuro for this...  ANXIETY (ICD-300.00) - we have rec that he take HYDROXYZINE (Vistaril) 25mg  Tid but it made him too sleepy & he decr to 1 Qhs...  Family Hx of OTH SYMPTOMS INVLV NERV&MUSCULOSKELETAL SYSTEMS (ICD-781.99) - he has a brohter w/ ?myotonia, and a grandparent w/ hx of myasthenia gravis... eval by DrWillis in 2007 was neg- pt was concerned about some fatigue... ~  EMG/ NCV 4/07 by DrWillis was totally normal w/o neuropathy or muscle disorder...   Past Surgical History  Procedure Date  . Shoulder arthroscopy 01/2007    DrAplington--  RIGHT SHOULDER  . Resection of distal right clavicle & acromion 09/2010    DrAplington  . Open resection distal right clavicle and ac joint arthritis 09-23-2010    DR Simonne Come  . Umbilical hernia repair CHILD  . Repair extensor tendon 07/07/2012    Procedure: REPAIR EXTENSOR TENDON;  Surgeon: Drucilla Schmidt, MD;  Location: [facility];  Service: Orthopedics;  Laterality: Right;  REPAIR OF COMMON EXTENSOR TENDON WITH PARTIAL LATERAL  EPICONDYLECTOMY OF THE RIGHT ELBOW     Outpatient Encounter Prescriptions as of 10/30/2012  Medication Sig Dispense Refill  . ADVAIR DISKUS 250-50 MCG/DOSE AEPB INHALE 1 PUFF INTO THE LUNGS 2 TIMES DAILY.  60 each  2  . albuterol (PROAIR HFA) 108 (90 BASE) MCG/ACT inhaler Inhale 2 puffs into the lungs every 6 (six) hours as needed.  1 Inhaler  11  . diltiazem (CARDIZEM) 30 MG tablet Take 1 tablet by mouth Once daily as needed.        No Known Allergies   Current Medications, Allergies, Past Medical History, Past Surgical History, Family History, and Social History were reviewed in [employer] record.    Review of Systems       The patient denies fever, chills, sweats, anorexia, fatigue, weakness, malaise, weight loss, sleep disorder, blurring, diplopia, eye irritation, eye discharge, vision loss, eye pain, photophobia, earache, ear discharge, tinnitus, decreased hearing, nasal congestion, nosebleeds, sore throat, hoarseness, chest pain, palpitations, syncope, dyspnea on exertion, orthopnea, PND, peripheral edema, cough, dyspnea at rest, excessive sputum, hemoptysis, wheezing, pleurisy, nausea, vomiting, diarrhea, constipation, change in bowel habits, abdominal pain, melena, hematochezia, jaundice, gas/bloating, indigestion/heartburn, dysphagia, odynophagia, dysuria, hematuria, urinary frequency, urinary hesitancy, nocturia, incontinence, back pain, joint pain, joint swelling, muscle cramps, muscle weakness, stiffness, arthritis, sciatica, restless legs, leg pain at night, leg pain with exertion, rash, itching, dryness, suspicious lesions, paralysis, paresthesias, seizures, tremors, vertigo, transient blindness, frequent falls, frequent headaches, difficulty walking, depression, anxiety, memory loss, confusion, cold intolerance, heat intolerance, polydipsia, polyphagia, polyuria, unusual weight change, abnormal bruising, bleeding, enlarged lymph nodes, urticaria, allergic rash,  hay fever, and recurrent infections.     Objective:   Physical Exam    WD, WN, 51 y/o BM in NAD... GENERAL:  Alert & oriented; pleasant & cooperative... HEENT:  [place]/AT, EOM-wnl, PERRLA, Fundi-benign, EACs-clear, TMs-wnl, NOSE-clear, THROAT-clear & wnl. NECK:  Supple w/ full ROM; no JVD; normal carotid impulses w/o bruits; no thyromegaly or nodules palpated; no lymphadenopathy. CHEST:  Clear to P & A; without wheezes/ rales/ or rhonchi heard... HEART:  Regular Rhythm; without murmurs/ rubs/ or gallops detected... ABDOMEN:  Soft & nontender; normal bowel sounds; no organomegaly or masses palpated... RECTAL:  Neg - prostate 3+ & nontender w/o nodules; stool hematest neg. EXT: without deformities or arthritic changes; no varicose veins/ venous insuffic/ or edema... some swelling & tender in IP joint of right thumb. NEURO:  CN's intact; motor testing normal; sensory testing normal; gait normal & balance OK. DERM:  No lesions noted; no rash etc...   LAB DATA FROM 9/12:   CXR:  ==> clear, NAD, nodule not seen on CXR, s/p surg on distal right clavicle...  PFT:   FVC=4.12 (99%), FEV1=2.58 (77%), %1sec=63, mid-flows=36%pred...  LABS:   ==> all WNL...  CTChest:  ==> neg, clear lungs x 3.45mm RUL nodule noted, no adenopathy, & they rec f/u CT in 19yr to recheck this nodule...   Assessment & Plan:    Sinusitis>  He presents w/ sinus symptoms but no better after 2 antibiotic per [facility]; doesn't sound infected, rather inflammed & we will Rx w/ Depo/ Pres, Saline, Mucinex, etc; refer to ENT per pt request...  AR & Asthma>  PFT confirmed mod airflow obstruction; now on ADVAIR 250Bid regularly; also has hx VCD, & his unusual dyspnea seems better on HYDROXYZINE PAMOATE 25mg  Tid as needed.Marland KitchenMarland Kitchen  Pulm nodule> old films w/ ?9mm nodule right mid zone; subseq films didn't show the lesion; CT Chest 9/12 w/ 4mm RUL nodule & f/u 10yr is suggested...   Hx CP/ Palpit> he was evaluated by [facility] 2/12 for atypCP  that occurred after shoulder surg by DrAplington; standard treadmill test was abnormal & he had a hypertensive BP response (but good exercise capacity noted); he was to return for a Myoview scan> ?if this was done... He reports "no cause found" for palpit.     Dyspepsia> prev used Prilosec OTC as needed; he denies recent reflux symptoms, abd pain, etc...     GU>  He was seen 11/11 by DrTannenbaum for hx of pain in penis, left testis, & left groin area; noted to have Low-T (declined Rx) & ED (Staxyn prn)...     LBP> Followed by DrAplington for Ortho- he had right shoulder arthroscopy 4/08; and more recently resection distal clavicle head & part of acromion on 12/11; he tells me that he just had another MRI of his Lumbar spine & results are pending; now he is concerned regarding some asymmetry of his prox clavicular heads- he thinks the left is more prominent; my exam shows this area to appear wnl in my opinion; the CT Chest should allay his anxiety...     Anxiety> he does not endorse stress or anxiety as a cause of his unusual dyspnea; we discussed Rx w/ Hydroxyzine Pamoate 25mg  Tid (he used this prev for his VCD w/ improvement).   Patient's Medications  New Prescriptions   No medications on file  Previous Medications   ADVAIR DISKUS 250-50 MCG/DOSE AEPB    INHALE 1 PUFF INTO THE LUNGS 2 TIMES DAILY.   ALBUTEROL (PROAIR HFA) 108 (90 BASE) MCG/ACT INHALER    Inhale 2 puffs into the lungs every 6 (six) hours as needed.   DILTIAZEM (CARDIZEM) 30 MG TABLET    Take 1 tablet by mouth Once daily as needed.  Modified Medications   No medications on file  Discontinued Medications   No medications on file

## 2012-10-30 NOTE — Patient Instructions (Addendum)
Today we updated your med list in our EPIC system...    Continue your current medications the same...    We refilled your Advair inhaler...  Today we did your follow up CXR... Please return to our lab one morning this week for you FASTING blood work...    We will contact you w/ the results when avail...  Call for any problems & have a good year!  Let's continue our yearly check ups, & call sooner for any concerns.Marland KitchenMarland Kitchen

## 2012-10-31 ENCOUNTER — Other Ambulatory Visit (INDEPENDENT_AMBULATORY_CARE_PROVIDER_SITE_OTHER): Payer: 59

## 2012-10-31 DIAGNOSIS — Z0000 Encounter for general adult medical examination without abnormal findings: Secondary | ICD-10-CM

## 2012-10-31 LAB — HEPATIC FUNCTION PANEL
ALT: 32 U/L (ref 0–53)
AST: 29 U/L (ref 0–37)
Albumin: 4.1 g/dL (ref 3.5–5.2)
Alkaline Phosphatase: 66 U/L (ref 39–117)
Bilirubin, Direct: 0.1 mg/dL (ref 0.0–0.3)
Total Bilirubin: 1.4 mg/dL — ABNORMAL HIGH (ref 0.3–1.2)
Total Protein: 7.6 g/dL (ref 6.0–8.3)

## 2012-10-31 LAB — BASIC METABOLIC PANEL
BUN: 14 mg/dL (ref 6–23)
CO2: 30 mEq/L (ref 19–32)
Calcium: 9.2 mg/dL (ref 8.4–10.5)
Chloride: 101 mEq/L (ref 96–112)
Creatinine, Ser: 1.2 mg/dL (ref 0.4–1.5)
GFR: 79.34 mL/min (ref >60.00)
Glucose, Bld: 85 mg/dL (ref 70–99)
Potassium: 4 mEq/L (ref 3.5–5.1)
Sodium: 138 mEq/L (ref 135–145)

## 2012-10-31 LAB — CBC WITH DIFFERENTIAL/PLATELET
Basophils Absolute: 0 10*3/uL (ref 0.0–0.1)
Basophils Relative: 0.8 % (ref 0.0–3.0)
Eosinophils Absolute: 0.2 10*3/uL (ref 0.0–0.7)
Eosinophils Relative: 3.6 % (ref 0.0–5.0)
HCT: 42.9 % (ref 39.0–52.0)
Hemoglobin: 15 g/dL (ref 13.0–17.0)
Lymphocytes Relative: 35.7 % (ref 12.0–46.0)
Lymphs Abs: 1.8 10*3/uL (ref 0.7–4.0)
MCHC: 34.9 g/dL (ref 30.0–36.0)
MCV: 93.9 fl (ref 78.0–100.0)
Monocytes Absolute: 0.6 10*3/uL (ref 0.1–1.0)
Monocytes Relative: 11.9 % (ref 3.0–12.0)
Neutro Abs: 2.4 10*3/uL (ref 1.4–7.7)
Neutrophils Relative %: 48 % (ref 43.0–77.0)
Platelets: 207 10*3/uL (ref 150.0–400.0)
RBC: 4.57 Mil/uL (ref 4.22–5.81)
RDW: 13.3 % (ref 11.5–14.6)
WBC: 5.1 10*3/uL (ref 4.5–10.5)

## 2012-10-31 LAB — LIPID PANEL
Cholesterol: 172 mg/dL (ref 0–200)
HDL: 55.3 mg/dL (ref >39.00)
LDL Cholesterol: 100 mg/dL — ABNORMAL HIGH (ref 0–99)
Total CHOL/HDL Ratio: 3
Triglycerides: 84 mg/dL (ref 0.0–149.0)
VLDL: 16.8 mg/dL (ref 0.0–40.0)

## 2012-10-31 LAB — TSH: TSH: 0.87 u[IU]/mL (ref 0.35–5.50)

## 2012-11-02 NOTE — Progress Notes (Signed)
Quick Note:  Spoke with pt and notified of results per Dr. Nadel. Pt verbalized understanding and denied any questions.  ______ 

## 2012-11-20 ENCOUNTER — Ambulatory Visit (INDEPENDENT_AMBULATORY_CARE_PROVIDER_SITE_OTHER): Payer: 59 | Admitting: Adult Health

## 2012-11-20 ENCOUNTER — Encounter: Payer: Self-pay | Admitting: Adult Health

## 2012-11-20 VITALS — BP 124/70 | HR 82 | Temp 98.2°F | Ht 70.0 in | Wt 187.6 lb

## 2012-11-20 DIAGNOSIS — J45909 Unspecified asthma, uncomplicated: Secondary | ICD-10-CM

## 2012-11-20 MED ORDER — LEVALBUTEROL HCL 0.63 MG/3ML IN NEBU
0.6300 mg | INHALATION_SOLUTION | Freq: Once | RESPIRATORY_TRACT | Status: AC
  Administered 2012-11-20: 0.63 mg via RESPIRATORY_TRACT

## 2012-11-20 MED ORDER — PREDNISONE 10 MG PO TABS: ORAL_TABLET | ORAL | Status: DC

## 2012-11-20 NOTE — Assessment & Plan Note (Addendum)
Slow to resolve flare with URI and oral candidiasis  xopenex neb in office   Plan  Prednisone taper over next week.  Mucinex Twice daily  As needed  Cough/congestion  Mycelex troche five times daily .  Brush/rinse and gargle after inhaler use.  Please contact office for sooner follow up if symptoms do not improve or worsen or seek emergency care  follow up Dr. Kriste Basque  As planned and As needed

## 2012-11-20 NOTE — Progress Notes (Signed)
Subjective:    Patient ID: Jerry Horton, male    DOB: 30-Aug-1961, 52 y.o.   MRN: [id number]  HPI 52 y/o BM with known hx of  Asthma & allergies and a hx of VCD... he is also followed by DrBerry for [facility]- prev neg cardiac eval from [facility]...  ~  July 22, 2011: 39mo ROV & he returns due to recurrent breathing problem> he feels that his breathing capacity isn't what it used to be, not breathing as good as he used to, but not really SOB he says> hard for him to describe, states he doesn't have the breath to sing properly, notes an odd feeling in his throat if he jogs, & he's worried all this is due to something other than his asthma;  He does note the difficulty is more a prob getting the air "in" as opposed to "out" ie can't get a deep breath, but he denies nerves/ anxiety (except for anxiety over this problem);  He also c/o a "knot" pointing to the left clavicular head & notes an asymetry to the other side (exam is neg- clavicle feels normal);  He also saw DrHilty, [facility] for palpit w/ eval and "no cause found";  He's also had some incr LBP w/ additional eval from DrAplington w/ another MRI done...  We discussed all these issues and decided on further eval & a treatment plan ==> EVAL: check CXR, PFT, Labs, & CTChest for completeness;  RX: increase his ADVAIR to 250/50 Bid, add HYDROXYZINE 25mg  Tid, and ROV in 6-8 wks...    AR & Asthma> hx mod airflow obstruction in past; supposed to be on Advair100Bid but only uses it once/d he says; also has hx VCD w/ prev treatment w/ Hydroxyzine which helped his symptoms.Marland KitchenMarland Kitchen    ?pulm nodule> old films w/ ?9mm nodule right mid zone; subseq films didn't show the lesion; pending CTChest will help delineate...    Hx CP/ Palpit> he was evaluated by [facility] 2/12 for atypCP that occurred after rotator cuff surg by DrAplington; standard treadmill test was abnormal & he had a hypertensive BP response (but good exercise capacity noted); he was to return for a Myoview  scan> ?if this was done... He reports "no cause found" for palpit.    Dyspepsia> prev used Prilosec OTC as needed; he denies recent reflux symptoms, abd pain, etc...    GU>  He was seen 11/11 by DrTannenbaum for hx of pain in penis, left testis, & left groin area; noted to have Low-T (declined Rx) & ED (Staxyn prn)...    LBP> Followed by DrAplington for Ortho- he had right shoulder arthroscopy 4/08; and more recently resection distal clavicle head & part of acromion on 12/11; he tells me that he just had another MRI of his Lumbar spine & results are pending; now he is concerned regarding some asymmetry of his prox clavicular heads- he thinks the left is more prominent; my exam shows this area to appear wnl in my opinion; the CT Chest should allay his anxiety...    Anxiety> he does not endorse stress or anxiety as a cause of his unusual dyspnea; we discussed Rx w/ Hydroxyzine Pamoate 25mg  Tid (he used this prev for his VCD w/ improvement).  ~  September 20, 2011:  33mo ROV & last visit his dyspnea seemed multifactorial> related to Asthma w/ %1sec=63 & reduced mid flows, and anxiety over his condition;  We treated him w/ Advair250Bid and Hydroxyzine 25mg  Tid but this made him too sleepy so  he decr to one Qhs;  It seems as thought he prev dyspnea is better as he did not mention those symptoms during today's visit, rather he says he is here due to facial pressure that he has felt over the last month; esp right max sinus area, sl tender on palp, ?worse at night w/ lots of congestion in the AM, sl HA, denies f/c/s;  He tells me he went to the [facility] x2 w/ 2 diff antibiotic given but he doesn't recall the names & didn't bring bottles> he wants ENT referral & we discussed Rx w/ Depo/ Pred 3d taper, Saline nasal mist, Mucinex + Fluids, and refer per request...  ~  October 31, 2011:  67mo ROV & CPX> Ed reports a good year w/o new complaints or concerns;  He did see the Urology group 11/13 ([facility]) & he reports that his PSA was  wnl & we do not need to repeat that...  We reviewed the following medical problems during today's office visit>>     AR & Asthma> on Advair100Bid; he denies cough, sput, SOB, etc; hx mod airflow obstruction in past; also has hx VCD w/ prev treatment w/ Hydroxyzine which helped his symptoms; his dentist sent him to OralSurg, DrRehm- "white patches" were candida & resolved w/ Nystatin; reminded to rinse well after inhaler use...    ?pulm nodule> old films w/ ?9mm nodule right mid zone; subseq films didn't show the lesion; CTChest 9/12 showed 4mm RUL nodule; he is asymptomatic- f/u CXR today shows     Hx CP/ Palpit> on Diltiazem 30mg  prn for palpit; he was evaluated by [facility] 2/12 for atypCP that occurred after rotator cuff surg by DrAplington; standard treadmill test was abnormal & he had a hypertensive BP response (but good exercise capacity noted); he was to return for a Myoview> ?if this was done... He reports "no cause found" for the palpit.    Dyspepsia> prev used Prilosec OTC as needed; he denies recent reflux symptoms, abd pain, etc...    GU>  He was seen 111/13 by DWarden> c/o penile pain & felt to have ?IC ?prostatitis; he reports that PSA was wnl, told to avoid kool-aid & bladder irritants; given Mobic15/d & Robaxin500Bid...    LBP> Followed by DrAplington for Ortho- he had right shoulder arthroscopy 4/08; and more recently resection distal clavicle head & part of acromion on 12/11; he tells me that he just had another MRI of his Lumbar spine & results are pending from Ortho;  He had right tennis elbow surg 9/13 by DrAplington & improved...    Anxiety> we discussed Rx w/ Hydroxyzine Pamoate 25mg  Tid prn (he used this prev for his VCD w/ improvement)... We reviewed prob list, meds, xrays and labs> see below for updates >>  CXR 1/14 showed norm heart size, clear lungs, no change in distal clavicles... LABS 1/14:  pending  11/20/2012 Acute OV  Complains of cough occasionally producing yellow/clear  mucus, head congestion with same-colored mucus, some wheezing and SOB x2 weeks.  - denies f/c/s, tightness.   went to UC at onset, given tussicaps (stopped), zpak (finished) and mucinex (taking) CXR 1/91/4 -NAD  No fever, chest pain or edema.  Has minimal congestion .  Main issue is wheezing and increased SABA use.  Using some mucinex w/ some relief.          Problem List:  ALLERGIC RHINITIS (ICD-477.9) - he uses OTC Zyrtek as needed... ~  allergy testing in 1997 by DrSharma showed 4+ reactions  to trees, grass, mold, dust, etc... ~  CT Sinuses 9/06 was neg/ WNL... ~  11/12: presents w/ sinus symptoms having been to [facility] x2 & requesting ENT referral...  ASTHMA (ICD-493.90) & Hx of VOCAL CORD DISORDER (ICD-478.5) - on ADVAIR 250Bid now & PROAIR as needed... ~  baseline CXR shows clear lungs, NAD... ~  prev PFT's reveal mod airflow obstruction... ~  9/12:  He presented w/ dyspnea- hard for him to describe ==>  PFT 9/12 showed FVC=4.12 (99%), FEV1=2.58 (77%), %1sec=63, mid-flows=36%pred; Advair incr to 250Bid.  CXR 9/12 ==> clear, NAD, s/p surg on distal right clavicle...  CTChest 9/12 ==> neg, clear lungs, x 3.30mm RUL nodule noted, no adenopathy, & they rec f/u CT in 58yr to recheck this nodule...  ? of PULMONARY NODULE (ICD-518.89) - old films w/ ? 9mm nodule right mid lung zone superimposed on post aspect of 7th rib... no change serially... ~  CXR 9/10 showed stable 9mm RUL nodule- no change. ~  CXR 8/11 showed clear lungs, no mention of the prev nodule, NAD.Marland Kitchen. ~  CXR & CTChest 9/12 ==> see above> CXR neg (no nodule seen) & CT w/ clear lungs x 3.40mm RUL nodule noted, no adenopathy, & they rec f/u CT in 70yr to recheck this nodule... ~  1/14:  CXR 1/14 showed norm heart size, clear lungs, no nodules seen, no change in distal clavicles...  Hx of CHEST PAIN, ATYPICAL (ICD-786.59) & Hx of PALPITATIONS (ICD-785.1) - he is followed by [facility]- prev DrMcQueen & then DrBerry, & now DrHilty... ? MVP  by Echo in 2000... neg Cardiolite 2002... CC is palpit that are worse w/ caffeine, choc, etc... ~  baseline EKG shows NSR, WNL.Marland Kitchen. ~  full eval by Cards 2008 w/ Holter, 2DEcho, Myoview- all normal... ~  Myoview 3/08 was normal- no ischemia, no infarction, EF= 67%... ~  8/11: denies CP, palpit, SOB, etc... exerc at [facility] & doing satis. ~  2/12: he developed some CWP after rotator cuff surg by DrAplington & saw DrHilty for Cards eval; he had an abn standard bruce protocol treadmill test & was supposed to have a Myoview done after that> ?if done, ?results...  Hx of DYSPEPSIA (ICD-536.8) - uses OMEPRAZOLE 20mg  as needed... prev evals by DrPerry... ~  last EGD 11/03 was WNL...  GU > Followed by DrTannenbaum for hx of pain in penis, left testis, & left groin area; noted to have Low-T (declined Rx) & ED (Staxyn prn)...  Right Shoulder Pain > Ortho eval by DrAplington w/ 2 prev surg: he had right shoulder arthroscopy 4/08; and more recently resection distal clavicle head & part of acromion on 12/11...  BACK PAIN, LUMBAR (ICD-724.2) - eval 7/09 from DrAplington w/ MRI L/S spine showing L2 S1 disc bulging without compression... he still c/o discomfort in the muscles in his left leg... he will f/u w/ Ortho & Neuro for this...  ANXIETY (ICD-300.00) - we have rec that he take HYDROXYZINE (Vistaril) 25mg  Tid but it made him too sleepy & he decr to 1 Qhs...  Family Hx of OTH SYMPTOMS INVLV NERV&MUSCULOSKELETAL SYSTEMS (ICD-781.99) - he has a brohter w/ ?myotonia, and a grandparent w/ hx of myasthenia gravis... eval by DrWillis in 2007 was neg- pt was concerned about some fatigue... ~  EMG/ NCV 4/07 by DrWillis was totally normal w/o neuropathy or muscle disorder...   Past Surgical History  Procedure Date  . Shoulder arthroscopy 01/2007    DrAplington--  RIGHT SHOULDER  . Resection of distal right  clavicle & acromion 09/2010    DrAplington  . Open resection distal right clavicle and ac joint arthritis  09-23-2010    DR Simonne Come  . Umbilical hernia repair CHILD  . Repair extensor tendon 07/07/2012    Procedure: REPAIR EXTENSOR TENDON;  Surgeon: Drucilla Schmidt, MD;  [facility];  Service: Orthopedics;  Laterality: Right;  REPAIR OF COMMON EXTENSOR TENDON WITH PARTIAL LATERAL EPICONDYLECTOMY OF THE RIGHT ELBOW     Outpatient Encounter Prescriptions as of 11/20/2012  Medication Sig Dispense Refill  . albuterol (PROAIR HFA) 108 (90 BASE) MCG/ACT inhaler Inhale 2 puffs into the lungs every 6 (six) hours as needed.  1 Inhaler  11  . diltiazem (CARDIZEM) 30 MG tablet Take 1 tablet by mouth Once daily as needed.      . Fluticasone-Salmeterol (ADVAIR DISKUS) 250-50 MCG/DOSE AEPB Inhale 1 puff into the lungs 2 (two) times daily.  60 each  11    No Known Allergies   Current Medications, Allergies, Past Medical History, Past Surgical History, Family History, and Social History were reviewed in [employer] record.    Review of Systems       Constitutional:   No  weight loss, night sweats,  Fevers, chills,  +fatigue, or  lassitude.  HEENT:   No headaches,  Difficulty swallowing,  Tooth/dental problems, or  Sore throat,                No sneezing, itching, ear ache,  +nasal congestion, post nasal drip,   CV:  No chest pain,  Orthopnea, PND, swelling in lower extremities, anasarca, dizziness, palpitations, syncope.   GI  No heartburn, indigestion, abdominal pain, nausea, vomiting, diarrhea, change in bowel habits, loss of appetite, bloody stools.   Resp:   No coughing up of blood.     No chest wall deformity  Skin: no rash or lesions.  GU: no dysuria, change in color of urine, no urgency or frequency.  No flank pain, no hematuria   MS:  No joint pain or swelling.  No decreased range of motion.  No back pain.  Psych:  No change in mood or affect. No depression or anxiety.  No memory loss.       Objective:   Physical Exam    WD, WN,  51 y/o BM in NAD... GENERAL:  Alert & oriented; pleasant & cooperative... HEENT:  [place]/AT,   EACs-clear, TMs-wnl, NOSE-clear drainage  THROAT-clear & wnl. NECK:  Supple w/ full ROM; no JVD; normal carotid impulses w/o bruits; no thyromegaly or nodules palpated; no lymphadenopathy. CHEST:  Few wheezes bilaterally... HEART:  Regular Rhythm; without murmurs/ rubs/ or gallops detected... ABDOMEN:  Soft & nontender; normal bowel sounds; no organomegaly or masses palpated... EXT: without deformities or arthritic changes; no varicose veins/ venous insuffic/ or edema...  NEURO:   ; gait normal & balance OK. DERM:  No lesions noted; no rash etc...     Assessment & Plan:

## 2012-11-20 NOTE — Addendum Note (Signed)
Addended by: Boone Master E on: 11/20/2012 12:44 PM   Modules accepted: Orders

## 2012-11-20 NOTE — Patient Instructions (Addendum)
Prednisone taper over next week.  Mucinex Twice daily  As needed  Cough/congestion  Mycelex troche five times daily .  Brush/rinse and gargle after inhaler use.  Please contact office for sooner follow up if symptoms do not improve or worsen or seek emergency care  follow up Dr. Kriste Basque  As planned and As needed

## 2012-11-22 ENCOUNTER — Telehealth: Payer: Self-pay | Admitting: Pulmonary Disease

## 2012-11-22 MED ORDER — CLOTRIMAZOLE 10 MG MT TROC: 10.0000 mg | Freq: Every day | OROMUCOSAL | Status: DC

## 2012-11-22 NOTE — Telephone Encounter (Signed)
Per last OV note TP was to send Mycelex troche. Per TP send take one five times daily for 7 days, #35. Rx sent pt aware.Carron Curie, CMA

## 2013-01-11 ENCOUNTER — Other Ambulatory Visit: Payer: Self-pay | Admitting: Pulmonary Disease

## 2013-02-08 ENCOUNTER — Other Ambulatory Visit: Payer: Self-pay | Admitting: Pulmonary Disease

## 2013-04-19 ENCOUNTER — Encounter: Payer: Self-pay | Admitting: Internal Medicine

## 2013-04-19 ENCOUNTER — Ambulatory Visit (INDEPENDENT_AMBULATORY_CARE_PROVIDER_SITE_OTHER): Payer: 59 | Admitting: Internal Medicine

## 2013-04-19 VITALS — BP 100/60 | HR 69 | Ht 69.5 in | Wt 170.5 lb

## 2013-04-19 DIAGNOSIS — R079 Chest pain, unspecified: Secondary | ICD-10-CM

## 2013-04-19 DIAGNOSIS — F411 Generalized anxiety disorder: Secondary | ICD-10-CM

## 2013-04-19 DIAGNOSIS — R002 Palpitations: Secondary | ICD-10-CM

## 2013-04-19 DIAGNOSIS — R0789 Other chest pain: Secondary | ICD-10-CM

## 2013-04-19 NOTE — Progress Notes (Signed)
OFFICE NOTE  Chief Complaint:  Chest pain, palpitations  Primary Care Physician: Michele Mcalpine, MD  HPI:  Jerry Horton  is a 52 year old gentleman with a history of palpitations as well as atypical chest pain with a negative nuclear stress test in 2012. He also has asthma, but otherwise is doing fairly well. He has recently been having some more palpitations than usual, associated with some chest tightness. He also noted some chest tightness during sexual activity, which causes him to stop and improves with rest. His palpitations are less severe than it had been previously, he notes that his chest discomfort is in the lower midsternal and upper midepigastric area. Some of the symptoms are worse when lying down waking up in the morning, reminiscent of GERD. He was recently started on Nexium which she is only been taking for 2 days. He does say he feels that he has trapped gas in his chest.  He's not as active as he used to be, and is concerned about possible angina.  PMHx:  Past Medical History  Diagnosis Date  . Allergic rhinitis, cause unspecified   . Palpitations OCCASIONAL  . Anxiety state, unspecified   . Epicondylitis, lateral, right CHRONIC  . MVP (mitral valve prolapse) OCC . PALPITATIONS  . Pulmonary nodule STABLE PER CT  . Asthma, moderate     Past Surgical History  Procedure Laterality Date  . Shoulder arthroscopy  01/2007    DrAplington--  RIGHT SHOULDER  . Resection of distal right clavicle & acromion  09/2010    DrAplington  . Open resection distal right clavicle and ac joint arthritis  09-23-2010    DR Simonne Come  . Umbilical hernia repair  CHILD  . Repair extensor tendon  07/07/2012    Procedure: REPAIR EXTENSOR TENDON;  Surgeon: Drucilla Schmidt, MD;  Location: [facility];  Service: Orthopedics;  Laterality: Right;  REPAIR OF COMMON EXTENSOR TENDON WITH PARTIAL LATERAL EPICONDYLECTOMY OF THE RIGHT ELBOW     FAMHx:  Family History  Problem  Relation Age of Onset  . Brain cancer Father   . Hypertension Mother     SOCHx:   reports that he has never smoked. He has never used smokeless tobacco. He reports that he does not drink alcohol or use illicit drugs.  ALLERGIES:  No Known Allergies  ROS: A comprehensive review of systems was negative except for: Respiratory: positive for dyspnea on exertion Cardiovascular: positive for chest pressure/discomfort  HOME MEDS: Current Outpatient Prescriptions  Medication Sig Dispense Refill  . ADVAIR DISKUS 250-50 MCG/DOSE AEPB INHALE 1 PUFF INTO THE LUNGS 2 TIMES DAILY.  60 each  11  . albuterol (PROAIR HFA) 108 (90 BASE) MCG/ACT inhaler Inhale 2 puffs into the lungs every 6 (six) hours as needed.  1 Inhaler  11  . amoxicillin (AMOXIL) 875 MG tablet       . diltiazem (CARDIZEM) 30 MG tablet Take 1 tablet by mouth Once daily as needed.      . Fluticasone-Salmeterol (ADVAIR DISKUS) 250-50 MCG/DOSE AEPB Inhale 1 puff into the lungs 2 (two) times daily.  60 each  11  . NEXIUM 20 MG capsule       . clotrimazole (MYCELEX) 10 MG troche Take 1 tablet (10 mg total) by mouth 5 (five) times daily. For 7 days.  35 tablet  0  . predniSONE (DELTASONE) 10 MG tablet 4 tabs for 2 days, then 3 tabs for 2 days, 2 tabs for 2 days, then 1 tab for  2 days, then stop  20 tablet  0   No current facility-administered medications for this visit.    LABS/IMAGING: No results found for this or any previous visit (from the past 48 hour(s)). No results found.  VITALS: BP 100/60  Pulse 69  Ht 5' 9.5" (1.765 m)  Wt 170 lb 8 oz (77.338 kg)  BMI 24.83 kg/m2  EXAM: General appearance: alert and no distress Neck: no adenopathy, no carotid bruit, no JVD, supple, symmetrical, trachea midline and thyroid not enlarged, symmetric, no tenderness/mass/nodules Lungs: clear to auscultation bilaterally Heart: regular rate and rhythm, S1, S2 normal, no murmur, click, rub or gallop Abdomen: soft, non-tender; bowel sounds  normal; no masses,  no organomegaly Extremities: extremities normal, atraumatic, no cyanosis or edema Pulses: 2+ and symmetric Skin: Skin color, texture, turgor normal. No rashes or lesions Neurologic: Grossly normal  EKG: Sinus rhythm at 69  ASSESSMENT: 1. Atypical chest pain 2. Palpitations 3. Asthma  PLAN: 1.   Jerry Horton is having chest pain with some typical and atypical features. He did seem to be worse during sexual activity, however this may be due to deconditioning. She has been having palpitations and a feeling of fullness in his chest or upper abdomen, was recently started on Nexium by his primary care provider. He had a nuclear stress test which was negative 2 years ago and has few risk factors for coronary disease.  I feel plain treadmill stress test is adequate to evaluate for coronary ischemia. I think some of his palpitations are related to decongestant use with recent sinusitis, increased use of beta agonists for his asthma, and stress. If his treadmill exercise test is negative, would recommend increased exercise which will help with anxiety and palpitations as well as stress management and overall cardiovascular conditioning.  Chrystie Nose, MD, [facility] Attending Cardiologist [facility]  Jerry Horton,Jerry Horton 04/19/2013, 2:04 PM

## 2013-04-19 NOTE — Patient Instructions (Signed)
Your physician has requested that you have a treadmill exercise stress test. For further information please visit [email or web address] Please follow instruction sheet, as given.

## 2013-04-26 ENCOUNTER — Ambulatory Visit (HOSPITAL_COMMUNITY)
Admission: RE | Admit: 2013-04-26 | Discharge: 2013-04-26 | Disposition: A | Discharge status: Home / Self Care | Payer: 59 | Source: Ambulatory Visit | Attending: Internal Medicine | Admitting: Internal Medicine

## 2013-04-26 DIAGNOSIS — R0789 Other chest pain: Secondary | ICD-10-CM

## 2013-04-26 DIAGNOSIS — R079 Chest pain, unspecified: Secondary | ICD-10-CM | POA: Insufficient documentation

## 2013-04-26 DIAGNOSIS — R002 Palpitations: Secondary | ICD-10-CM | POA: Insufficient documentation

## 2013-05-01 ENCOUNTER — Other Ambulatory Visit: Payer: Self-pay | Admitting: *Deleted

## 2013-05-01 ENCOUNTER — Telehealth: Payer: Self-pay | Admitting: *Deleted

## 2013-05-01 DIAGNOSIS — R002 Palpitations: Secondary | ICD-10-CM

## 2013-05-01 DIAGNOSIS — R0789 Other chest pain: Secondary | ICD-10-CM

## 2013-05-01 NOTE — Telephone Encounter (Signed)
Called patient about exercise tolerance test & request to schedule exercise nuc. Pt verbalized understanding and agreed to be schedule for test. 05/01/13

## 2013-05-09 ENCOUNTER — Ambulatory Visit (HOSPITAL_COMMUNITY)
Admission: RE | Admit: 2013-05-09 | Discharge: 2013-05-09 | Disposition: A | Discharge status: Home / Self Care | Payer: 59 | Source: Ambulatory Visit | Attending: Internal Medicine | Admitting: Internal Medicine

## 2013-05-09 DIAGNOSIS — E663 Overweight: Secondary | ICD-10-CM | POA: Insufficient documentation

## 2013-05-09 DIAGNOSIS — R079 Chest pain, unspecified: Secondary | ICD-10-CM | POA: Insufficient documentation

## 2013-05-09 DIAGNOSIS — R42 Dizziness and giddiness: Secondary | ICD-10-CM | POA: Insufficient documentation

## 2013-05-09 DIAGNOSIS — R002 Palpitations: Secondary | ICD-10-CM

## 2013-05-09 DIAGNOSIS — R0789 Other chest pain: Secondary | ICD-10-CM

## 2013-05-09 DIAGNOSIS — R0602 Shortness of breath: Secondary | ICD-10-CM | POA: Insufficient documentation

## 2013-05-09 MED ORDER — TECHNETIUM TC 99M SESTAMIBI GENERIC - CARDIOLITE
30.0000 | Freq: Once | INTRAVENOUS | Status: AC | PRN
  Administered 2013-05-09: 30 via INTRAVENOUS

## 2013-05-09 MED ORDER — TECHNETIUM TC 99M SESTAMIBI GENERIC - CARDIOLITE
10.0000 | Freq: Once | INTRAVENOUS | Status: AC | PRN
  Administered 2013-05-09: 10 via INTRAVENOUS

## 2013-05-09 NOTE — Procedures (Addendum)
[facility] [address] [place] [address] [phone]  Cardiology Nuclear Med Study  Jerry REWERTS is a 52 y.o. male     MRN : [id number]     DOB: [birth year]  Procedure Date: 05/09/2013  Nuclear Med Background Indication for Stress Test:  Evaluation for Ischemia and Abnormal EKG History:  Asthma Cardiac Risk Factors: Overweight  Symptoms:  Chest Pain, Dizziness, Light-Headedness, Palpitations and SOB   Nuclear Pre-Procedure Caffeine/Decaff Intake:  10:00pm NPO After: 8:00am   IV Site: R Forearm  IV 0.9% NS with Angio Cath:  22g  Chest Size (in):  24'  IV Started by: Emmit Pomfret, RN  Height: 5\' 9"  (1.753 m)  Cup Size: n/a  BMI:  Body mass index is 25.09 kg/(m^2). Weight:  170 lb (77.111 kg)   Tech Comments:  N/A    Nuclear Med Study 1 or 2 day study: 1 day  Stress Test Type:  Stress  Order Authorizing Provider:  Zoila Shutter, MD   Resting Radionuclide: Technetium 35m Sestamibi  Resting Radionuclide Dose: 10.7 mCi   Stress Radionuclide:  Technetium 64m Sestamibi  Stress Radionuclide Dose: 30.7 mCi           Stress Protocol Rest HR: 67 Stress HR: 150  Rest BP: 114/81 Stress BP: 171/81  Exercise Time (min): 9 METS: 10.1   Predicted Max HR: 170 bpm % Max HR: 88.24 bpm Rate Pressure Product: 88416  Dose of Adenosine (mg):  n/a Dose of Lexiscan: n/a mg  Dose of Atropine (mg): n/a Dose of Dobutamine: n/a mcg/kg/min (at max HR)  Stress Test Technologist: Esperanza Sheets, CCT Nuclear Technologist: Koren Shiver, CNMT   Rest Procedure:  Myocardial perfusion imaging was performed at rest 45 minutes following the intravenous administration of Technetium 59m Sestamibi. Stress Procedure:  The patient performed treadmill exercise using a Bruce  Protocol for 9 minutes. The patient stopped due to mild fatigue and denied any chest pain.  There were no significant ST-T wave changes.  Technetium 89m Sestamibi was  injected at peak exercise and myocardial perfusion imaging was performed after a brief delay.  Transient Ischemic Dilatation (Normal <1.22):  0.95 Lung/Heart Ratio (Normal <0.45):  0.24 QGS EDV:  70 ml QGS ESV:  23 ml LV Ejection Fraction: 67%     Rest ECG: NSR - Normal EKG  Stress ECG: 1 mm inferior lead horizontal ST segment depression at peak exercise  QPS Raw Data Images:  Normal; no motion artifact; normal heart/lung ratio. Stress Images:  Mild inferior wall reduction in tracer uptake. Rest Images:  Normal homogeneous uptake in all areas of the myocardium. Subtraction (SDS):  These findings are consistent with ischemia.  Impression Exercise Capacity:  Good exercise capacity. BP Response:  Normal blood pressure response. Clinical Symptoms:  No significant symptoms noted. ECG Impression:  Significant ST abnormalities consistent with ischemia. Comparison with Prior Nuclear Study: No significant change from previous study  Overall Impression:  Low risk stress nuclear study with mild inferior wall ischemia. and    LV Wall Motion:  NL LV Function; NL Wall Motion   Zamarion Longest, MD  05/09/2013 1:42 PM

## 2013-05-15 ENCOUNTER — Telehealth: Payer: Self-pay | Admitting: Internal Medicine

## 2013-05-16 ENCOUNTER — Encounter: Payer: Self-pay | Admitting: *Deleted

## 2013-05-17 ENCOUNTER — Encounter: Payer: Self-pay | Admitting: Internal Medicine

## 2013-05-17 ENCOUNTER — Ambulatory Visit (INDEPENDENT_AMBULATORY_CARE_PROVIDER_SITE_OTHER): Payer: 59 | Admitting: Internal Medicine

## 2013-05-17 VITALS — BP 130/86 | HR 66 | Ht 69.0 in | Wt 174.0 lb

## 2013-05-17 DIAGNOSIS — R002 Palpitations: Secondary | ICD-10-CM

## 2013-05-17 NOTE — Progress Notes (Signed)
OFFICE NOTE  Chief Complaint:  Routine follow-up  Primary Care Physician: Jerry Mcalpine, MD  HPI:  Jerry Horton is a 52 year old gentleman with a history of palpitations as well as atypical chest pain with a negative nuclear stress test in 2012. He also has asthma, but otherwise is doing fairly well. He has recently been having some palpitations; however, reported he thought they were related to chocolate, and he does report some palpitations when using his ProAir inhaler frequently. Overall he is doing well with very in frequent arrhythmias for which Cardizem seems to help.  PMHx:  Past Medical History  Diagnosis Date  . Allergic rhinitis, cause unspecified   . Palpitations OCCASIONAL  . Anxiety state, unspecified   . Epicondylitis, lateral, right CHRONIC  . MVP (mitral valve prolapse) OCC . PALPITATIONS  . Pulmonary nodule STABLE PER CT  . Asthma, moderate   . History of nuclear stress test 01/07/2011    negative   . History of echocardiogram 01/18/2007  . History of nuclear stress test 05/09/2013    mild inferior wall ischemia, ST abnormalities     Past Surgical History  Procedure Laterality Date  . Shoulder arthroscopy  01/2007    DrAplington--  RIGHT SHOULDER  . Resection of distal right clavicle & acromion  09/2010    DrAplington  . Open resection distal right clavicle and ac joint arthritis  09-23-2010    DR Jerry Horton  . Umbilical hernia repair  CHILD  . Repair extensor tendon  07/07/2012    Procedure: REPAIR EXTENSOR TENDON;  Surgeon: Jerry Schmidt, MD;  Location: [facility];  Service: Orthopedics;  Laterality: Right;  REPAIR OF COMMON EXTENSOR TENDON WITH PARTIAL LATERAL EPICONDYLECTOMY OF THE RIGHT ELBOW     FAMHx:  Family History  Problem Relation Age of Onset  . Hypertension Mother   . Cancer - Other Father     brain    SOCHx:   reports that he has never smoked. He has never used smokeless tobacco. He reports that he does not drink  alcohol or use illicit drugs.  ALLERGIES:  No Known Allergies  ROS: A comprehensive review of systems was negative except for: Cardiovascular: positive for palpitations  HOME MEDS: Current Outpatient Prescriptions  Medication Sig Dispense Refill  . ADVAIR DISKUS 250-50 MCG/DOSE AEPB INHALE 1 PUFF INTO THE LUNGS 2 TIMES DAILY.  60 each  11  . albuterol (PROAIR HFA) 108 (90 BASE) MCG/ACT inhaler Inhale 2 puffs into the lungs every 6 (six) hours as needed.  1 Inhaler  11  . diltiazem (CARDIZEM) 30 MG tablet Take 1 tablet by mouth Once daily as needed.      Marland Kitchen NEXIUM 20 MG capsule        No current facility-administered medications for this visit.    LABS/IMAGING: No results found for this or any previous visit (from the past 48 hour(s)). No results found.  VITALS: BP 130/86  Pulse 66  Ht 5\' 9"  (1.753 m)  Wt 174 lb (78.926 kg)  BMI 25.68 kg/m2  EXAM: General appearance: alert and no distress Neck: no adenopathy, no carotid bruit, no JVD, supple, symmetrical, trachea midline and thyroid not enlarged, symmetric, no tenderness/mass/nodules Lungs: clear to auscultation bilaterally Heart: regular rate and rhythm, S1, S2 normal, no murmur, click, rub or gallop Abdomen: soft, non-tender; bowel sounds normal; no masses,  no organomegaly Extremities: extremities normal, atraumatic, no cyanosis or edema Pulses: 2+ and symmetric Skin: Skin color, texture, turgor normal. No rashes or  lesions Neurologic: Grossly normal  EKG: deferred  ASSESSMENT: 1. Palpitations  PLAN: 1.   [name] palpitations have improved significantly. He only takes Cardizem occasionally as needed. We worked on triggers and try to reduce those in his diet which he says mostly is related to chocolate chip cookies. We can see him back only as needed.  Jerry Nose, MD, [facility] Attending Cardiologist [facility]  Jerry Horton C 05/17/2013, 5:24 PM

## 2013-05-17 NOTE — Patient Instructions (Addendum)
Your physician recommends that you schedule a follow-up appointment as needed  

## 2013-10-31 ENCOUNTER — Ambulatory Visit: Payer: 59 | Admitting: Pulmonary Disease

## 2014-01-01 ENCOUNTER — Other Ambulatory Visit (INDEPENDENT_AMBULATORY_CARE_PROVIDER_SITE_OTHER): Payer: 59

## 2014-01-01 ENCOUNTER — Ambulatory Visit (INDEPENDENT_AMBULATORY_CARE_PROVIDER_SITE_OTHER)
Admission: RE | Admit: 2014-01-01 | Discharge: 2014-01-01 | Disposition: A | Discharge status: Home / Self Care | Payer: 59 | Source: Ambulatory Visit | Attending: Pulmonary Disease | Admitting: Pulmonary Disease

## 2014-01-01 ENCOUNTER — Encounter: Payer: Self-pay | Admitting: Pulmonary Disease

## 2014-01-01 ENCOUNTER — Ambulatory Visit (INDEPENDENT_AMBULATORY_CARE_PROVIDER_SITE_OTHER): Payer: 59 | Admitting: Pulmonary Disease

## 2014-01-01 VITALS — BP 120/76 | HR 65 | Temp 97.0°F | Ht 69.5 in | Wt 176.4 lb

## 2014-01-01 DIAGNOSIS — R1013 Epigastric pain: Secondary | ICD-10-CM

## 2014-01-01 DIAGNOSIS — R002 Palpitations: Secondary | ICD-10-CM

## 2014-01-01 DIAGNOSIS — K3189 Other diseases of stomach and duodenum: Secondary | ICD-10-CM

## 2014-01-01 DIAGNOSIS — M545 Low back pain, unspecified: Secondary | ICD-10-CM

## 2014-01-01 DIAGNOSIS — R911 Solitary pulmonary nodule: Secondary | ICD-10-CM

## 2014-01-01 DIAGNOSIS — J45909 Unspecified asthma, uncomplicated: Secondary | ICD-10-CM

## 2014-01-01 DIAGNOSIS — J309 Allergic rhinitis, unspecified: Secondary | ICD-10-CM

## 2014-01-01 DIAGNOSIS — Z0000 Encounter for general adult medical examination without abnormal findings: Secondary | ICD-10-CM

## 2014-01-01 DIAGNOSIS — F411 Generalized anxiety disorder: Secondary | ICD-10-CM

## 2014-01-01 LAB — LIPID PANEL
Cholesterol: 158 mg/dL (ref 0–200)
HDL: 63 mg/dL (ref >39.00)
LDL Cholesterol: 88 mg/dL (ref 0–99)
Total CHOL/HDL Ratio: 3
Triglycerides: 37 mg/dL (ref 0.0–149.0)
VLDL: 7.4 mg/dL (ref 0.0–40.0)

## 2014-01-01 LAB — HEPATIC FUNCTION PANEL
ALT: 28 U/L (ref 0–53)
AST: 25 U/L (ref 0–37)
Albumin: 4.2 g/dL (ref 3.5–5.2)
Alkaline Phosphatase: 69 U/L (ref 39–117)
Bilirubin, Direct: 0.2 mg/dL (ref 0.0–0.3)
Total Bilirubin: 1.3 mg/dL — ABNORMAL HIGH (ref 0.3–1.2)
Total Protein: 7.5 g/dL (ref 6.0–8.3)

## 2014-01-01 LAB — BASIC METABOLIC PANEL
BUN: 17 mg/dL (ref 6–23)
CO2: 29 mEq/L (ref 19–32)
Calcium: 9.2 mg/dL (ref 8.4–10.5)
Chloride: 104 mEq/L (ref 96–112)
Creatinine, Ser: 1.2 mg/dL (ref 0.4–1.5)
GFR: 79.71 mL/min (ref >60.00)
Glucose, Bld: 90 mg/dL (ref 70–99)
Potassium: 4.2 mEq/L (ref 3.5–5.1)
Sodium: 139 mEq/L (ref 135–145)

## 2014-01-01 LAB — TSH: TSH: 1.9 u[IU]/mL (ref 0.35–5.50)

## 2014-01-01 LAB — CBC WITH DIFFERENTIAL/PLATELET
Basophils Absolute: 0 10*3/uL (ref 0.0–0.1)
Basophils Relative: 0.3 % (ref 0.0–3.0)
Eosinophils Absolute: 0.1 10*3/uL (ref 0.0–0.7)
Eosinophils Relative: 3.3 % (ref 0.0–5.0)
HCT: 43 % (ref 39.0–52.0)
Hemoglobin: 14.7 g/dL (ref 13.0–17.0)
Lymphocytes Relative: 31 % (ref 12.0–46.0)
Lymphs Abs: 1.3 10*3/uL (ref 0.7–4.0)
MCHC: 34.2 g/dL (ref 30.0–36.0)
MCV: 95.7 fl (ref 78.0–100.0)
Monocytes Absolute: 0.4 10*3/uL (ref 0.1–1.0)
Monocytes Relative: 9.2 % (ref 3.0–12.0)
Neutro Abs: 2.4 10*3/uL (ref 1.4–7.7)
Neutrophils Relative %: 56.2 % (ref 43.0–77.0)
Platelets: 205 10*3/uL (ref 150.0–400.0)
RBC: 4.49 Mil/uL (ref 4.22–5.81)
RDW: 13.4 % (ref 11.5–14.6)
WBC: 4.2 10*3/uL — ABNORMAL LOW (ref 4.5–10.5)

## 2014-01-01 MED ORDER — ALBUTEROL SULFATE HFA 108 (90 BASE) MCG/ACT IN AERS: 2.0000 | INHALATION_SPRAY | Freq: Four times a day (QID) | RESPIRATORY_TRACT | Status: DC | PRN

## 2014-01-01 MED ORDER — FLUTICASONE-SALMETEROL 100-50 MCG/DOSE IN AEPB: 1.0000 | INHALATION_SPRAY | Freq: Two times a day (BID) | RESPIRATORY_TRACT | Status: DC

## 2014-01-01 NOTE — Progress Notes (Signed)
Subjective:    Patient ID: Jerry Horton, male    DOB: May 31, 1961, 53 y.o.   MRN: [id number]  HPI 53 y/o BM here for a follow up visit... he has Asthma & allergies and a hx of VCD... he is also followed by DrBerry for [facility]- prev neg cardiac eval from [facility]...  ~  July 22, 2011: 388moROV & he returns due to recurrent breathing problem> he feels that his breathing capacity isn't what it used to be, not breathing as good as he used to, but not really SOB he says> hard for him to describe, states he doesn't have the breath to sing properly, notes an odd feeling in his throat if he jogs, & he's worried all this is due to something other than his asthma;  He does note the difficulty is more a prob getting the air "in" as opposed to "out" ie can't get a deep breath, but he denies nerves/ anxiety (except for anxiety over this problem);  He also c/o a "knot" pointing to the left clavicular head & notes an asymetry to the other side (exam is neg- clavicle feels normal);  [facility] and "no cause found";  He's also had some incr LBP w/ [place]...  We discussed all these issues and decided on further eval & a treatment plan ==> EVAL: check CXR, PFT, Labs, & CTChest for completeness;  RX: increase his ADVAIR to 250/50 Bid, add HYDROXYZINE 212mTid, and ROV in 6-8 wks...    AR & Asthma> hx mod airflow obstruction in past; supposed to be on Advair100Bid but only uses it once/d he says; also has hx VCD w/ prev treatment w/ Hydroxyzine which helped his symptoms...Marland KitchenMarland Kitchen  ?pulm nodule> old films w/ ?32m34module right mid zone; subseq films didn't show the lesion; pending CTChest will help delineate...    Hx CP/ Palpit> [facility] that occurred after rotator cuff surg by DrAplington; standard treadmill test was abnormal & he had a hypertensive BP response (but good exercise capacity noted); he was to return  for a Myoview scan> ?if this was done... He reports "no cause found" for palpit.    Dyspepsia> prev used Prilosec OTC as needed; he denies recent reflux symptoms, abd pain, etc...    GU>  He was seen 11/11 by DrTannenbaum for hx of pain in penis, left testis, & left groin area; noted to have Low-T (declined Rx) & ED (Staxyn prn)...    LBP> Followed by DrAplington for Ortho- he had right shoulder arthroscopy 4/08; and more recently resection distal clavicle head & part of acromion on 12/11; he tells me that he just had another MRI of his Lumbar spine & results are pending; now he is concerned regarding some asymmetry of his prox clavicular heads- he thinks the left is more prominent; my exam shows this area to appear wnl in my opinion; the CT Chest should allay his anxiety...    Anxiety> he does not endorse stress or anxiety as a cause of his unusual dyspnea; we discussed Rx w/ Hydroxyzine Pamoate 35m57md (he used this prev for his VCD w/ improvement).  ~  September 20, 2011:  88mo 2388mo& last visit his dyspnea seemed multifactorial> related to Asthma w/ %1sec=63 & reduced mid flows, and anxiety over his condition;  We treated him w/ Advair250Bid and Hydroxyzine 35mg 66mbut this made him  too sleepy so he decr to one Qhs;  It seems as thought he prev dyspnea is better as he did not mention those symptoms during today's visit, rather he says he is here due to facial pressure that he has felt over the last month; esp right max sinus area, sl tender on palp, ?worse at night w/ lots of congestion in the AM, sl HA, denies f/c/s;  He tells me he went to the [facility]2 w/ 2 diff antibiotic given but he doesn't recall the names & didn't bring bottles> he wants ENT referral & we discussed Rx w/ Depo/ Pred 3d taper, Saline nasal mist, Mucinex + Fluids, and refer per request...  ~  October 30, 2012:  28moROV & CPX> Ed reports a good year w/o new complaints or concerns;  He did see the Urology group 11/13 ([facility]) & he reports  that his PSA was wnl & we do not need to repeat that...  We reviewed the following medical problems during today's office visit>>     AR & Asthma> on Advair100Bid; he denies cough, sput, SOB, etc; hx mod airflow obstruction in past; also has hx VCD w/ prev treatment w/ Hydroxyzine which helped his symptoms; his dentist sent him to OralSurg, DrRehm- "white patches" were candida & resolved w/ Nystatin; reminded to rinse well after inhaler use...    ?pulm nodule> old films w/ ?92mnodule right mid zone; subseq films didn't show the lesion; CTChest 9/12 showed 41m71mUL nodule; he is asymptomatic- f/u CXR today shows norm heart size, clear lungs, no nodules seen...    Hx CP/ Palpit> on Diltiazem 50m69mn for palpit; [facility] that occurred after rotator cuff surg by DrAplington; standard treadmill test was abnormal & he had a hypertensive BP response (but good exercise capacity noted); he was to return for a Myoview> ?if this was done... He reports "no cause found" for the palpit.    Dyspepsia> prev used Prilosec OTC as needed; he denies recent reflux symptoms, abd pain, etc...    GU>  He was seen 11/13 by DWarden> c/o penile pain & felt to have ?IC ?prostatitis; he reports that PSA was wnl, told to avoid kool-aid & bladder irritants; given Mobic15/d & Robaxin500Bid...    LBP> Followed by DrAplington for Ortho- he had right shoulder arthroscopy 4/08; and more recently resection distal clavicle head & part of acromion on 12/11; he tells me that he just had another MRI of his Lumbar spine & results are pending from Ortho;  He had right tennis elbow surg 9/13 by DrAplington & improved...    Anxiety> we discussed Rx w/ Hydroxyzine Pamoate 25mg65m prn (he used this prev for his VCD w/ improvement)... We reviewed prob list, meds, xrays and labs> see below for updates >>   CXR 1/14 showed norm heart size, clear lungs, no change in distal clavicles...  LABS 1/14:  FLP- at goals on  diet alone;  Chems- wnl;  CBC- wnl;  TSH=0.87   ~  January 01, 2014:  141mo 29mo pt reports that he saw DrHilty, Cards 7/14 w/ palpit- he had GXT, Myoview & "everything looked normal" he says; symptoms resolved off soda, tea, choc, and on Cardizem 50mg p16m We reviewed the following medical problems during today's office visit >>     AR & Asthma> on Advair250Bid & ProairHFA prn; he denies cough, sput, SOB, etc; hx mod airflow obstruction in past; also has hx VCD  w/ prev treatment w/ Hydroxyzine which helped his symptoms; his dentist sent him to OralSurg, DrRehm- "white patches" were candida & resolved w/ Nystatin; reminded to rinse well after inhaler use...    ?pulm nodule> old films w/ ?86m nodule right mid zone; subseq films didn't show the lesion; CTChest 9/12 showed 431mRUL nodule; he is asymptomatic- f/u CXR today shows norm heart size, clear lungs, no nodules seen...    Hx CP/ Palpit> on Diltiazem 3052mrn for palpit; [facility] atypCP that occurred after rotator cuff surg by DrAplington (neg Myoview) & 7/14 for palpit=> standard treadmill test was abnormal & he had a hypertensive BP response (but good exercise capacity noted); he returned for Myoview> abn study w/ inferior ischemia- he reports "no cause found" for the palpit (they resolved off caffeine)...    Dyspepsia> prev used Prilosec OTC as needed; he denies recent reflux symptoms, abd pain, etc...    GU>  He was seen 11/13 by DWarden> c/o penile pain & felt to have ?IC ?prostatitis; he reports that PSA was wnl, told to avoid kool-aid & bladder irritants; given Mobic15/d & Robaxin500Bid...    LBP> Followed by DrAplington for Ortho- he had right shoulder arthroscopy 4/08; and more recently resection distal clavicle head & part of acromion on 12/11; he tells me that he just had another MRI of his Lumbar spine & results are pending from Ortho;  He had right tennis elbow surg 9/13 by DrAplington & improved...    Anxiety> we  discussed Rx w/ Hydroxyzine Pamoate 21m28md prn (he used this prev for his VCD w/ improvement)... We reviewed prob list, meds, xrays and labs> see below for updates >>   CXR 3/15 showed norm heart size, clear lungs, NAD...  LABS 3/15:  FLP- wnl on diet alone;  Chems- wnl;  CBC- wnl;  TSH=1.90           Problem List:  ALLERGIC RHINITIS (ICD-477.9) - he uses OTC Zyrtek as needed... ~  allergy testing in 1997 by DrSharma showed 4+ reactions to trees, grass, mold, dust, etc... ~  CT Sinuses 9/06 was neg/ WNL... ~  11/12: presents w/ sinus symptoms [facility]...  ASTHMA (ICD-493.90) & Hx of VOCAL CORD DISORDER (ICD-478.5) - on ADVAIR 250Bid now & PROAIR as needed... ~  baseline CXR shows clear lungs, NAD... ~  prev PFT's reveal mod airflow obstruction... ~  9/12:  He presented w/ dyspnea- hard for him to describe ==>  PFT 9/12 showed FVC=4.12 (99%), FEV1=2.58 (77%), %1sec=63, mid-flows=36%pred; Advair incr to 250Bid.  CXR 9/12 ==> clear, NAD, s/p surg on distal right clavicle...  CTChest 9/12 ==> neg, clear lungs, x 3.9mm 77m nodule noted, no adenopathy, & they rec f/u CT in 47yr t75yrcheck this nodule... ~  1/14: on Advair100Bid; he denies cough, sput, SOB, etc; hx mod airflow obstruction in past; also has hx VCD w/ prev treatment w/ Hydroxyzine which helped his symptoms; his dentist sent him to OralSurg, DrRehm- "white patches" were candida & resolved w/ Nystatin; reminded to rinse well after inhaler use... ~  3/15: on Advair250Bid & ProairHFA prn; he denies cough, sput, SOB, etc; hx mod airflow obstruction in past; also has hx VCD w/ prev treatment w/ Hydroxyzine which helped his symptoms...  ? of PULMONARY NODULE (ICD-518.89) - old films w/ ? 9mm no40me right mid lung zone superimposed on post aspect of 7th rib... no change serially... ~  CXR  9/10 showed stable 67m RUL nodule- no change. ~  CXR 8/11 showed clear lungs, no mention of the prev nodule,  NAD..Marland Kitchen ~  CXR & CTChest 9/12 ==> see above> CXR neg (no nodule seen) & CT w/ clear lungs x 3.964mRUL nodule noted, no adenopathy, & they rec f/u CT in 1y79yr recheck this nodule... ~  1/14:  CXR 1/14 showed norm heart size, clear lungs, no nodules seen, no change in distal clavicles... ~  3/15:  CXR 3/15 showed norm heart size, clear lungs, NAD...  Hx of CHEST PAIN, ATYPICAL (ICD-786.59) & Hx of PALPITATIONS (ICD-785.1) - [facility] & then DrBerry, & now DrHilty... ? MVP by Echo in 2000... neg Cardiolite 2002... CC is palpit that are worse w/ caffeine, choc, etc... ~  baseline EKG shows NSR, WNL... Marland Kitchen  full eval by Cards 2008 w/ Holter, 2DEcho, Myoview- all normal... ~  Myoview 3/08 was normal- no ischemia, no infarction, EF= 67%... ~  8/11: denies CP, palpit, SOB, etc... [facility] ~  2/12: he developed some CWP after rotator cuff surg by DrAplington & saw DrHilty for Cards eval; he had an abn standard bruce protocol treadmill test & was supposed to have a Myoview done after that> ?if done, ?results... ~  Plain old treadmill test7/14 showed 12 min exercise, reached target HR, no CP or SOB reported, 1-2mm40m depression inferiorly- felt to be moderate risk & felt they should proceed w/ Myoview... ~  Myoview 7/14 showed exerc on treadmill for 9min29mtopped for fatigue/ no CP; 1mm S30m depression, ?mild inferior wall ischemia & norm wall motion- DrCroitoru called it a low risk study & asked to f/u to discuss poss cath...  ~  7/14: he had OV f/u w/ DrHilty> note reviewed, this OV was after his Myoview but results not mentions in note; palpit resolved off caffeine- told to ret PRN, pt states that he was told everything was OK...  Hx of DYSPEPSIA (ICD-536.8) - uses OMEPRAZOLE 20mg a70meded... prev evals by DrPerry... ~  last EGD 11/03 was WNL... ~  3/15:  We will refer to GI for colonoscopy...  GU > Followed by DrTannenbaum for hx of pain in penis, left  testis, & left groin area; noted to have Low-T (declined Rx) & ED (Staxyn prn)... ~  1/15: he had f/u DrTannenbaum> ED, Low-T, hx prostatitis, hx pain in penis & groin; PSA= 1.21; he doesn't want meds...   Right Shoulder Pain > Ortho eval by DrAplington w/ 2 prev surg: he had right shoulder arthroscopy 4/08; and more recently resection distal clavicle head & part of acromion on 12/11...  BACK PAIN, LUMBAR (ICD-724.2) - eval 7/09 from DrAplington w/ MRI L/S spine showing L2 S1 disc bulging without compression... he still c/o discomfort in the muscles in his left leg... he will f/u w/ Ortho & Neuro for this...  ANXIETY (ICD-300.00) - we have rec that he take HYDROXYZINE (Vistaril) 25mg Ti52mt it made him too sleepy & he decr to 1 Qhs...  Family Hx of OTH SYMPTOMS INVLV NERV&MUSCULOSKELETAL SYSTEMS (ICD-781.99) - he has a brohter w/ ?myotonia, and a grandparent w/ hx of myasthenia gravis... eval by DrWillis in 2007 was neg- pt was concerned about some fatigue... ~  EMG/ NCV 4/07 by DrWillis was totally normal w/o neuropathy or muscle disorder...   Past Surgical History  Procedure Laterality Date  . Shoulder arthroscopy  01/2007    DrAplington--  RIGHT  SHOULDER  . Resection of distal right clavicle & acromion  09/2010    DrAplington  . Open resection distal right clavicle and ac joint arthritis  09-23-2010    DR Shellia Carwin  . Umbilical hernia repair  CHILD  . Repair extensor tendon  07/07/2012    Procedure: REPAIR EXTENSOR TENDON;  Surgeon: Magnus Sinning, MD;  [facility];  Service: Orthopedics;  Laterality: Right;  REPAIR OF COMMON EXTENSOR TENDON WITH PARTIAL LATERAL EPICONDYLECTOMY OF THE RIGHT ELBOW     Outpatient Encounter Prescriptions as of 01/01/2014  Medication Sig  . albuterol (PROAIR HFA) 108 (90 BASE) MCG/ACT inhaler Inhale 2 puffs into the lungs every 6 (six) hours as needed.  . diltiazem (CARDIZEM) 30 MG tablet Take 1 tablet by mouth Once daily as  needed.  . [DISCONTINUED] ADVAIR DISKUS 250-50 MCG/DOSE AEPB INHALE 1 PUFF INTO THE LUNGS 2 TIMES DAILY.  . [DISCONTINUED] albuterol (PROAIR HFA) 108 (90 BASE) MCG/ACT inhaler Inhale 2 puffs into the lungs every 6 (six) hours as needed.  . Fluticasone-Salmeterol (ADVAIR DISKUS) 100-50 MCG/DOSE AEPB Inhale 1 puff into the lungs 2 (two) times daily.  . [DISCONTINUED] NEXIUM 20 MG capsule     No Known Allergies   Current Medications, Allergies, Past Medical History, Past Surgical History, Family History, and Social History were reviewed in [employer] record.    Review of Systems       The patient denies fever, chills, sweats, anorexia, fatigue, weakness, malaise, weight loss, sleep disorder, blurring, diplopia, eye irritation, eye discharge, vision loss, eye pain, photophobia, earache, ear discharge, tinnitus, decreased hearing, nasal congestion, nosebleeds, sore throat, hoarseness, chest pain, palpitations, syncope, dyspnea on exertion, orthopnea, PND, peripheral edema, cough, dyspnea at rest, excessive sputum, hemoptysis, wheezing, pleurisy, nausea, vomiting, diarrhea, constipation, change in bowel habits, abdominal pain, melena, hematochezia, jaundice, gas/bloating, indigestion/heartburn, dysphagia, odynophagia, dysuria, hematuria, urinary frequency, urinary hesitancy, nocturia, incontinence, back pain, joint pain, joint swelling, muscle cramps, muscle weakness, stiffness, arthritis, sciatica, restless legs, leg pain at night, leg pain with exertion, rash, itching, dryness, suspicious lesions, paralysis, paresthesias, seizures, tremors, vertigo, transient blindness, frequent falls, frequent headaches, difficulty walking, depression, anxiety, memory loss, confusion, cold intolerance, heat intolerance, polydipsia, polyphagia, polyuria, unusual weight change, abnormal bruising, bleeding, enlarged lymph nodes, urticaria, allergic rash, hay fever, and recurrent infections.      Objective:   Physical Exam    WD, WN, 53 y/o BM in NAD... GENERAL:  Alert & oriented; pleasant & cooperative... HEENT:  [place]/AT, EOM-wnl, PERRLA, Fundi-benign, EACs-clear, TMs-wnl, NOSE-clear, THROAT-clear & wnl. NECK:  Supple w/ full ROM; no JVD; normal carotid impulses w/o bruits; no thyromegaly or nodules palpated; no lymphadenopathy. CHEST:  Clear to P & A; without wheezes/ rales/ or rhonchi heard... HEART:  Regular Rhythm; without murmurs/ rubs/ or gallops detected... ABDOMEN:  Soft & nontender; normal bowel sounds; no organomegaly or masses palpated... RECTAL:  Neg - prostate 3+ & nontender w/o nodules; stool hematest neg. EXT: without deformities or arthritic changes; no varicose veins/ venous insuffic/ or edema... some swelling & tender in IP joint of right thumb. NEURO:  CN's intact; motor testing normal; sensory testing normal; gait normal & balance OK. DERM:  No lesions noted; no rash etc...   LAB DATA FROM 9/12:   CXR:  ==> clear, NAD, nodule not seen on CXR, s/p surg on distal right clavicle...  PFT:   FVC=4.12 (99%), FEV1=2.58 (77%), %1sec=63, mid-flows=36%pred...  LABS:   ==> all WNL...  CTChest:  ==> neg,  clear lungs x 3.62m RUL nodule noted, no adenopathy, & they rec f/u CT in 165yro recheck this nodule...   Assessment & Plan:    Sinusitis>  He presents w/ sinus symptoms but no better after [facility], rather infl[facility] & we will Rx w/ Depo/ Pres, Saline, Mucinex, etc; refer to ENT per pt request...  AR & Asthma>  PFT confirmed mod airflow obstruction; now on ADVAIR 250Bid regularly; also has hx VCD, & his unusual dyspnea seems better on HYDROXYZINE PAMOATE 2540mid as needed...     Pulm nodule> old films w/ ?9mm35mdule right mid zone; subseq films didn't show the lesion; CT Chest 9/12 w/ 4mm 51m nodule & f/u 68yr i53yrggested=> serial CXRs have been neg...   Hx CP/ Palpit> he was [facility] atypCP that  occurred after shoulder surg by DrAplington; standard treadmill test was abnormal & he had a hypertensive BP response (but good exercise capacity noted); he was to return for a Myoview scan> [facility] Repeat eval 7/14 by DrHilty for Palpit which resolved off caffeine- Treadmill=> Myoview were abnormal (SEE REPORTS), pt indicates that all was neg & told to ret prn...     Dyspepsia> prev used Prilosec OTC as needed; he denies recent reflux symptoms, abd pain, etc...     GU>  He was seen 11/11 by DrTannenbaum for hx of pain in penis, left testis, & left groin area; noted to have Low-T (declined Rx) & ED (Staxyn prn)...     LBP> Followed by DrAplington for Ortho- he had right shoulder arthroscopy 4/08; and more recently resection distal clavicle head & part of acromion on 12/11; he tells me that he just had another MRI of his Lumbar spine & results are pending; now he is concerned regarding some asymmetry of his prox clavicular heads- he thinks the left is more prominent; my exam shows this area to appear wnl in my opinion; the CT Chest should allay his anxiety...     Anxiety> he does not endorse stress or anxiety as a cause of his unusual dyspnea; we discussed Rx w/ Hydroxyzine Pamoate 25mg T84mhe used this prev for his VCD w/ improvement).   Patient's Medications  New Prescriptions   FLUTICASONE-SALMETEROL (ADVAIR DISKUS) 100-50 MCG/DOSE AEPB    Inhale 1 puff into the lungs 2 (two) times daily.  Previous Medications   DILTIAZEM (CARDIZEM) 30 MG TABLET    Take 1 tablet by mouth Once daily as needed.  Modified Medications   Modified Medication Previous Medication   ALBUTEROL (PROAIR HFA) 108 (90 BASE) MCG/ACT INHALER albuterol (PROAIR HFA) 108 (90 BASE) MCG/ACT inhaler      Inhale 2 puffs into the lungs every 6 (six) hours as needed.    Inhale 2 puffs into the lungs every 6 (six) hours as needed.  Discontinued Medications   ADVAIR DISKUS 250-50 MCG/DOSE AEPB    INHALE 1  PUFF INTO THE LUNGS 2 TIMES DAILY.   NEXIUM 20 MG CAPSULE

## 2014-01-01 NOTE — Patient Instructions (Addendum)
Today we updated your med list in our EPIC system...    We decided to step-down your ADVAIR to the [place]- still one inhalation twice daily...    We refilled your ProairHFA for as needed use...  Today we did your follow up CXR & FASTING blood work...    We will contact you w/ the results when available...   We will refer your chart to drPerry for a routine colonoscopy at your convenience...  Call for any questions...  Let's plan a follow up visit in 36yr to f/u your asthma, but call anytime for problems in the interim.Marland KitchenMarland Kitchen

## 2014-01-18 ENCOUNTER — Encounter: Payer: Self-pay | Admitting: Internal Medicine

## 2014-02-13 NOTE — Telephone Encounter (Signed)
Encounter has been closed--TP 02/13/14 

## 2014-02-25 ENCOUNTER — Ambulatory Visit (AMBULATORY_SURGERY_CENTER): Payer: Self-pay | Admitting: *Deleted

## 2014-02-25 VITALS — Ht 69.0 in | Wt 173.8 lb

## 2014-02-25 DIAGNOSIS — Z1211 Encounter for screening for malignant neoplasm of colon: Secondary | ICD-10-CM

## 2014-02-25 MED ORDER — MOVIPREP 100 G PO SOLR: ORAL | Status: DC

## 2014-02-25 NOTE — Progress Notes (Signed)
Patient denies any allergies to eggs or soy. Patient denies any problems with anesthesia/sedation. Pt denies any oxygen use at home. Does not take diet/weight loss medications. EMMI education assisgned to patient on colonoscopy. 

## 2014-03-04 ENCOUNTER — Encounter: Payer: Self-pay | Admitting: Internal Medicine

## 2014-03-11 ENCOUNTER — Encounter: Payer: Self-pay | Admitting: Internal Medicine

## 2014-03-11 ENCOUNTER — Ambulatory Visit (AMBULATORY_SURGERY_CENTER): Payer: 59 | Admitting: Internal Medicine

## 2014-03-11 VITALS — BP 127/71 | HR 61 | Temp 97.0°F | Resp 17 | Ht 69.0 in | Wt 173.0 lb

## 2014-03-11 DIAGNOSIS — D126 Benign neoplasm of colon, unspecified: Secondary | ICD-10-CM

## 2014-03-11 DIAGNOSIS — Z1211 Encounter for screening for malignant neoplasm of colon: Secondary | ICD-10-CM

## 2014-03-11 MED ORDER — SODIUM CHLORIDE 0.9 % IV SOLN: 500.0000 mL | INTRAVENOUS | Status: DC

## 2014-03-11 NOTE — Op Note (Signed)
[facility]  [address] [place], [id number]   COLONOSCOPY PROCEDURE REPORT  PATIENT: Quince, Santana  MR#: [id number] BIRTHDATE: 14-Jun-1961 , 52  yrs. old GENDER: Male ENDOSCOPIST: Eustace Quail, MD REFERRED KK:[id number] Lenna Gilford, M.D. PROCEDURE DATE:  03/11/2014 PROCEDURE:   Colonoscopy with snare polypectomy x 1 First Screening Colonoscopy - Avg.  risk and is 50 yrs.  old or older Yes.  Prior Negative Screening - Now for repeat screening. N/A  History of Adenoma - Now for follow-up colonoscopy & has been > or = to 3 yrs.  N/A  Polyps Removed Today? Yes. ASA CLASS:   Class II INDICATIONS:average risk screening. MEDICATIONS: MAC sedation, administered by CRNA and propofol (Diprivan) 240mg  IV  DESCRIPTION OF PROCEDURE:   After the risks benefits and alternatives of the procedure were thoroughly explained, informed consent was obtained.  A digital rectal exam revealed no abnormalities of the rectum.   The LB EX-[id number]  endoscope was introduced through the anus and advanced to the cecum, which was identified by both the appendix and ileocecal valve. No adverse events experienced.   The quality of the prep was excellent, using MoviPrep  The instrument was then slowly withdrawn as the colon was fully examined.   COLON FINDINGS: A diminutive polyp was found in the transverse colon.  A polypectomy was performed with a cold snare.  The resection was complete and the polyp tissue was completely retrieved.   Moderate diverticulosis was noted  in the right colon and The  left colon.   The colon mucosa was otherwise normal. Retroflexed views revealed no abnormalities. The time to cecum=2 minutes 36 seconds.  Withdrawal time=9 minutes 34 seconds.  The scope was withdrawn and the procedure completed. COMPLICATIONS: There were no complications.  ENDOSCOPIC IMPRESSION: 1.   Diminutive polyp was found in the transverse colon; polypectomy was performed with a cold  snare 2.   Moderate diverticulosis was noted in the right colon and left colon 3.   The colon mucosa was otherwise normal  RECOMMENDATIONS: 1. Repeat colonoscopy in 5 years if polyp adenomatous; otherwise 10 years   eSigned:  Eustace Quail, MD 03/11/2014 8:26 AM   cc: Noralee Space, MD and The Patient

## 2014-03-11 NOTE — Progress Notes (Signed)
A/ox3 pleased with MAC, report to Celia RN 

## 2014-03-11 NOTE — Patient Instructions (Signed)
Discharge instructions given with verbal understanding. Handouts on polyps and diverticulosis. Resume previous medications. YOU HAD AN ENDOSCOPIC PROCEDURE TODAY AT THE [facility]: Refer to the procedure report that was given to you for any specific questions about what was found during the examination.  If the procedure report does not answer your questions, please call your gastroenterologist to clarify.  If you requested that your care partner not be given the details of your procedure findings, then the procedure report has been included in a sealed envelope for you to review at your convenience later.  YOU SHOULD EXPECT: Some feelings of bloating in the abdomen. Passage of more gas than usual.  Walking can help get rid of the air that was put into your GI tract during the procedure and reduce the bloating. If you had a lower endoscopy (such as a colonoscopy or flexible sigmoidoscopy) you may notice spotting of blood in your stool or on the toilet paper. If you underwent a bowel prep for your procedure, then you may not have a normal bowel movement for a few days.  DIET: Your first meal following the procedure should be a light meal and then it is ok to progress to your normal diet.  A half-sandwich or bowl of soup is an example of a good first meal.  Heavy or fried foods are harder to digest and may make you feel nauseous or bloated.  Likewise meals heavy in dairy and vegetables can cause extra gas to form and this can also increase the bloating.  Drink plenty of fluids but you should avoid alcoholic beverages for 24 hours.  ACTIVITY: Your care partner should take you home directly after the procedure.  You should plan to take it easy, moving slowly for the rest of the day.  You can resume normal activity the day after the procedure however you should NOT DRIVE or use heavy machinery for 24 hours (because of the sedation medicines used during the test).    SYMPTOMS TO REPORT  IMMEDIATELY: A gastroenterologist can be reached at any hour.  During normal business hours, 8:30 AM to 5:00 PM Monday through Friday, call [phone].  After hours and on weekends, please call the GI answering service at [phone] who will take a message and have the physician on call contact you.   Following lower endoscopy (colonoscopy or flexible sigmoidoscopy):  Excessive amounts of blood in the stool  Significant tenderness or worsening of abdominal pains  Swelling of the abdomen that is new, acute  Fever of 100F or higher  FOLLOW UP: If any biopsies were taken you will be contacted by phone or by letter within the next 1-3 weeks.  Call your gastroenterologist if you have not heard about the biopsies in 3 weeks.  Our staff will call the home number listed on your records the next business day following your procedure to check on you and address any questions or concerns that you may have at that time regarding the information given to you following your procedure. This is a courtesy call and so if there is no answer at the home number and we have not heard from you through the emergency physician on call, we will assume that you have returned to your regular daily activities without incident.  SIGNATURES/CONFIDENTIALITY: You and/or your care partner have signed paperwork which will be entered into your electronic medical record.  These signatures attest to the fact that that the information above on your After Visit Summary   has been reviewed and is understood.  Full responsibility of the confidentiality of this discharge information lies with you and/or your care-partner. 

## 2014-03-11 NOTE — Progress Notes (Signed)
Called to room to assist during endoscopic procedure.  Patient ID and intended procedure confirmed with present staff. Received instructions for my participation in the procedure from the performing physician.  

## 2014-03-12 ENCOUNTER — Telehealth: Payer: Self-pay

## 2014-03-12 NOTE — Telephone Encounter (Signed)
Left a message at [phone] for the pt to call back if any questions or concerns. Maw

## 2014-03-15 ENCOUNTER — Encounter: Payer: Self-pay | Admitting: Internal Medicine

## 2014-06-19 ENCOUNTER — Other Ambulatory Visit: Payer: Self-pay | Admitting: Neurosurgery

## 2014-06-19 DIAGNOSIS — M48061 Spinal stenosis, lumbar region without neurogenic claudication: Secondary | ICD-10-CM

## 2014-06-28 ENCOUNTER — Ambulatory Visit
Admission: RE | Admit: 2014-06-28 | Discharge: 2014-06-28 | Disposition: A | Discharge status: Home / Self Care | Payer: 59 | Source: Ambulatory Visit | Attending: Neurosurgery | Admitting: Neurosurgery

## 2014-06-28 DIAGNOSIS — M48061 Spinal stenosis, lumbar region without neurogenic claudication: Secondary | ICD-10-CM

## 2014-06-28 MED ORDER — METHYLPREDNISOLONE ACETATE 40 MG/ML INJ SUSP (RADIOLOG
120.0000 mg | Freq: Once | INTRAMUSCULAR | Status: AC
  Administered 2014-06-28: 120 mg via EPIDURAL

## 2014-06-28 MED ORDER — IOHEXOL 180 MG/ML  SOLN
1.0000 mL | Freq: Once | INTRAMUSCULAR | Status: AC | PRN
  Administered 2014-06-28: 1 mL via INTRAVENOUS

## 2014-06-28 NOTE — Discharge Instructions (Signed)

## 2014-11-24 ENCOUNTER — Other Ambulatory Visit: Payer: Self-pay | Admitting: Pulmonary Disease

## 2015-01-02 ENCOUNTER — Ambulatory Visit (INDEPENDENT_AMBULATORY_CARE_PROVIDER_SITE_OTHER): Payer: 59 | Admitting: Pulmonary Disease

## 2015-01-02 ENCOUNTER — Encounter: Payer: Self-pay | Admitting: Pulmonary Disease

## 2015-01-02 ENCOUNTER — Ambulatory Visit (INDEPENDENT_AMBULATORY_CARE_PROVIDER_SITE_OTHER)
Admission: RE | Admit: 2015-01-02 | Discharge: 2015-01-02 | Disposition: A | Discharge status: Home / Self Care | Payer: 59 | Source: Ambulatory Visit | Attending: Pulmonary Disease | Admitting: Pulmonary Disease

## 2015-01-02 ENCOUNTER — Encounter (INDEPENDENT_AMBULATORY_CARE_PROVIDER_SITE_OTHER): Payer: Self-pay

## 2015-01-02 VITALS — BP 132/78 | HR 70 | Temp 97.6°F | Ht 69.5 in | Wt 181.4 lb

## 2015-01-02 DIAGNOSIS — J4520 Mild intermittent asthma, uncomplicated: Secondary | ICD-10-CM

## 2015-01-02 DIAGNOSIS — Z13220 Encounter for screening for lipoid disorders: Secondary | ICD-10-CM | POA: Insufficient documentation

## 2015-01-02 DIAGNOSIS — R911 Solitary pulmonary nodule: Secondary | ICD-10-CM

## 2015-01-02 DIAGNOSIS — J301 Allergic rhinitis due to pollen: Secondary | ICD-10-CM

## 2015-01-02 DIAGNOSIS — Z0000 Encounter for general adult medical examination without abnormal findings: Secondary | ICD-10-CM

## 2015-01-02 DIAGNOSIS — R002 Palpitations: Secondary | ICD-10-CM

## 2015-01-02 MED ORDER — METHYLPREDNISOLONE ACETATE 80 MG/ML IJ SUSP
80.0000 mg | Freq: Once | INTRAMUSCULAR | Status: AC
  Administered 2015-01-02: 80 mg via INTRAMUSCULAR

## 2015-01-02 MED ORDER — AZITHROMYCIN 250 MG PO TABS: ORAL_TABLET | ORAL | Status: DC

## 2015-01-02 MED ORDER — PREDNISONE (PAK) 5 MG PO TABS: ORAL_TABLET | ORAL | Status: DC

## 2015-01-02 MED ORDER — ALBUTEROL SULFATE HFA 108 (90 BASE) MCG/ACT IN AERS: 2.0000 | INHALATION_SPRAY | Freq: Four times a day (QID) | RESPIRATORY_TRACT | Status: DC | PRN

## 2015-01-02 NOTE — Progress Notes (Addendum)
Subjective:    Patient ID: Jerry Horton, male    DOB: 30-Sep-1961, 54 y.o.   MRN: [id number]  HPI 54 y/o BM here for a follow up visit... he has Asthma & allergies and a hx of VCD... he is also followed by DrBerry for [facility]- prev neg cardiac eval from [facility]... ~  SEE PREV EPIC NOTES FOR OLDER DATA >>   ~  September 20, 2011:  36moROV & last visit his dyspnea seemed multifactorial> related to Asthma w/ %1sec=63 & reduced mid flows, and anxiety over his condition;  We treated him w/ Advair250Bid and Hydroxyzine 287mTid but this made him too sleepy so he decr to one Qhs;  It seems as thought he prev dyspnea is better as he did not mention those symptoms during today's visit, rather he says he is here due to facial pressure that he has felt over the last month; esp right max sinus area, sl tender on palp, ?worse at night w/ lots of congestion in the AM, sl HA, denies f/c/s;  [facility] given but he doesn't recall the names & didn't bring bottles> he wants ENT referral & we discussed Rx w/ Depo/ Pred 3d taper, Saline nasal mist, Mucinex + Fluids, and refer per request...  ~  October 30, 2012:  1383moV & CPX> [name] reports a good year w/o new complaints or concerns;  He did see the Urology group 11/13 ([place]) & he reports that his PSA was wnl & we do not need to repeat that...  We reviewed the following medical problems during today's office visit>>     AR & Asthma> on Advair100Bid; he denies cough, sput, SOB, etc; hx mod airflow obstruction in past; also has hx VCD w/ prev treatment w/ Hydroxyzine which helped his symptoms; his dentist sent him to OralSurg, DrRehm- "white patches" were candida & resolved w/ Nystatin; reminded to rinse well after inhaler use...    ?pulm nodule> old films w/ ?9mm64mdule right mid zone; subseq films didn't show the lesion; CTChest 9/12 showed 4mm 55m nodule; he is asymptomatic- f/u CXR today shows norm heart size, clear lungs, no  nodules seen...    Hx CP/ Palpit> on Diltiazem 30mg 16mfor palpit; [facility] rotator cuff surg by DrAplington; standard treadmill test was abnormal & he had a hypertensive BP response (but good exercise capacity noted); he was to return for a Myoview> ?if this was done... He reports "no cause found" for the palpit.    Dyspepsia> prev used Prilosec OTC as needed; he denies recent reflux symptoms, abd pain, etc...    GU>  He was seen 11/13 by DWarden> c/o penile pain & felt to have ?IC ?prostatitis; he reports that PSA was wnl, told to avoid kool-aid & bladder irritants; given Mobic15/d & Robaxin500Bid...    LBP> Followed by DrAplington for Ortho- he had right shoulder arthroscopy 4/08; and more recently resection distal clavicle head & part of acromion on 12/11; he tells me that he just had another MRI of his Lumbar spine & results are pending from Ortho;  He had right tennis elbow surg 9/13 by DrAplington & improved...    Anxiety> we discussed Rx w/ Hydroxyzine Pamoate 25mg T46mrn (he used this prev for his VCD w/ improvement)... We reviewed prob list, meds, xrays and labs> see below for updates >>   CXR 1/14 showed  norm heart size, clear lungs, no change in distal clavicles...  LABS 1/14:  FLP- at goals on diet alone;  Chems- wnl;  CBC- wnl;  TSH=0.87   ~  January 01, 2014:  58mo ROV & pt reports that he saw DrHilty, Cards 7/14 w/ palpit- he had GXT, Myoview & "everything looked normal" he says; symptoms resolved off soda, tea, choc, and on Cardizem 30mg  prn;  We reviewed the following medical problems during today's office visit >>     AR & Asthma> on Advair250Bid & ProairHFA prn; he denies cough, sput, SOB, etc; hx mod airflow obstruction in past; also has hx VCD w/ prev treatment w/ Hydroxyzine which helped his symptoms; his dentist sent him to OralSurg, DrRehm- "white patches" were candida & resolved w/ Nystatin; reminded to rinse well after  inhaler use...    ?pulm nodule> old films w/ ?37mm nodule right mid zone; subseq films didn't show the lesion; CTChest 9/12 showed 66mm RUL nodule; he is asymptomatic- f/u CXR today shows norm heart size, clear lungs, no nodules seen...    Hx CP/ Palpit> on Diltiazem 30mg  prn for palpit; he was evaluated by [facility] 2/12 for atypCP that occurred after rotator cuff surg by DrAplington (neg Myoview) & 7/14 for palpit=> standard treadmill test was abnormal & he had a hypertensive BP response (but good exercise capacity noted); he returned for Myoview> abn study w/ inferior ischemia- he reports "no cause found" for the palpit (they resolved off caffeine)...    Dyspepsia> prev used Prilosec OTC as needed; he denies recent reflux symptoms, abd pain, etc...    GU>  He was seen 11/13 by DWarden> c/o penile pain & felt to have ?IC ?prostatitis; he reports that PSA was wnl, told to avoid kool-aid & bladder irritants; given Mobic15/d & Robaxin500Bid...    LBP> Followed by DrAplington for Ortho- he had right shoulder arthroscopy 4/08; and more recently resection distal clavicle head & part of acromion on 12/11; he tells me that he just had another MRI of his Lumbar spine & results are pending from Ortho;  He had right tennis elbow surg 9/13 by DrAplington & improved...    Anxiety> we discussed Rx w/ Hydroxyzine Pamoate 25mg  Tid prn (he used this prev for his VCD w/ improvement)... We reviewed prob list, meds, xrays and labs> see below for updates >>   CXR 3/15 showed norm heart size, clear lungs, NAD...  LABS 3/15:  FLP- wnl on diet alone;  Chems- wnl;  CBC- wnl;  TSH=1.90  ~  January 02, 2015:  Yearly ROV & CPX> [name] reports a good yr overall, only c/o recent sinus infection w/ blowing yellow mucus x1d; stopped-up, denies f/c/s, but has sl cough w/ beige phlegm as well, denies CP/ SOB/ etc... We reviewed the following medical problems during today's office visit >>     AR & Asthma> on Advair100Bid & ProairHFA prn; hx  mod airflow obstruction in past; also has hx VCD w/ prev treatment w/ Hydroxyzine which helped his symptoms; prev thrush treated w/ Nystatin; he knows to rinse after each treatment.     ?pulm nodule> old films w/ ?56mm nodule right mid zone; subseq films didn't show the lesion; CTChest 9/12 showed 22mm RUL nodule; he is asymptomatic- f/u CXR today shows norm heart size, clear lungs, no nodules seen...    Hx CP/ Palpit> on Diltiazem 30mg  prn for palpit; he was evaluated by [facility] 2/12 for atypCP that occurred after rotator cuff surg by DrAplington (neg Myoview) &  7/14 for palpit=> standard treadmill test was abnormal & he had a hypertensive BP response (but good exercise capacity noted); he returned for Myoview> abn study w/ inferior ischemia- he reports "no cause found" for the palpit (they resolved off caffeine)...    Dyspepsia> prev used Prilosec OTC as needed; he denies recent reflux symptoms, abd pain, etc...    Divertics, Polyp> he had routine screening colonoscopy 5/15 by DrPerry; mod divertics, one diminutive polyp (hyperplastic) & he rec f/u colon in 10 yrs...    GU>  He was seen 11/13 by DWarden> c/o penile pain & felt to have ?IC ?prostatitis; he reports that PSA was wnl, told to avoid kool-aid & bladder irritants; given Mobic15/d & Robaxin500Bid...    LBP> Followed by DrAplington for Ortho- he had right shoulder arthroscopy 4/08; and then resection distal clavicle head & part of acromion on 12/11; he had another MRI of his Lumbar spine from Ortho (we do not have results);  He had right tennis elbow surg 9/13 by DrAplington & improved...    Anxiety> we discussed Rx w/ Hydroxyzine Pamoate 82m Tid prn (he used this prev for his VCD w/ improvement)... We reviewed prob list, meds, xrays and labs> see below for updates >>   CXR 3/16 showed norm heart size, clear lungs, NAD...   LABS 3/16:  FLP- all parameters WNL on diet alone; Chems- wnl;  CBC- wnl;  TSH=1.47...           Problem  List:  ALLERGIC RHINITIS (ICD-477.9) - he uses OTC Zyrtek as needed... ~  allergy testing in 1997 by DrSharma showed 4+ reactions to trees, grass, mold, dust, etc... ~  CT Sinuses 9/06 was neg/ WNL... ~  11/12: [facility]/ [facility]... ~  3/16: presents w/ recurrent sinus infection/ bronchitis; Rx w/ ZPak, Depo80, Pred Dosepak...  ASTHMA (ICD-493.90) & Hx of VOCAL CORD DISORDER (ICD-478.5) - on ADVAIR 250Bid now & PROAIR as needed... ~  baseline CXR shows clear lungs, NAD... ~  prev PFT's reveal mod airflow obstruction... ~  9/12:  He presented w/ dyspnea- hard for him to describe ==>  PFT 9/12 showed FVC=4.12 (99%), FEV1=2.58 (77%), %1sec=63, mid-flows=36%pred; Advair incr to 250Bid.  CXR 9/12 ==> clear, NAD, s/p surg on distal right clavicle...  CTChest 9/12 ==> neg, clear lungs, x 3.994mRUL nodule noted, no adenopathy, & they rec f/u CT in 1y60yr recheck this nodule... ~  1/14: on Advair100Bid; he denies cough, sput, SOB, etc; hx mod airflow obstruction in past; also has hx VCD w/ prev treatment w/ Hydroxyzine which helped his symptoms; his dentist sent him to OralSurg, DrRehm- "white patches" were candida & resolved w/ Nystatin; reminded to rinse well after inhaler use... ~  3/15: on Advair250Bid & ProairHFA prn; he denies cough, sput, SOB, etc; hx mod airflow obstruction in past; also has hx VCD w/ prev treatment w/ Hydroxyzine which helped his symptoms... ~  3/16: on Advair100Bid, ProairHFA prn; he reports a good yr til recent sinus infection/ bronchitis; treated w/ ZPak, Depo80, Pred Dosepak...  ? of PULMONARY NODULE (ICD-518.89) - old films w/ ? 9mm91mdule right mid lung zone superimposed on post aspect of 7th rib... no change serially... ~  CXR 9/10 showed stable 9mm 40m nodule- no change. ~  CXR 8/11 showed clear lungs, no mention of the prev nodule, NAD... ~ Marland KitchenCXR & CTChest 9/12 ==> see above> CXR neg (no nodule seen) & CT w/  clear lungs x 3.96mm RUL nodule noted, no adenopathy, & they rec f/u CT in 65yr to recheck this nodule... ~  1/14:  CXR 1/14 showed norm heart size, clear lungs, no nodules seen, no change in distal clavicles... ~  3/15:  CXR 3/15 showed norm heart size, clear lungs, NAD... ~  3/16:  CXR 3/16 showed norm heart size, clear lungs, NAD...  Hx of CHEST PAIN, ATYPICAL (ICD-786.59) & Hx of PALPITATIONS (ICD-785.1) - he is followed by [facility]- prev DrMcQueen & then DrBerry, & now DrHilty... ? MVP by Echo in 2000... neg Cardiolite 2002... CC is palpit that are worse w/ caffeine, choc, etc... ~  baseline EKG shows NSR, WNL.Marland Kitchen. ~  full eval by Cards 2008 w/ Holter, 2DEcho, Myoview- all normal... ~  Myoview 3/08 was normal- no ischemia, no infarction, EF= 67%... ~  8/11: denies CP, palpit, SOB, etc... exerc at [facility] & doing satis. ~  2/12: he developed some CWP after rotator cuff surg by DrAplington & saw DrHilty for Cards eval; he had an abn standard bruce protocol treadmill test & was supposed to have a Myoview done after that> ?if done, ?results... ~  Plain old treadmill test7/14 showed 12 min exercise, reached target HR, no CP or SOB reported, 1-80mm ST depression inferiorly- felt to be moderate risk & felt they should proceed w/ Myoview... ~  Myoview 7/14 showed exerc on treadmill for 46min, stopped for fatigue/ no CP; 42mm STTW depression, ?mild inferior wall ischemia & norm wall motion- DrCroitoru called it a low risk study & asked to f/u to discuss poss cath...  ~  7/14: he had OV f/u w/ DrHilty> note reviewed, this OV was after his Myoview but results not mentions in note; palpit resolved off caffeine- told to ret PRN, pt states that he was told everything was OK... ~  He has Cardizem 30mg  for prn use but hasn't needed as long as he avoids caffeine etc...  Hx of DYSPEPSIA (ICD-536.8) - uses OMEPRAZOLE 20mg  as needed... prev evals by DrPerry... ~  last EGD 11/03 was WNL...  DIVERTICULOSIS & COLON POLYP >>  ~   5/15 he had routine screening colonoscopy 5/15 by DrPerry; mod divertics, one diminutive polyp (hyperplastic) & he rec f/u colon in 10 yrs...  GU > Followed by DrTannenbaum for hx of pain in penis, left testis, & left groin area; noted to have Low-T (declined Rx) & ED (Staxyn prn)... ~  1/15: he had f/u DrTannenbaum> ED, Low-T, hx prostatitis, hx pain in penis & groin; PSA= 1.21; he doesn't want meds...   Right Shoulder Pain > Ortho eval by DrAplington w/ 2 prev surg: he had right shoulder arthroscopy 4/08; and more recently resection distal clavicle head & part of acromion on 12/11...  BACK PAIN, LUMBAR (ICD-724.2) - eval 7/09 from DrAplington w/ MRI L/S spine showing L2 S1 disc bulging without compression... he still c/o discomfort in the muscles in his left leg... he will f/u w/ Ortho & Neuro for this...  ANXIETY (ICD-300.00) - we have rec that he take HYDROXYZINE (Vistaril) 25mg  Tid but it made him too sleepy & he decr to 1 Qhs...  Family Hx of OTH SYMPTOMS INVLV NERV&MUSCULOSKELETAL SYSTEMS (ICD-781.99) - he has a brohter w/ ?myotonia, and a grandparent w/ hx of myasthenia gravis... eval by DrWillis in 2007 was neg- pt was concerned about some fatigue... ~  EMG/ NCV 4/07 by DrWillis was totally normal w/o neuropathy or muscle disorder...   Past Surgical History  Procedure Laterality  Date  . Shoulder arthroscopy  01/2007    DrAplington--  RIGHT SHOULDER  . Resection of distal right clavicle & acromion  09/2010    DrAplington  . Open resection distal right clavicle and ac joint arthritis  09-23-2010    DR Shellia Carwin  . Umbilical hernia repair  CHILD  . Repair extensor tendon  07/07/2012    Procedure: REPAIR EXTENSOR TENDON;  Surgeon: Magnus Sinning, MD;  Location: [facility];  Service: Orthopedics;  Laterality: Right;  REPAIR OF COMMON EXTENSOR TENDON WITH PARTIAL LATERAL EPICONDYLECTOMY OF THE RIGHT ELBOW     Outpatient Encounter Prescriptions as of 01/02/2015   Medication Sig  . ADVAIR DISKUS 100-50 MCG/DOSE AEPB INHALE 1 PUFF INTO THE LUNGS 2 (TWO) TIMES DAILY.  Marland Kitchen albuterol (PROAIR HFA) 108 (90 BASE) MCG/ACT inhaler Inhale 2 puffs into the lungs every 6 (six) hours as needed.  . diltiazem (CARDIZEM) 30 MG tablet Take 1 tablet by mouth Once daily as needed.    No Known Allergies   Current Medications, Allergies, Past Medical History, Past Surgical History, Family History, and Social History were reviewed in [employer] record.    Review of Systems       The patient denies fever, chills, sweats, anorexia, fatigue, weakness, malaise, weight loss, sleep disorder, blurring, diplopia, eye irritation, eye discharge, vision loss, eye pain, photophobia, earache, ear discharge, tinnitus, decreased hearing, nasal congestion, nosebleeds, sore throat, hoarseness, chest pain, palpitations, syncope, dyspnea on exertion, orthopnea, PND, peripheral edema, cough, dyspnea at rest, excessive sputum, hemoptysis, wheezing, pleurisy, nausea, vomiting, diarrhea, constipation, change in bowel habits, abdominal pain, melena, hematochezia, jaundice, gas/bloating, indigestion/heartburn, dysphagia, odynophagia, dysuria, hematuria, urinary frequency, urinary hesitancy, nocturia, incontinence, back pain, joint pain, joint swelling, muscle cramps, muscle weakness, stiffness, arthritis, sciatica, restless legs, leg pain at night, leg pain with exertion, rash, itching, dryness, suspicious lesions, paralysis, paresthesias, seizures, tremors, vertigo, transient blindness, frequent falls, frequent headaches, difficulty walking, depression, anxiety, memory loss, confusion, cold intolerance, heat intolerance, polydipsia, polyphagia, polyuria, unusual weight change, abnormal bruising, bleeding, enlarged lymph nodes, urticaria, allergic rash, hay fever, and recurrent infections.     Objective:   Physical Exam    WD, WN, 54 y/o BM in NAD... GENERAL:  Alert & oriented;  pleasant & cooperative... HEENT:  [place]/AT, EOM-wnl, PERRLA, Fundi-benign, EACs-clear, TMs-wnl, NOSE-clear, THROAT-clear & wnl. NECK:  Supple w/ full ROM; no JVD; normal carotid impulses w/o bruits; no thyromegaly or nodules palpated; no lymphadenopathy. CHEST:  Clear to P & A; without wheezes/ rales/ or rhonchi heard... HEART:  Regular Rhythm; without murmurs/ rubs/ or gallops detected... ABDOMEN:  Soft & nontender; normal bowel sounds; no organomegaly or masses palpated... RECTAL:  Neg - prostate 3+ & nontender w/o nodules; stool hematest neg. EXT: without deformities or arthritic changes; no varicose veins/ venous insuffic/ or edema... some swelling & tender in IP joint of right thumb. NEURO:  CN's intact; motor testing normal; sensory testing normal; gait normal & balance OK. DERM:  No lesions noted; no rash etc...   Assessment & Plan:    Sinusitis>  He presents w/ sinus symptoms x 1-2d plus mild bronchitis; we decided to treat w/ ZPak, Depo80, Pred dosepak...  AR & Asthma>  Prev PFT confirmed mod airflow obstruction; now on ADVAIR 100Bid regularly; also has hx VCD, & his unusual dyspnea seems better on HYDROXYZINE PAMOATE 59m as needed...     Pulm nodule> old films w/ ?911mnodule right mid zone; subseq films didn't show the lesion; CT  Chest 9/12 w/ 16m RUL nodule & f/u 129yrs suggested=> serial CXRs have been neg...   Hx CP/ Palpit> [facility] that occurred after shoulder surg by DrAplington; standard treadmill test was abnormal & he had a hypertensive BP response (but good exercise capacity noted); he was to return for a Myoview scan> [facility]... Repeat eval 7/14 by DrHilty for Palpit which resolved off caffeine- Treadmill=> Myoview were abnormal (SEE REPORTS), pt indicates that all was neg & told to ret prn...     Dyspepsia> prev used Prilosec OTC as needed; he denies recent reflux symptoms, abd pain, etc...  Divertics/ Polyp>  routine colonoscopy 5/15 showed mod divertics and one diminutive polyp= hyperplastic & f/u suggested 10 yrs...     GU>  He was seen 11/11 by DrTannenbaum for hx of pain in penis, left testis, & left groin area; noted to have Low-T (declined Rx) & ED (Staxyn prn)...     LBP> Followed by DrAplington for Ortho- he had right shoulder arthroscopy 4/08; and more recently resection distal clavicle head & part of acromion on 12/11; he tells me that he just had another MRI of his Lumbar spine & results are pending; now he is concerned regarding some asymmetry of his prox clavicular heads- he thinks the left is more prominent; my exam shows this area to appear wnl in my opinion; the CT Chest should allay his anxiety...     Anxiety> he does not endorse stress or anxiety as a cause of his unusual dyspnea; we discussed Rx w/ Hydroxyzine Pamoate 2531mid (he used this prev for his VCD w/ improvement).   Patient's Medications  New Prescriptions   AZITHROMYCIN (ZITHROMAX) 250 MG TABLET    Take as directed   PREDNISONE (STERAPRED UNI-PAK) 5 MG TABS TABLET    Take as directed  Previous Medications   ADVAIR DISKUS 100-50 MCG/DOSE AEPB    INHALE 1 PUFF INTO THE LUNGS 2 (TWO) TIMES DAILY.   DILTIAZEM (CARDIZEM) 30 MG TABLET    Take 1 tablet by mouth Once daily as needed.  Modified Medications   Modified Medication Previous Medication   ALBUTEROL (PROAIR HFA) 108 (90 BASE) MCG/ACT INHALER albuterol (PROAIR HFA) 108 (90 BASE) MCG/ACT inhaler      Inhale 2 puffs into the lungs every 6 (six) hours as needed.    Inhale 2 puffs into the lungs every 6 (six) hours as needed.  Discontinued Medications   No medications on file

## 2015-01-02 NOTE — Patient Instructions (Signed)
Today we updated your med list in our EPIC system...    Continue your current medications the same...  For your upper resp infection>>    We are prescribing ZITHROMAX (ZPak)- take as directed for the infection...    And we wrote for a Pred Dosepak for the inflammation (take as directed in pack til gone)...  Continue the ADVAIR twice daily & use your rescue inhaler (Proair) as needed...  Today we did your follow up CXR... Please return to our lab one morning soon for your FASTING blood work...    We will contact you w/ the results when available...   Let's get back into an exercise program...  Call for any questions...  Let's plan a follow up visit in 1yr, sooner if needed for problems.Marland KitchenMarland Kitchen

## 2015-01-06 ENCOUNTER — Other Ambulatory Visit (INDEPENDENT_AMBULATORY_CARE_PROVIDER_SITE_OTHER): Payer: 59

## 2015-01-06 DIAGNOSIS — Z0000 Encounter for general adult medical examination without abnormal findings: Secondary | ICD-10-CM

## 2015-01-06 LAB — CBC WITH DIFFERENTIAL/PLATELET
Basophils Absolute: 0 10*3/uL (ref 0.0–0.1)
Basophils Relative: 0.1 % (ref 0.0–3.0)
Eosinophils Absolute: 0 10*3/uL (ref 0.0–0.7)
Eosinophils Relative: 0.1 % (ref 0.0–5.0)
HCT: 43.1 % (ref 39.0–52.0)
Hemoglobin: 14.8 g/dL (ref 13.0–17.0)
Lymphocytes Relative: 12.4 % (ref 12.0–46.0)
Lymphs Abs: 1.1 10*3/uL (ref 0.7–4.0)
MCHC: 34.3 g/dL (ref 30.0–36.0)
MCV: 93.4 fl (ref 78.0–100.0)
Monocytes Absolute: 0.7 10*3/uL (ref 0.1–1.0)
Monocytes Relative: 7.6 % (ref 3.0–12.0)
Neutro Abs: 7.2 10*3/uL (ref 1.4–7.7)
Neutrophils Relative %: 79.8 % — ABNORMAL HIGH (ref 43.0–77.0)
Platelets: 232 10*3/uL (ref 150.0–400.0)
RBC: 4.61 Mil/uL (ref 4.22–5.81)
RDW: 13.7 % (ref 11.5–15.5)
WBC: 9.1 10*3/uL (ref 4.0–10.5)

## 2015-01-06 LAB — BASIC METABOLIC PANEL
BUN: 17 mg/dL (ref 6–23)
CO2: 31 mEq/L (ref 19–32)
Calcium: 9.2 mg/dL (ref 8.4–10.5)
Chloride: 104 mEq/L (ref 96–112)
Creatinine, Ser: 1.1 mg/dL (ref 0.40–1.50)
GFR: 90.32 mL/min (ref >60.00)
Glucose, Bld: 101 mg/dL — ABNORMAL HIGH (ref 70–99)
Potassium: 4.1 mEq/L (ref 3.5–5.1)
Sodium: 137 mEq/L (ref 135–145)

## 2015-01-06 LAB — HEPATIC FUNCTION PANEL
ALT: 36 U/L (ref 0–53)
AST: 20 U/L (ref 0–37)
Albumin: 4.2 g/dL (ref 3.5–5.2)
Alkaline Phosphatase: 78 U/L (ref 39–117)
Bilirubin, Direct: 0.2 mg/dL (ref 0.0–0.3)
Total Bilirubin: 0.8 mg/dL (ref 0.2–1.2)
Total Protein: 7.4 g/dL (ref 6.0–8.3)

## 2015-01-06 LAB — LIPID PANEL
Cholesterol: 161 mg/dL (ref 0–200)
HDL: 67.1 mg/dL (ref >39.00)
LDL Cholesterol: 84 mg/dL (ref 0–99)
NonHDL: 93.9
Total CHOL/HDL Ratio: 2
Triglycerides: 48 mg/dL (ref 0.0–149.0)
VLDL: 9.6 mg/dL (ref 0.0–40.0)

## 2015-01-06 LAB — TSH: TSH: 1.47 u[IU]/mL (ref 0.35–4.50)

## 2015-01-08 NOTE — Progress Notes (Signed)
Quick Note:  lmtcb for pt. ______ 

## 2015-04-18 ENCOUNTER — Ambulatory Visit (INDEPENDENT_AMBULATORY_CARE_PROVIDER_SITE_OTHER): Payer: 59 | Admitting: Podiatry

## 2015-04-18 ENCOUNTER — Encounter: Payer: Self-pay | Admitting: Podiatry

## 2015-04-18 VITALS — BP 119/72 | HR 66 | Resp 12

## 2015-04-18 DIAGNOSIS — B079 Viral wart, unspecified: Secondary | ICD-10-CM

## 2015-04-18 DIAGNOSIS — B078 Other viral warts: Secondary | ICD-10-CM

## 2015-04-18 DIAGNOSIS — M779 Enthesopathy, unspecified: Secondary | ICD-10-CM | POA: Diagnosis not present

## 2015-04-18 NOTE — Progress Notes (Signed)
   Subjective:    Patient ID: Jerry Horton, male    DOB: 06/16/1961, 54 y.o.   MRN: [id number]  HPI PT STATED BOTTOM BALL OF THE FOOT HAVE DISCOLORATION AND PAINFUL FOR 1 WEEK. THE FOOT IS GETTING WORSE WHEN WALKING OR STANDING. TRIED OTC WART REMOVAL BUT NO HELP.   Review of Systems  Skin: Positive for color change.  All other systems reviewed and are negative.      Objective:   Physical Exam        Assessment & Plan:

## 2015-04-19 NOTE — Progress Notes (Signed)
Subjective:     Patient ID: Jerry Horton, male   DOB: Jan 21, 1961, 54 y.o.   MRN: [id number]  HPI patient presents with lesion plantar aspect left foot that he's tried over-the-counter medicine on without relief and it's becoming increasingly painful   Review of Systems  All other systems reviewed and are negative.      Objective:   Physical Exam  Constitutional: He is oriented to person, place, and time.  Cardiovascular: Intact distal pulses.   Musculoskeletal: Normal range of motion.  Neurological: He is oriented to person, place, and time.  Skin: Skin is warm.  Nursing note and vitals reviewed.  neurovascular status intact muscle strength adequate with range of motion subtalar midtarsal joint within normal limits. Patient's noted to have a plantar lesion left distal foot that upon debridement shows pinpoint bleeding and measures about 7 mm x 1 cm. It is painful when palpated from a lateral and medial side     Assessment:     Probable verruca plantaris plantar aspect left foot versus porokeratosis    Plan:     H&P and condition reviewed and today I debrided the lesion fully and then applied chemical to create an immune response and applied sterile dressing. Reappoint to recheck

## 2015-04-22 ENCOUNTER — Ambulatory Visit: Payer: 59 | Admitting: Podiatry

## 2015-05-09 ENCOUNTER — Ambulatory Visit: Payer: 59 | Admitting: *Deleted

## 2015-05-09 DIAGNOSIS — M779 Enthesopathy, unspecified: Secondary | ICD-10-CM

## 2015-05-09 NOTE — Patient Instructions (Signed)

## 2015-05-09 NOTE — Progress Notes (Signed)
Patient ID: Jerry Horton, male   DOB: June 26, 1961, 54 y.o.   MRN: [id number] Patient presents for orthotic pick up.  Verbal and written break in and wear instructions given.  Patient will follow up in 4 weeks if symptoms worsen or fail to improve.

## 2015-08-28 ENCOUNTER — Other Ambulatory Visit: Payer: Self-pay | Admitting: Pulmonary Disease

## 2016-01-01 ENCOUNTER — Telehealth: Payer: Self-pay | Admitting: Pulmonary Disease

## 2016-01-01 ENCOUNTER — Ambulatory Visit (INDEPENDENT_AMBULATORY_CARE_PROVIDER_SITE_OTHER): Payer: Commercial Managed Care - HMO | Admitting: Pulmonary Disease

## 2016-01-01 MED ORDER — ALBUTEROL SULFATE HFA 108 (90 BASE) MCG/ACT IN AERS: 2.0000 | INHALATION_SPRAY | Freq: Four times a day (QID) | RESPIRATORY_TRACT | Status: DC | PRN

## 2016-01-01 MED ORDER — FLUTICASONE-SALMETEROL 100-50 MCG/DOSE IN AEPB: INHALATION_SPRAY | RESPIRATORY_TRACT | Status: DC

## 2016-01-01 NOTE — Telephone Encounter (Signed)
Pt was scheduled to see SN today for yearly follow up but last CPX was on 3.10.16 - insurance requires visit be 1 yr and 1 day after last. Martin Majestic to speak with patient - he is okay with rescheduling appt and reported his breathing is doing well >> scheduled for 4.19.17 Pt requests refills on Advair and ProAir to [facility] [place], 90 day supply >> done 2 samples of Advair given to pt

## 2016-01-02 ENCOUNTER — Ambulatory Visit: Payer: 59 | Admitting: Pulmonary Disease

## 2016-02-11 ENCOUNTER — Ambulatory Visit (INDEPENDENT_AMBULATORY_CARE_PROVIDER_SITE_OTHER): Payer: Commercial Managed Care - HMO | Admitting: Pulmonary Disease

## 2016-02-11 ENCOUNTER — Encounter: Payer: Self-pay | Admitting: Pulmonary Disease

## 2016-02-11 VITALS — BP 142/78 | HR 80 | Temp 97.7°F | Ht 69.5 in | Wt 182.6 lb

## 2016-02-11 DIAGNOSIS — Z0000 Encounter for general adult medical examination without abnormal findings: Secondary | ICD-10-CM

## 2016-02-11 DIAGNOSIS — J301 Allergic rhinitis due to pollen: Secondary | ICD-10-CM

## 2016-02-11 DIAGNOSIS — M545 Low back pain, unspecified: Secondary | ICD-10-CM

## 2016-02-11 DIAGNOSIS — J4520 Mild intermittent asthma, uncomplicated: Secondary | ICD-10-CM

## 2016-02-11 DIAGNOSIS — F411 Generalized anxiety disorder: Secondary | ICD-10-CM

## 2016-02-11 NOTE — Patient Instructions (Signed)
Today we updated your med list in our EPIC system...    Continue your current medications the same...  Let us know when you need med refills...  Please return to our lab one morning soon for your FASTING blood work...    We will contact you w/ the results when available...   Call for any questions...  Let's plan a follow up visit in 50yr, sooner if needed for problems.Marland KitchenMarland Kitchen

## 2016-02-12 ENCOUNTER — Other Ambulatory Visit (INDEPENDENT_AMBULATORY_CARE_PROVIDER_SITE_OTHER): Payer: Commercial Managed Care - HMO

## 2016-02-12 ENCOUNTER — Encounter: Payer: Self-pay | Admitting: Pulmonary Disease

## 2016-02-12 DIAGNOSIS — F411 Generalized anxiety disorder: Secondary | ICD-10-CM | POA: Diagnosis not present

## 2016-02-12 DIAGNOSIS — J4520 Mild intermittent asthma, uncomplicated: Secondary | ICD-10-CM | POA: Diagnosis not present

## 2016-02-12 DIAGNOSIS — Z0000 Encounter for general adult medical examination without abnormal findings: Secondary | ICD-10-CM | POA: Diagnosis not present

## 2016-02-12 LAB — LIPID PANEL
Cholesterol: 194 mg/dL (ref 0–200)
HDL: 62.8 mg/dL (ref >39.00)
LDL Cholesterol: 118 mg/dL — ABNORMAL HIGH (ref 0–99)
NonHDL: 131.5
Total CHOL/HDL Ratio: 3
Triglycerides: 69 mg/dL (ref 0.0–149.0)
VLDL: 13.8 mg/dL (ref 0.0–40.0)

## 2016-02-12 LAB — COMPREHENSIVE METABOLIC PANEL
ALT: 36 U/L (ref 0–53)
AST: 28 U/L (ref 0–37)
Albumin: 4.1 g/dL (ref 3.5–5.2)
Alkaline Phosphatase: 56 U/L (ref 39–117)
BUN: 17 mg/dL (ref 6–23)
CO2: 29 mEq/L (ref 19–32)
Calcium: 9.1 mg/dL (ref 8.4–10.5)
Chloride: 105 mEq/L (ref 96–112)
Creatinine, Ser: 1.13 mg/dL (ref 0.40–1.50)
GFR: 87.18 mL/min (ref >60.00)
Glucose, Bld: 100 mg/dL — ABNORMAL HIGH (ref 70–99)
Potassium: 4.2 mEq/L (ref 3.5–5.1)
Sodium: 139 mEq/L (ref 135–145)
Total Bilirubin: 0.9 mg/dL (ref 0.2–1.2)
Total Protein: 7.1 g/dL (ref 6.0–8.3)

## 2016-02-12 LAB — CBC WITH DIFFERENTIAL/PLATELET
Basophils Absolute: 0 10*3/uL (ref 0.0–0.1)
Basophils Relative: 0.7 % (ref 0.0–3.0)
Eosinophils Absolute: 0.1 10*3/uL (ref 0.0–0.7)
Eosinophils Relative: 2.4 % (ref 0.0–5.0)
HCT: 41.5 % (ref 39.0–52.0)
Hemoglobin: 14.1 g/dL (ref 13.0–17.0)
Lymphocytes Relative: 31.8 % (ref 12.0–46.0)
Lymphs Abs: 1.5 10*3/uL (ref 0.7–4.0)
MCHC: 34 g/dL (ref 30.0–36.0)
MCV: 93.9 fl (ref 78.0–100.0)
Monocytes Absolute: 0.6 10*3/uL (ref 0.1–1.0)
Monocytes Relative: 11.8 % (ref 3.0–12.0)
Neutro Abs: 2.5 10*3/uL (ref 1.4–7.7)
Neutrophils Relative %: 53.3 % (ref 43.0–77.0)
Platelets: 211 10*3/uL (ref 150.0–400.0)
RBC: 4.42 Mil/uL (ref 4.22–5.81)
RDW: 13.7 % (ref 11.5–15.5)
WBC: 4.8 10*3/uL (ref 4.0–10.5)

## 2016-02-12 LAB — TSH: TSH: 1.43 u[IU]/mL (ref 0.35–4.50)

## 2016-02-12 NOTE — Progress Notes (Signed)
Subjective:    Patient ID: Jerry Horton, male    DOB: 10/20/1961, 55 y.o.   MRN: [id number]  HPI 55 y/o BM here for a follow up visit... he has Asthma & allergies and a hx of VCD... he is also followed by DrBerry for [facility]- prev neg cardiac eval from [facility]... ~  SEE PREV EPIC NOTES FOR OLDER DATA >>    CT Chest 10.2012>  Norm heart size, no adenopathy, clear lungs x 72m nodule in RUL, otherw neg...   CXR 1/14 showed norm heart size, clear lungs, no change in distal clavicles...  LABS 1/14:  FLP- at goals on diet alone;  Chems- wnl;  CBC- wnl;  TSH=0.87   EKG 6/14 showed NSR, rate69, wnl, NAD...  CXR 3/15 showed norm heart size, clear lungs, NAD...  LABS 3/15:  FLP- wnl on diet alone;  Chems- wnl;  CBC- wnl;  TSH=1.90  ~  January 02, 2015:  Yearly ROV & CPX> [name] reports a good yr overall, only c/o recent sinus infection w/ blowing yellow mucus x1d; stopped-up, denies f/c/s, but has sl cough w/ beige phlegm as well, denies CP/ SOB/ etc... We reviewed the following medical problems during today's office visit >>     AR & Asthma> on Advair100Bid & ProairHFA prn; hx mod airflow obstruction in past; also has hx VCD w/ prev treatment w/ Hydroxyzine which helped his symptoms; prev thrush treated w/ Nystatin; he knows to rinse after each treatment.     ?pulm nodule> old films w/ ?962mnodule right mid zone; subseq films didn't show the lesion; CTChest 9/12 showed 52m252mUL nodule; he is asymptomatic- f/u CXR today shows norm heart size, clear lungs, no nodules seen...    Hx CP/ Palpit> on Diltiazem '30mg'$  prn for palpit; [facility] that occurred after rotator cuff surg by DrAplington (neg Myoview) & again 7/14 for palpit=> standard treadmill test was abnormal & he had a hypertensive BP response (but good exercise capacity noted); he returned for Myoview> abn study w/ inferior ischemia- he reports "no cause found" for the palpit (they resolved off tea, caffeine); he  continues to do well.    Dyspepsia> prev used Prilosec OTC as needed; he denies recent reflux symptoms, abd pain, etc...    Divertics, Polyp> he had routine screening colonoscopy 5/15 by DrPerry; mod divertics, one diminutive polyp (hyperplastic) & he rec f/u colon in 10 yrs...    GU>  He was seen 11/13 by DWarden> c/o penile pain & felt to have ?IC ?prostatitis; he reports that PSA was wnl, told to avoid kool-aid & bladder irritants; given Mobic15/d & Robaxin500Bid...    LBP> Followed by DrAplington for Ortho- he had right shoulder arthroscopy 4/08; and then resection distal clavicle head & part of acromion on 12/11; he had another MRI of his Lumbar spine from [facility] (we do not have results)- given shots & improved  He had right tennis elbow surg 9/13 by DrAplington & improved...    Anxiety> we discussed Rx w/ Hydroxyzine Pamoate '25mg'$  Tid prn (he used this prev for his VCD w/ improvement)... We reviewed prob list, meds, xrays and labs> see below for updates >>   CXR 3/16 showed norm heart size, clear lungs, NAD...   LABS 3/16:  FLP- all parameters WNL on diet alone; Chems- wnl;  CBC- wnl;  TSH=1.47...  ~  February 11, 2016:  55yr450yr & [name] doing well overall just mentions that his breathing is  sl more labored because he is not exercising like he should- no cough, sput, hemoptysis, CP, tightness, wheezing, or recurrent palpitations...      AR & Asthma> on Advair100Bid & ProairHFA prn; hx mod airflow obstruction in past; also has hx VCD w/ prev treatment w/ Hydroxyzine which helped his symptoms; prev thrush treated w/ Nystatin; he knows to rinse after each treatment.     ?pulm nodule> old films w/ ?9m nodule right mid zone; subseq films didn't show the lesion; CTChest 9/12 showed 442mRUL nodule; he is asymptomatic- f/u CXR today shows norm heart size, clear lungs, no nodules seen...    Hx CP/ Palpit> on Diltiazem 303mrn for palpit; [facility] atypCP that occurred after  rotator cuff surg by DrAplington (neg Myoview) & again 7/14 for palpit=> standard treadmill test was abnormal & he had a hypertensive BP response (but good exercise capacity noted); he returned for Myoview> abn study w/ inferior ischemia- he reports "no cause found" for the palpit (they resolved off tea, caffeine); he continues to do well.    Dyspepsia> prev used Prilosec OTC as needed; he denies recent reflux symptoms, abd pain, etc...    Divertics, Polyp> he had routine screening colonoscopy 5/15 by DrPerry; mod divertics, one diminutive polyp (hyperplastic) & he rec f/u colon in 10 yrs...    GU>  He was seen 11/13 by DWarden> c/o penile pain & felt to have ?IC ?prostatitis; he reports that PSA was wnl, told to avoid kool-aid & bladder irritants; given Mobic15/d & Robaxin500Bid; Urology f/u 3/17 by DrTannenbaum (note reviewed)- BPH, hx prostatitis, hx epididymitis, Low-T (226) & ED (w/ norm PSA=1.21)- he did not want testos replacement rx    LBP> Followed by DrAplington for Ortho- he had right shoulder arthroscopy 4/08; and then resection distal clavicle head & part of acromion on 12/11; he had another MRI of his Lumbar spine from [facility] (we do not have results)- given shots & improved  He had right tennis elbow surg 9/13 by DrAplington & improved...    Anxiety> we discussed Rx w/ Hydroxyzine Pamoate 54m45md prn (he used this prev for his VCD w/ improvement)... EXAM shows Afeb, VSS, O2sat=98% on RA;  HEENT- neg, mallampati2;  Chest- clear w/o w/r/r;  Heart- RR w/o m/r/g;  Abd- soft nontender neg;  Ext- neg w/o c/c/e;  Neuro- intact...  FASTING Labs 02/12/16>  FLP- ok on diet w/ LDL=118;  Chems- wnl w/ BS=100;  CBC- wnl;  TSH=1.43 IMP/PLAN>>  [name] stable- he will continue his Advair100Bid, use the AlbutHFA rescue inhaler as needed; same for his Cardizem30 prn palpit & Vicodin prn pain; he will maintain f/u w/ Urology...           Problem List:  ALLERGIC RHINITIS (ICD-477.9) - he uses OTC Zyrtek  as needed... ~  allergy testing in 1997 by DrSharma showed 4+ reactions to trees, grass, mold, dust, etc... ~  CT Sinuses 9/06 was neg/ WNL... ~  11/12: presents w/ sinus symptoms [facility]... ~  3/16: presents w/ recurrent sinus infection/ bronchitis; Rx w/ ZPak, Depo80, Pred Dosepak...  ASTHMA (ICD-493.90) & Hx of VOCAL CORD DISORDER (ICD-478.5) - on ADVAIR 250Bid now & PROAIR as needed... ~  baseline CXR shows clear lungs, NAD... ~  prev PFT's reveal mod airflow obstruction... ~  9/12:  He presented w/ dyspnea- hard for him to describe ==>  PFT 9/12 showed FVC=4.12 (99%), FEV1=2.58 (77%), %1sec=63,  mid-flows=36%pred; Advair incr to 250Bid.  CXR 9/12 ==> clear, NAD, s/p surg on distal right clavicle...  CTChest 9/12 ==> neg, clear lungs, x 3.38m RUL nodule noted, no adenopathy, & they rec f/u CT in 1434yro recheck this nodule... ~  1/14: on Advair100Bid; he denies cough, sput, SOB, etc; hx mod airflow obstruction in past; also has hx VCD w/ prev treatment w/ Hydroxyzine which helped his symptoms; his dentist sent him to OralSurg, DrRehm- "white patches" were candida & resolved w/ Nystatin; reminded to rinse well after inhaler use... ~  3/15: on Advair250Bid & ProairHFA prn; he denies cough, sput, SOB, etc; hx mod airflow obstruction in past; also has hx VCD w/ prev treatment w/ Hydroxyzine which helped his symptoms... ~  3/16: on Advair100Bid, ProairHFA prn; he reports a good yr til recent sinus infection/ bronchitis; treated w/ ZPak, Depo80, Pred Dosepak...  ? of PULMONARY NODULE (ICD-518.89) - old films w/ ? 57m58module right mid lung zone superimposed on post aspect of 7th rib... no change serially... ~  CXR 9/10 showed stable 57mm33mL nodule- no change. ~  CXR 8/11 showed clear lungs, no mention of the prev nodule, NAD... ~Marland Kitchen CXR & CTChest 9/12 ==> see above> CXR neg (no nodule seen) & CT w/ clear lungs x 3.57mm 32m nodule noted, no adenopathy, & they  rec f/u CT in 34yr t534yrcheck this nodule... ~  1/14:  CXR 1/14 showed norm heart size, clear lungs, no nodules seen, no change in distal clavicles... ~  3/15:  CXR 3/15 showed norm heart size, clear lungs, NAD... ~  3/16:  CXR 3/16 showed norm heart size, clear lungs, NAD...  Hx of CHEST PAIN, ATYPICAL (ICD-786.59) & Hx of PALPITATIONS (ICD-785.1) - he is [facility] DrMcQueen & then DrBerry, & now DrHilty... ? MVP by Echo in 2000... neg Cardiolite 2002... CC is palpit that are worse w/ caffeine, choc, etc... ~  baseline EKG shows NSR, WNL... ~  Marland Kitchenull eval by Cards 2008 w/ Holter, 2DEcho, Myoview- all normal... ~  Myoview 3/08 was normal- no ischemia, no infarction, EF= 67%... ~  8/11: denies CP, palpit, SOB, etc... [facility] ~  2/12: he developed some CWP after rotator cuff surg by DrAplington & saw DrHilty for Cards eval; he had an abn standard bruce protocol treadmill test & was supposed to have a Myoview done after that> ?if done, ?results... ~  Plain old treadmill test7/14 showed 12 min exercise, reached target HR, no CP or SOB reported, 1-2mm ST11mpression inferiorly- felt to be moderate risk & felt they should proceed w/ Myoview... ~  Myoview 7/14 showed exerc on treadmill for 57min, s65mped for fatigue/ no CP; 1mm STTW56mpression, ?mild inferior wall ischemia & norm wall motion- DrCroitoru called it a low risk study & asked to f/u to discuss poss cath...  ~  7/14: he had OV f/u w/ DrHilty> note reviewed, this OV was after his Myoview but results not mentions in note; palpit resolved off caffeine- told to ret PRN, pt states that he was told everything was OK... ~  He has Cardizem 30mg for 57muse but hasn't needed as long as he avoids caffeine etc...  Hx of DYSPEPSIA (ICD-536.8) - uses OMEPRAZOLE 20mg as ne41m... prev evals by DrPerry... ~  last EGD 11/03 was WNL...  DIVERTICULOSIS & COLON POLYP >>  ~  5/15 he had routine screening colonoscopy 5/15 by DrPerry;  mod divertics, one diminutive polyp (hyperplastic) &  he rec f/u colon in 10 yrs...  GU > Followed by DrTannenbaum for hx of pain in penis, left testis, & left groin area; noted to have Low-T (declined Rx) & ED (Staxyn prn)... ~  1/15: he had f/u DrTannenbaum> ED, Low-T, hx prostatitis, hx pain in penis & groin; PSA= 1.21; he doesn't want meds...   Right Shoulder Pain > Ortho eval by DrAplington w/ 2 prev surg: he had right shoulder arthroscopy 4/08; and more recently resection distal clavicle head & part of acromion on 12/11...  BACK PAIN, LUMBAR (ICD-724.2) - eval 7/09 from DrAplington w/ MRI L/S spine showing L2 S1 disc bulging without compression... he still c/o discomfort in the muscles in his left leg... he will f/u w/ Ortho & Neuro for this...  ANXIETY (ICD-300.00) - we have rec that he take HYDROXYZINE (Vistaril) '25mg'$  Tid but it made him too sleepy & he decr to 1 Qhs...  Family Hx of OTH SYMPTOMS INVLV NERV&MUSCULOSKELETAL SYSTEMS (ICD-781.99) - he has a brohter w/ ?myotonia, and a grandparent w/ hx of myasthenia gravis... eval by DrWillis in 2007 was neg- pt was concerned about some fatigue... ~  EMG/ NCV 4/07 by DrWillis was totally normal w/o neuropathy or muscle disorder...   Past Surgical History  Procedure Laterality Date  . Shoulder arthroscopy  01/2007    DrAplington--  RIGHT SHOULDER  . Resection of distal right clavicle & acromion  09/2010    DrAplington  . Open resection distal right clavicle and ac joint arthritis  09-23-2010    DR Shellia Carwin  . Umbilical hernia repair  CHILD  . Repair extensor tendon  07/07/2012    Procedure: REPAIR EXTENSOR TENDON;  Surgeon: Magnus Sinning, MD;  Location[facility];  Service: Orthopedics;  Laterality: Right;  REPAIR OF COMMON EXTENSOR TENDON WITH PARTIAL LATERAL EPICONDYLECTOMY OF THE RIGHT ELBOW     Outpatient Encounter Prescriptions as of 02/11/2016  Medication Sig  . albuterol (PROAIR HFA) 108 (90 Base) MCG/ACT  inhaler Inhale 2 puffs into the lungs every 6 (six) hours as needed.  . diltiazem (CARDIZEM) 30 MG tablet Take 1 tablet by mouth Once daily as needed.  . Fluticasone-Salmeterol (ADVAIR DISKUS) 100-50 MCG/DOSE AEPB INHALE 1 PUFF INTO THE LUNGS 2 (TWO) TIMES DAILY.  Marland Kitchen HYDROcodone-acetaminophen (NORCO/VICODIN) 5-325 MG per tablet TAKE 1 TABLET BY MOUTH 3 TIMES A DAY AS NEEDED FOR PAIN   No facility-administered encounter medications on file as of 02/11/2016.    No Known Allergies   Current Medications, Allergies, Past Medical History, Past Surgical History, Family History, and Social History were reviewed in [employer] record.    Review of Systems       The patient denies fever, chills, sweats, anorexia, fatigue, weakness, malaise, weight loss, sleep disorder, blurring, diplopia, eye irritation, eye discharge, vision loss, eye pain, photophobia, earache, ear discharge, tinnitus, decreased hearing, nasal congestion, nosebleeds, sore throat, hoarseness, chest pain, palpitations, syncope, dyspnea on exertion, orthopnea, PND, peripheral edema, cough, dyspnea at rest, excessive sputum, hemoptysis, wheezing, pleurisy, nausea, vomiting, diarrhea, constipation, change in bowel habits, abdominal pain, melena, hematochezia, jaundice, gas/bloating, indigestion/heartburn, dysphagia, odynophagia, dysuria, hematuria, urinary frequency, urinary hesitancy, nocturia, incontinence, back pain, joint pain, joint swelling, muscle cramps, muscle weakness, stiffness, arthritis, sciatica, restless legs, leg pain at night, leg pain with exertion, rash, itching, dryness, suspicious lesions, paralysis, paresthesias, seizures, tremors, vertigo, transient blindness, frequent falls, frequent headaches, difficulty walking, depression, anxiety, memory loss, confusion, cold intolerance, heat intolerance, polydipsia, polyphagia, polyuria, unusual weight  change, abnormal bruising, bleeding, enlarged lymph nodes,  urticaria, allergic rash, hay fever, and recurrent infections.     Objective:   Physical Exam    WD, WN, 55 y/o BM in NAD... GENERAL:  Alert & oriented; pleasant & cooperative... HEENT:  [place]/AT, EOM-wnl, PERRLA, Fundi-benign, EACs-clear, TMs-wnl, NOSE-clear, THROAT-clear & wnl. NECK:  Supple w/ full ROM; no JVD; normal carotid impulses w/o bruits; no thyromegaly or nodules palpated; no lymphadenopathy. CHEST:  Clear to P & A; without wheezes/ rales/ or rhonchi heard... HEART:  Regular Rhythm; without murmurs/ rubs/ or gallops detected... ABDOMEN:  Soft & nontender; normal bowel sounds; no organomegaly or masses palpated... RECTAL:  Neg - prostate 3+ & nontender w/o nodules; stool hematest neg. EXT: without deformities or arthritic changes; no varicose veins/ venous insuffic/ or edema... some swelling & tender in IP joint of right thumb. NEURO:  CN's intact; motor testing normal; sensory testing normal; gait normal & balance OK. DERM:  No lesions noted; no rash etc...   Assessment & Plan:    Sinusitis>  He presents w/ sinus symptoms x 1-2d plus mild bronchitis; we decided to treat w/ ZPak, Depo80, Pred dosepak...  AR & Asthma>  Prev PFT confirmed mod airflow obstruction; now on ADVAIR 100Bid regularly; also has hx VCD, & his unusual dyspnea seems better on HYDROXYZINE PAMOATE '25mg'$  as needed...     Pulm nodule> old films w/ ?21m nodule right mid zone; subseq films didn't show the lesion; CT Chest 9/12 w/ 477mRUL nodule & f/u 1y31yr suggested=> serial CXRs have been neg...   Hx CP/ Palpit> [facility] for atypCP that occurred after shoulder surg by DrAplington; standard treadmill test was abnormal & he had a hypertensive BP response (but good exercise capacity noted); he was to return for a Myoview scan> [facility]... Repeat eval 7/14 by DrHilty for Palpit which resolved off caffeine- Treadmill=> Myoview were abnormal (SEE REPORTS), pt indicates  that all was neg & told to ret prn...     Dyspepsia> prev used Prilosec OTC as needed; he denies recent reflux symptoms, abd pain, etc...  Divertics/ Polyp> routine colonoscopy 5/15 showed mod divertics and one diminutive polyp= hyperplastic & f/u suggested 10 yrs...     GU>  He was seen 11/11 by DrTannenbaum for hx of pain in penis, left testis, & left groin area; noted to have Low-T (declined Rx) & ED (Staxyn prn)...     LBP> Followed by DrAplington for Ortho- he had right shoulder arthroscopy 4/08; and more recently resection distal clavicle head & part of acromion on 12/11; he tells me that he just had another MRI of his Lumbar spine & results are pending; now he is concerned regarding some asymmetry of his prox clavicular heads- he thinks the left is more prominent; my exam shows this area to appear wnl in my opinion; the CT Chest should allay his anxiety...     Anxiety> he does not endorse stress or anxiety as a cause of his unusual dyspnea; we discussed Rx w/ Hydroxyzine Pamoate '25mg'$  Tid (he used this prev for his VCD w/ improvement).   Patient's Medications  New Prescriptions   No medications on file  Previous Medications   ALBUTEROL (PROAIR HFA) 108 (90 BASE) MCG/ACT INHALER    Inhale 2 puffs into the lungs every 6 (six) hours as needed.   DILTIAZEM (CARDIZEM) 30 MG TABLET    Take 1 tablet by mouth Once daily as  needed.   FLUTICASONE-SALMETEROL (ADVAIR DISKUS) 100-50 MCG/DOSE AEPB    INHALE 1 PUFF INTO THE LUNGS 2 (TWO) TIMES DAILY.   HYDROCODONE-ACETAMINOPHEN (NORCO/VICODIN) 5-325 MG PER TABLET    TAKE 1 TABLET BY MOUTH 3 TIMES A DAY AS NEEDED FOR PAIN  Modified Medications   No medications on file  Discontinued Medications   No medications on file

## 2016-02-13 ENCOUNTER — Telehealth: Payer: Self-pay | Admitting: Pulmonary Disease

## 2016-02-13 NOTE — Telephone Encounter (Signed)
Notes Recorded by Collier Salina, RN on 02/13/2016 at 11:40 AM lmtcb for pt. Notes Recorded by Noralee Space, MD on 02/13/2016 at 8:50 AM Please notify patient>  Labs look good- FLP is OK on diet alone (low chol, low fat); Chems, CBC, Thyroid are all WNL  Pt is aware of results.

## 2016-02-13 NOTE — Progress Notes (Signed)
Quick Note:  lmtcb for pt. ______ 

## 2016-06-26 ENCOUNTER — Ambulatory Visit (HOSPITAL_COMMUNITY)
Admission: EM | Admit: 2016-06-26 | Discharge: 2016-06-26 | Disposition: A | Discharge status: Home / Self Care | Payer: Commercial Managed Care - HMO | Attending: Family Medicine | Admitting: Family Medicine

## 2016-06-26 ENCOUNTER — Encounter (HOSPITAL_COMMUNITY): Payer: Self-pay | Admitting: *Deleted

## 2016-06-26 ENCOUNTER — Ambulatory Visit (INDEPENDENT_AMBULATORY_CARE_PROVIDER_SITE_OTHER): Payer: Commercial Managed Care - HMO

## 2016-06-26 DIAGNOSIS — J069 Acute upper respiratory infection, unspecified: Secondary | ICD-10-CM | POA: Diagnosis not present

## 2016-06-26 DIAGNOSIS — R059 Cough, unspecified: Secondary | ICD-10-CM

## 2016-06-26 DIAGNOSIS — R05 Cough: Secondary | ICD-10-CM

## 2016-06-26 MED ORDER — IPRATROPIUM BROMIDE 0.06 % NA SOLN: 2.0000 | Freq: Four times a day (QID) | NASAL | 1 refills | Status: DC

## 2016-06-26 MED ORDER — GUAIFENESIN-CODEINE 100-10 MG/5ML PO SYRP: 10.0000 mL | ORAL_SOLUTION | Freq: Four times a day (QID) | ORAL | 0 refills | Status: DC | PRN

## 2016-06-26 NOTE — ED Provider Notes (Signed)
[place]    CSN: [id number] Arrival date & time: 06/26/16  1253  First Provider Contact:  First MD Initiated Contact with Patient 06/26/16 1341        History   Chief Complaint No chief complaint on file.   HPI Jerry Horton is a 55 y.o. male.    Cough  Cough characteristics:  Non-productive, dry and hacking Severity:  Moderate Onset quality:  Gradual Duration:  2 weeks Progression:  Unchanged Chronicity:  New Smoker: no   Context: upper respiratory infection and weather changes   Relieved by:  None tried Worsened by:  Nothing Ineffective treatments:  None tried Associated symptoms: no chest pain, no fever, no rhinorrhea, no shortness of breath, no sinus congestion and no wheezing     Past Medical History:  Diagnosis Date  . Allergic rhinitis, cause unspecified   . Anxiety state, unspecified   . Asthma, moderate   . Epicondylitis, lateral, right CHRONIC  . History of echocardiogram 01/18/2007  . History of nuclear stress test 01/07/2011   negative   . History of nuclear stress test 05/09/2013   mild inferior wall ischemia, ST abnormalities   . MVP (mitral valve prolapse) OCC . PALPITATIONS  . Palpitations OCCASIONAL  . Pulmonary nodule STABLE PER CT    Patient Active Problem List   Diagnosis Date Noted  . Physical exam, annual 01/02/2015  . Neck pain 12/09/2011  . Incidental pulmonary nodule, > 60mm and < 56mm 09/20/2011  . VOCAL CORD DISORDER 06/29/2008  . DYSPEPSIA 06/29/2008  . BACK PAIN, LUMBAR 06/29/2008  . PALPITATIONS 06/29/2008  . CHEST PAIN, ATYPICAL 06/29/2008  . ANXIETY 02/22/2008  . Allergic rhinitis 02/22/2008  . Asthma 02/22/2008    Past Surgical History:  Procedure Laterality Date  . OPEN RESECTION DISTAL RIGHT CLAVICLE AND [facility] JOINT ARTHRITIS  09-23-2010   DR Shellia Carwin  . REPAIR EXTENSOR TENDON  07/07/2012   Procedure: REPAIR EXTENSOR TENDON;  Surgeon: Magnus Sinning, MD;  Location: [place];   Service: Orthopedics;  Laterality: Right;  REPAIR OF COMMON EXTENSOR TENDON WITH PARTIAL LATERAL EPICONDYLECTOMY OF THE RIGHT ELBOW   . RESECTION OF DISTAL RIGHT CLAVICLE & ACROMION  09/2010   DrAplington  . SHOULDER ARTHROSCOPY  01/2007   DrAplington--  RIGHT SHOULDER  . UMBILICAL HERNIA REPAIR  CHILD       Home Medications    Prior to Admission medications   Medication Sig Start Date End Date Taking? Authorizing Provider  albuterol (PROAIR HFA) 108 (90 Base) MCG/ACT inhaler Inhale 2 puffs into the lungs every 6 (six) hours as needed. 01/01/16   Noralee Space, MD  diltiazem (CARDIZEM) 30 MG tablet Take 1 tablet by mouth Once daily as needed. 11/23/11   Historical Provider, MD  Fluticasone-Salmeterol (ADVAIR DISKUS) 100-50 MCG/DOSE AEPB INHALE 1 PUFF INTO THE LUNGS 2 (TWO) TIMES DAILY. 01/01/16   Noralee Space, MD  HYDROcodone-acetaminophen (NORCO/VICODIN) 5-325 MG per tablet TAKE 1 TABLET BY MOUTH 3 TIMES A DAY AS NEEDED FOR PAIN 02/22/15   Historical Provider, MD    Family History Family History  Problem Relation Age of Onset  . Hypertension Mother   . Cancer - Other Father     brain  . Colon cancer Neg Hx     Social History Social History  Substance Use Topics  . Smoking status: Never Smoker  . Smokeless tobacco: Never Used  . Alcohol use No     Comment: Social     Allergies  Review of patient's allergies indicates no known allergies.   Review of Systems Review of Systems  Constitutional: Negative.  Negative for fever.  HENT: Negative.  Negative for rhinorrhea.   Respiratory: Positive for cough. Negative for shortness of breath and wheezing.   Cardiovascular: Negative for chest pain.  All other systems reviewed and are negative.    Physical Exam Triage Vital Signs ED Triage Vitals [06/26/16 1320]  Enc Vitals Group     BP 119/63     Pulse Rate 62     Resp 12     Temp 97.7 F (36.5 C)     Temp Source Oral     SpO2 100 %     Weight      Height      Head  Circumference      Peak Flow      Pain Score      Pain Loc      Pain Edu?      Excl. in [place]?    No data found.   Updated Vital Signs BP 119/63 (BP Location: Left Arm)   Pulse 62   Temp 97.7 F (36.5 C) (Oral)   Resp 12   SpO2 100%   Visual Acuity Right Eye Distance:   Left Eye Distance:   Bilateral Distance:    Right Eye Near:   Left Eye Near:    Bilateral Near:     Physical Exam  Constitutional: He is oriented to person, place, and time. He appears well-developed and well-nourished.  Eyes: EOM are normal. Pupils are equal, round, and reactive to light.  Neck: Normal range of motion. Neck supple.  Cardiovascular: Normal rate, regular rhythm, normal heart sounds and intact distal pulses.   Pulmonary/Chest: Effort normal and breath sounds normal.  Lymphadenopathy:    He has no cervical adenopathy.  Neurological: He is alert and oriented to person, place, and time.  Skin: Skin is warm and dry.  Nursing note and vitals reviewed.    UC Treatments / Results  Labs (all labs ordered are listed, but only abnormal results are displayed) Labs Reviewed - No data to display  EKG  EKG Interpretation None       Radiology No results found. X-rays reviewed and report per radiologist.  Procedures Procedures (including critical care time)  Medications Ordered in UC Medications - No data to display   Initial Impression / Assessment and Plan / UC Course  I have reviewed the triage vital signs and the nursing notes.  Pertinent labs & imaging results that were available during my care of the patient were reviewed by me and considered in my medical decision making (see chart for details).  Clinical Course      Final Clinical Impressions(s) / UC Diagnoses   Final diagnoses:  None    New Prescriptions New Prescriptions   No medications on file     Billy Fischer, MD 06/26/16 1436

## 2016-06-26 NOTE — ED Triage Notes (Signed)
Pt  Reports   Symptoms    og f   Cough   And  Congested    X   sev   Weeks    Pt  Has  No   No wheezing

## 2016-10-11 ENCOUNTER — Other Ambulatory Visit: Payer: Self-pay | Admitting: Pulmonary Disease

## 2016-11-30 ENCOUNTER — Encounter: Payer: Self-pay | Admitting: Pulmonary Disease

## 2017-02-10 ENCOUNTER — Encounter: Payer: Self-pay | Admitting: Pulmonary Disease

## 2017-02-10 ENCOUNTER — Other Ambulatory Visit (INDEPENDENT_AMBULATORY_CARE_PROVIDER_SITE_OTHER): Payer: Commercial Managed Care - HMO

## 2017-02-10 ENCOUNTER — Ambulatory Visit (INDEPENDENT_AMBULATORY_CARE_PROVIDER_SITE_OTHER): Payer: Commercial Managed Care - HMO | Admitting: Pulmonary Disease

## 2017-02-10 ENCOUNTER — Ambulatory Visit (INDEPENDENT_AMBULATORY_CARE_PROVIDER_SITE_OTHER)
Admission: RE | Admit: 2017-02-10 | Discharge: 2017-02-10 | Disposition: A | Discharge status: Home / Self Care | Payer: Commercial Managed Care - HMO | Source: Ambulatory Visit | Attending: Pulmonary Disease | Admitting: Pulmonary Disease

## 2017-02-10 VITALS — BP 130/72 | HR 72 | Temp 97.9°F | Ht 69.5 in | Wt 181.1 lb

## 2017-02-10 DIAGNOSIS — Z0000 Encounter for general adult medical examination without abnormal findings: Secondary | ICD-10-CM

## 2017-02-10 DIAGNOSIS — J4520 Mild intermittent asthma, uncomplicated: Secondary | ICD-10-CM

## 2017-02-10 DIAGNOSIS — J301 Allergic rhinitis due to pollen: Secondary | ICD-10-CM

## 2017-02-10 DIAGNOSIS — F411 Generalized anxiety disorder: Secondary | ICD-10-CM

## 2017-02-10 DIAGNOSIS — R7989 Other specified abnormal findings of blood chemistry: Secondary | ICD-10-CM

## 2017-02-10 DIAGNOSIS — M545 Low back pain, unspecified: Secondary | ICD-10-CM

## 2017-02-10 DIAGNOSIS — G8929 Other chronic pain: Secondary | ICD-10-CM

## 2017-02-10 LAB — COMPREHENSIVE METABOLIC PANEL
ALT: 33 U/L (ref 0–53)
AST: 27 U/L (ref 0–37)
Albumin: 4 g/dL (ref 3.5–5.2)
Alkaline Phosphatase: 76 U/L (ref 39–117)
BUN: 19 mg/dL (ref 6–23)
CO2: 29 mEq/L (ref 19–32)
Calcium: 9.1 mg/dL (ref 8.4–10.5)
Chloride: 103 mEq/L (ref 96–112)
Creatinine, Ser: 1.2 mg/dL (ref 0.40–1.50)
GFR: 81.04 mL/min (ref >60.00)
Glucose, Bld: 86 mg/dL (ref 70–99)
Potassium: 4 mEq/L (ref 3.5–5.1)
Sodium: 138 mEq/L (ref 135–145)
Total Bilirubin: 0.6 mg/dL (ref 0.2–1.2)
Total Protein: 7.4 g/dL (ref 6.0–8.3)

## 2017-02-10 LAB — CBC WITH DIFFERENTIAL/PLATELET
Basophils Absolute: 0.1 10*3/uL (ref 0.0–0.1)
Basophils Relative: 1.2 % (ref 0.0–3.0)
Eosinophils Absolute: 0.2 10*3/uL (ref 0.0–0.7)
Eosinophils Relative: 4.8 % (ref 0.0–5.0)
HCT: 44.7 % (ref 39.0–52.0)
Hemoglobin: 15.1 g/dL (ref 13.0–17.0)
Lymphocytes Relative: 31.4 % (ref 12.0–46.0)
Lymphs Abs: 1.5 10*3/uL (ref 0.7–4.0)
MCHC: 33.8 g/dL (ref 30.0–36.0)
MCV: 95.1 fl (ref 78.0–100.0)
Monocytes Absolute: 0.7 10*3/uL (ref 0.1–1.0)
Monocytes Relative: 13.5 % — ABNORMAL HIGH (ref 3.0–12.0)
Neutro Abs: 2.4 10*3/uL (ref 1.4–7.7)
Neutrophils Relative %: 49.1 % (ref 43.0–77.0)
Platelets: 222 10*3/uL (ref 150.0–400.0)
RBC: 4.69 Mil/uL (ref 4.22–5.81)
RDW: 13.2 % (ref 11.5–15.5)
WBC: 4.9 10*3/uL (ref 4.0–10.5)

## 2017-02-10 LAB — LDL CHOLESTEROL, DIRECT: Direct LDL: 96 mg/dL

## 2017-02-10 LAB — LIPID PANEL
Cholesterol: 183 mg/dL (ref 0–200)
HDL: 55.2 mg/dL (ref >39.00)
NonHDL: 128.23
Total CHOL/HDL Ratio: 3
Triglycerides: 201 mg/dL — ABNORMAL HIGH (ref 0.0–149.0)
VLDL: 40.2 mg/dL — ABNORMAL HIGH (ref 0.0–40.0)

## 2017-02-10 LAB — TSH: TSH: 3.6 u[IU]/mL (ref 0.35–4.50)

## 2017-02-10 MED ORDER — FLUTICASONE-SALMETEROL 100-50 MCG/DOSE IN AEPB: 1.0000 | INHALATION_SPRAY | Freq: Two times a day (BID) | RESPIRATORY_TRACT | 3 refills | Status: DC

## 2017-02-10 MED ORDER — ALBUTEROL SULFATE HFA 108 (90 BASE) MCG/ACT IN AERS: 2.0000 | INHALATION_SPRAY | Freq: Four times a day (QID) | RESPIRATORY_TRACT | 3 refills | Status: AC | PRN

## 2017-02-10 NOTE — Progress Notes (Signed)
Subjective:    Patient ID: Jerry Horton, male    DOB: 02/28/1961, 56 y.o.   MRN: [id number]  HPI 56 y/o BM here for a follow up visit... he has Asthma & allergies and a hx of VCD... he is also followed by Jerry Horton for [facility]- prev neg cardiac eval from [facility]... ~  SEE PREV EPIC NOTES FOR OLDER DATA >>    CT Chest 10.2012>  Norm heart size, no adenopathy, clear lungs x 53m nodule in RUL, otherw neg...   CXR 1/14 showed norm heart size, clear lungs, no change in distal clavicles...  LABS 1/14:  FLP- at goals on diet alone;  Chems- wnl;  CBC- wnl;  TSH=0.87   EKG 6/14 showed NSR, rate69, wnl, NAD...  CXR 3/15 showed norm heart size, clear lungs, NAD...  LABS 3/15:  FLP- wnl on diet alone;  Chems- wnl;  CBC- wnl;  TSH=1.90  ~  January 02, 2015:  Yearly ROV & CPX> Jerry Horton reports a good yr overall, only c/o recent sinus infection w/ blowing yellow mucus x1d; stopped-up, denies f/c/s, but has sl cough w/ beige phlegm as well, denies CP/ SOB/ etc... We reviewed the following medical problems during today's office visit >>     AR & Asthma> on Advair100Bid & ProairHFA prn; hx mod airflow obstruction in past; also has hx VCD w/ prev treatment w/ Hydroxyzine which helped his symptoms; prev thrush treated w/ Nystatin; he knows to rinse after each treatment.     ?pulm nodule> old films w/ ?972mnodule right mid zone; subseq films didn't show the lesion; CTChest 9/12 showed 42m43mUL nodule; he is asymptomatic- f/u CXR today shows norm heart size, clear lungs, no nodules seen...    Hx CP/ Palpit> on Diltiazem 62m142mn for palpit; he was evaluated by Jerry Horton for atypCP that occurred after rotator cuff surg by Jerry Horton (neg Myoview) & again 7/14 for palpit=> standard treadmill test was abnormal & he had a hypertensive BP response (but good exercise capacity noted); he returned for Myoview> abn study w/ inferior ischemia- he reports "no cause found" for the palpit (they resolved off tea, caffeine); he  continues to do well.    Dyspepsia> prev used Prilosec OTC as needed; he denies recent reflux symptoms, abd pain, etc...    Divertics, Polyp> he had routine screening colonoscopy 5/15 by Jerry Horton; mod divertics, one diminutive polyp (hyperplastic) & he rec f/u colon in 10 yrs...    GU>  He was seen 11/13 by Jerry Horton> c/o penile pain & felt to have ?IC ?prostatitis; he reports that PSA was wnl, told to avoid kool-aid & bladder irritants; given Mobic15/d & Robaxin500Bid...    LBP> Followed by Jerry Horton for Ortho- he had right shoulder arthroscopy 4/08; and then resection distal clavicle head & part of acromion on 12/11; he had another MRI of his Lumbar spine from [facility] (we do not have results)- given shots & improved  He had right tennis elbow surg 9/13 by Jerry Horton & improved...    Anxiety> we discussed Rx w/ Hydroxyzine Pamoate 25mg51m prn (he used this prev for his VCD w/ improvement)... We reviewed prob list, meds, xrays and labs> see below for updates >>   CXR 3/16 showed norm heart size, clear lungs, NAD...   LABS 3/16:  FLP- all parameters WNL on diet alone; Chems- wnl;  CBC- wnl;  TSH=1.47...  ~  February 11, 2016:  82yr R26yr Jerry Horton is doing well overall just mentions that his breathing is  sl more labored because he is not exercising like he should- no cough, sput, hemoptysis, CP, tightness, wheezing, or recurrent palpitations...     AR & Asthma> on Advair100Bid & ProairHFA prn; hx mod airflow obstruction in past; also has hx VCD w/ prev treatment w/ Hydroxyzine which helped his symptoms; prev thrush treated w/ Nystatin; he knows to rinse after each treatment.     ?pulm nodule> old films w/ ?55m nodule right mid zone; subseq films didn't show the lesion; CTChest 9/12 showed 426mRUL nodule; he is asymptomatic- f/u CXR today shows norm heart size, clear lungs, no nodules seen...    Hx CP/ Palpit> on Diltiazem 3040mrn for palpit; he was evaluated by Jerry Horton for atypCP that occurred after  rotator cuff surg by Jerry Horton (neg Myoview) & again 7/14 for palpit=> standard treadmill test was abnormal & he had a hypertensive BP response (but good exercise capacity noted); he returned for Myoview> abn study w/ inferior ischemia- he reports "no cause found" for the palpit (they resolved off tea, caffeine); he continues to do well.    Dyspepsia> prev used Prilosec OTC as needed; he denies recent reflux symptoms, abd pain, etc...    Divertics, Polyp> he had routine screening colonoscopy 5/15 by Jerry Horton; mod divertics, one diminutive polyp (hyperplastic) & he rec f/u colon in 10 yrs...    GU>  He was seen 11/13 by Jerry Horton> c/o penile pain & felt to have ?IC ?prostatitis; he reports that PSA was wnl, told to avoid kool-aid & bladder irritants; given Mobic15/d & Robaxin500Bid; Urology f/u 3/17 by Jerry Horton (note reviewed)- BPH, hx prostatitis, hx epididymitis, Low-T (226) & Jerry Horton (w/ norm PSA=1.21)- he did not want testos replacement rx    LBP> Followed by Jerry Horton for Ortho- he had right shoulder arthroscopy 4/08; and then resection distal clavicle head & part of acromion on 12/11; he had another MRI of his Lumbar spine from [facility] (we do not have results)- given shots & improved  He had right tennis elbow surg 9/13 by Jerry Horton & improved...    Anxiety> we discussed Rx w/ Hydroxyzine Pamoate 83m57md prn (he used this prev for his VCD w/ improvement)... EXAM shows Afeb, VSS, O2sat=98% on RA;  HEENT- neg, mallampati2;  Chest- clear w/o w/r/r;  Heart- RR w/o m/r/g;  Abd- soft nontender neg;  Ext- neg w/o c/c/e;  Neuro- intact...  FASTING Labs 02/12/16>  FLP- ok on diet w/ LDL=118;  Chems- wnl w/ BS=100;  CBC- wnl;  TSH=1.43 IMP/PLAN>>  Jerry Horton is stable- he will continue his Advair100Bid, use the AlbutHFA rescue inhaler as needed; same for his Cardizem30 prn palpit & Vicodin prn pain; he will maintain f/u w/ Urology...   ~  February 10, 2017:  21yr 17yr& CPX>  Jerry Horton reports recent allergy problems,  sinus congestion, cough w/ yellow sput- went to an urgent care 7 given antibiotic & Pred and improved;  Overall he's had a good yr- no prob w/ chr cough/ sput/ SOB/ CP/ palpit/ edema/ etc;  GI stable w/o abd pain, n/v, c/d, blood seen;  GU followed by Urology (?who- we don't have notes), once yearly & they do PSA testing...  We reviewed the following medical problems during today's office visit>      AR & Asthma> on Advair100Bid & ProairHFA prn; hx mod airflow obstruction in past; also has hx VCD w/ prev treatment w/ Hydroxyzine which helped his symptoms; prev thrush treated w/ Nystatin; he knows to rinse after each treatment w/ the  Advair & we reviewed inhaler technique...    ?pulm nodule> old films w/ ?61m nodule right mid zone; subseq films didn't show the lesion; CTChest 9/12 showed 436mRUL nodule; he is asymptomatic- f/u CXR today shows norm heart size, clear lungs, no nodules seen...    Hx CP/ Palpit> on Diltiazem 304mrn for palpit; [facility] that occurred after rotator cuff surg by Jerry Horton (neg Myoview) & again 7/14 for palpit=> standard treadmill test was abnormal & he had a hypertensive BP response (but good exercise capacity noted); he returned for Myoview> abn study w/ inferior ischemia- he reports "no cause found" for the palpit (they resolved off tea, caffeine); he continues to do well.    Dyspepsia> prev used Prilosec OTC as needed; he denies recent reflux symptoms, abd pain, etc...    Divertics, Polyp> he had routine screening colonoscopy 5/15 by Jerry Horton; mod divertics, one diminutive polyp (hyperplastic) & he rec f/u colon in 10 yrs...    GU>  He was seen 11/13 by Jerry Horton> c/o penile pain & felt to have ?IC ?prostatitis; he reports that PSA was wnl, told to avoid kool-aid & bladder irritants; given Mobic15/d & Robaxin500Bid; Urology f/u 3/17 by Jerry Horton (note reviewed)- BPH, hx prostatitis, hx epididymitis, Low-T (226) & Jerry Horton (w/ norm PSA=1.21)- he did  not want testos replacement rx    LBP> Followed by Jerry Horton for Ortho- he had right shoulder arthroscopy 4/08; and then resection distal clavicle head & part of acromion on 12/11; he had another MRI of his Lumbar spine from [facility] (we do not have results)- given shots & improved  He had right tennis elbow surg 9/13 by Jerry Horton & improved...    Anxiety> we discussed Rx w/ Hydroxyzine Pamoate 33m84md prn (he used this prev for his VCD w/ improvement)... EXAM shows Afeb, VSS, O2sat=97% on RA;  HEENT- neg, mallampati2;  Chest- clear w/o w/r/r;  Heart- RR w/o m/r/g;  Abd- soft nontender neg;  Ext- neg w/o c/c/e;  Neuro- intact...  CXR 02/10/17 showed norm heart size, clear lungs, no acute disease...   LABS 02/10/17>  FLP- ok x TG=201;  Chems- wnl w/ K=4.0, Cr=1.20, BS=86, LFTs wnl;  CBC- ok w/ Hg=15.1, WBC=4.9;  TSH=3.60  LAB 02/24/17>  Varicella IgG titer is pos at 452 indicating immunity & antibodies to Varicella/Zoster virus;  HepC Ab testing is NEG...  IMP/PLAN>>  Jerry Horton is doing well clinically- rec to continue current meds, stay active, avoid caffeine, etc...   NOTE:  Pt refuses all vaccinations- thinks he had Tetanus ~10 yrs ago, refuses Flu & pneumonia shots, doesn't think he ever had chicken pox=> check titer...           Problem List:  ALLERGIC RHINITIS (ICD-477.9) - he uses OTC Zyrtek as needed... ~  allergy testing in 1997 by DrSharma showed 4+ reactions to trees, grass, mold, dust, etc... ~  CT Sinuses 9/06 was neg/ WNL... ~  11/12: presents w/ sinus symptoms [facility]... ~  3/16: presents w/ recurrent sinus infection/ bronchitis; Rx w/ ZPak, Depo80, Pred Dosepak...  ASTHMA (ICD-493.90) & Hx of VOCAL CORD DISORDER (ICD-478.5) - on ADVAIR 250Bid now & PROAIR as needed... ~  baseline CXR shows clear lungs, NAD... ~  prev PFT's reveal mod airflow obstruction... ~  9/12:  He presented w/ dyspnea- hard for him to describe ==>  PFT 9/12 showed  FVC=4.12 (99%), FEV1=2.58 (77%), %1sec=63, mid-flows=36%pred; Advair incr to 250Bid.  CXR 9/12 ==> clear, NAD, s/p surg on distal right clavicle...  CTChest 9/12 ==> neg, clear lungs, x 3.83m RUL nodule noted, no adenopathy, & they rec f/u CT in 150yro recheck this nodule... ~  1/14: on Advair100Bid; he denies cough, sput, SOB, etc; hx mod airflow obstruction in past; also has hx VCD w/ prev treatment w/ Hydroxyzine which helped his symptoms; his dentist sent him to OralSurg, DrRehm- "white patches" were candida & resolved w/ Nystatin; reminded to rinse well after inhaler use... ~  3/15: on Advair250Bid & ProairHFA prn; he denies cough, sput, SOB, etc; hx mod airflow obstruction in past; also has hx VCD w/ prev treatment w/ Hydroxyzine which helped his symptoms... ~  3/16: on Advair100Bid, ProairHFA prn; he reports a good yr til recent sinus infection/ bronchitis; treated w/ ZPak, Depo80, Pred Dosepak...  ? of PULMONARY NODULE (ICD-518.89) - old films w/ ? 62m54module right mid lung zone superimposed on post aspect of 7th rib... no change serially... ~  CXR 9/10 showed stable 62mm4mL nodule- no change. ~  CXR 8/11 showed clear lungs, no mention of the prev nodule, NAD... ~Marland Kitchen CXR & CTChest 9/12 ==> see above> CXR neg (no nodule seen) & CT w/ clear lungs x 3.62mm 56m nodule noted, no adenopathy, & they rec f/u CT in 42yr t542yrcheck this nodule... ~  1/14:  CXR 1/14 showed norm heart size, clear lungs, no nodules seen, no change in distal clavicles... ~  3/15:  CXR 3/15 showed norm heart size, clear lungs, NAD... ~  3/16:  CXR 3/16 showed norm heart size, clear lungs, NAD...  Hx of CHEST PAIN, ATYPICAL (ICD-786.59) & Hx of PALPITATIONS (ICD-785.1) - he is [facility] Dr[facility] & then Jerry Horton, & now DrHilty... ? MVP by Echo in 2000... neg Cardiolite 2002... CC is palpit that are worse w/ caffeine, choc, etc... ~  baseline EKG shows NSR, WNL... ~  Marland Kitchenull eval by Cards 2008 w/ Holter, 2DEcho,  Myoview- all normal... ~  Myoview 3/08 was normal- no ischemia, no infarction, EF= 67%... ~  8/11: denies CP, palpit, SOB, etc... [facility] ~  2/12: he developed some CWP after rotator cuff surg by Jerry Horton & saw DrHilty for Cards eval; he had an abn standard bruce protocol treadmill test & was supposed to have a Myoview done after that> ?if done, ?results... ~  Plain old treadmill test7/14 showed 12 min exercise, reached target HR, no CP or SOB reported, 1-2mm ST64mpression inferiorly- felt to be moderate risk & felt they should proceed w/ Myoview... ~  Myoview 7/14 showed exerc on treadmill for 62min, s66mped for fatigue/ no CP; 1mm STTW61mpression, ?mild inferior wall ischemia & norm wall motion- DrCroitoru called it a low risk study & asked to f/u to discuss poss cath...  ~  7/14: he had OV f/u w/ DrHilty> note reviewed, this OV was after his Myoview but results not mentions in note; palpit resolved off caffeine- told to ret PRN, pt states that he was told everything was OK... ~  He has Cardizem 30mg for 162muse but hasn't needed as long as he avoids caffeine etc...  Hx of DYSPEPSIA (ICD-536.8) - uses OMEPRAZOLE 20mg as ne78m... prev evals by Jerry Horton... ~  last EGD 11/03 was WNL...  DIVERTICULOSIS & COLON POLYP >>  ~  5/15 he had routine screening colonoscopy 5/15 by Jerry Horton; mod divertics, one diminutive polyp (hyperplastic) & he rec f/u colon in 10  yrs...  GU > Followed by Jerry Horton for hx of pain in penis, left testis, & left groin area; noted to have Low-T (declined Rx) & Jerry Horton (Staxyn prn)... ~  1/15: he had f/u Jerry Horton> Jerry Horton, Low-T, hx prostatitis, hx pain in penis & groin; PSA= 1.21; he doesn't want meds...   Right Shoulder Pain > Ortho eval by Jerry Horton w/ 2 prev surg: he had right shoulder arthroscopy 4/08; and more recently resection distal clavicle head & part of acromion on 12/11...  BACK PAIN, LUMBAR (ICD-724.2) - eval 7/09 from Jerry Horton w/ MRI L/S  spine showing L2 S1 disc bulging without compression... he still c/o discomfort in the muscles in his left leg... he will f/u w/ Ortho & Neuro for this...  ANXIETY (ICD-300.00) - we have rec that he take HYDROXYZINE (Vistaril) 69m Tid but it made him too sleepy & he decr to 1 Qhs...  Family Hx of OTH SYMPTOMS INVLV NERV&MUSCULOSKELETAL SYSTEMS (ICD-781.99) - he has a brohter w/ ?myotonia, and a grandparent w/ hx of myasthenia gravis... eval by DrWillis in 2007 was neg- pt was concerned about some fatigue... ~  EMG/ NCV 4/07 by DrWillis was totally normal w/o neuropathy or muscle disorder...   Past Surgical History:  Procedure Laterality Date  . [facility]  09-23-2010   DR AShellia Carwin . REPAIR EXTENSOR TENDON  07/07/2012   Procedure: REPAIR EXTENSOR TENDON;  Surgeon: JMagnus Sinning MD;  [facility]  Service: Orthopedics;  Laterality: Right;  REPAIR OF COMMON EXTENSOR TENDON WITH PARTIAL LATERAL EPICONDYLECTOMY OF THE RIGHT ELBOW   . RESECTION OF DISTAL RIGHT CLAVICLE & ACROMION  09/2010   Jerry Horton  . SHOULDER ARTHROSCOPY  01/2007   Jerry Horton--  RIGHT SHOULDER  . UMBILICAL HERNIA REPAIR  CHILD    Outpatient Encounter Prescriptions as of 02/10/2017  Medication Sig  . albuterol (PROAIR HFA) 108 (90 Base) MCG/ACT inhaler Inhale 2 puffs into the lungs every 6 (six) hours as needed.  . diltiazem (CARDIZEM) 30 MG tablet Take 1 tablet by mouth Once daily as needed.  . Fluticasone-Salmeterol (ADVAIR DISKUS) 100-50 MCG/DOSE AEPB Inhale 1 puff into the lungs 2 (two) times daily.  . [DISCONTINUED] ADVAIR DISKUS 100-50 MCG/DOSE AEPB INHALE 1 PUFF INTO THE LUNGS 2 TIMES DAILY.  . [DISCONTINUED] albuterol (PROAIR HFA) 108 (90 Base) MCG/ACT inhaler Inhale 2 puffs into the lungs every 6 (six) hours as needed.  .Marland KitchenguaiFENesin-codeine (ROBITUSSIN AC) 100-10 MG/5ML syrup Take 10 mLs by mouth 4 (four) times daily as needed for  cough. (Patient not taking: Reported on 02/10/2017)  . HYDROcodone-acetaminophen (NORCO/VICODIN) 5-325 MG per tablet TAKE 1 TABLET BY MOUTH 3 TIMES A DAY AS NEEDED FOR PAIN  . ipratropium (ATROVENT) 0.06 % nasal spray Place 2 sprays into both nostrils 4 (four) times daily. (Patient not taking: Reported on 02/10/2017)   No facility-administered encounter medications on file as of 02/10/2017.     No Known Allergies   Current Medications, Allergies, Past Medical History, Past Surgical History, Family History, and Social History were reviewed in [employer]record.    Review of Systems       The patient denies fever, chills, sweats, anorexia, fatigue, weakness, malaise, weight loss, sleep disorder, blurring, diplopia, eye irritation, eye discharge, vision loss, eye pain, photophobia, earache, ear discharge, tinnitus, decreased hearing, nasal congestion, nosebleeds, sore throat, hoarseness, chest pain, palpitations, syncope, dyspnea on exertion, orthopnea, PND, peripheral edema, cough, dyspnea at rest, excessive sputum,  hemoptysis, wheezing, pleurisy, nausea, vomiting, diarrhea, constipation, change in bowel habits, abdominal pain, melena, hematochezia, jaundice, gas/bloating, indigestion/heartburn, dysphagia, odynophagia, dysuria, hematuria, urinary frequency, urinary hesitancy, nocturia, incontinence, back pain, joint pain, joint swelling, muscle cramps, muscle weakness, stiffness, arthritis, sciatica, restless legs, leg pain at night, leg pain with exertion, rash, itching, dryness, suspicious lesions, paralysis, paresthesias, seizures, tremors, vertigo, transient blindness, frequent falls, frequent headaches, difficulty walking, depression, anxiety, memory loss, confusion, cold intolerance, heat intolerance, polydipsia, polyphagia, polyuria, unusual weight change, abnormal bruising, bleeding, enlarged lymph nodes, urticaria, allergic rash, hay fever, and recurrent infections.      Objective:   Physical Exam    WD, WN, 56 y/o BM in NAD... GENERAL:  Alert & oriented; pleasant & cooperative... HEENT:  [place]/AT, EOM-wnl, PERRLA, Fundi-benign, EACs-clear, TMs-wnl, NOSE-clear, THROAT-clear & wnl. NECK:  Supple w/ full ROM; no JVD; normal carotid impulses w/o bruits; no thyromegaly or nodules palpated; no lymphadenopathy. CHEST:  Clear to P & A; without wheezes/ rales/ or rhonchi heard... HEART:  Regular Rhythm; without murmurs/ rubs/ or gallops detected... ABDOMEN:  Soft & nontender; normal bowel sounds; no organomegaly or masses palpated... RECTAL:  Neg - prostate 3+ & nontender w/o nodules; stool hematest neg. EXT: without deformities or arthritic changes; no varicose veins/ venous insuffic/ or edema... some swelling & tender in IP joint of right thumb. NEURO:  CN's intact; motor testing normal; sensory testing normal; gait normal & balance OK. DERM:  No lesions noted; no rash etc...   Assessment & Plan:    Sinusitis>  He presents w/ sinus symptoms x 1-2d plus mild bronchitis; we decided to treat w/ ZPak, Depo80, Pred dosepak...  AR & Asthma>  Prev PFT confirmed mod airflow obstruction; now on ADVAIR 100Bid regularly; also has hx VCD, & his unusual dyspnea seems better on HYDROXYZINE PAMOATE 38m as needed...     Pulm nodule> old films w/ ?911mnodule right mid zone; subseq films didn't show the lesion; CT Chest 9/12 w/ 52m26mUL nodule & f/u 66yr70yrsuggested=> serial CXRs have been neg...   Hx CP/ Palpit> [facility] that occurred after shoulder surg by Jerry Horton; standard treadmill test was abnormal & he had a hypertensive BP response (but good exercise capacity noted); he was to return for a Myoview scan> [facility] Repeat eval 7/14 by DrHilty for Palpit which resolved off caffeine- Treadmill=> Myoview were abnormal (SEE REPORTS), pt indicates that all was neg & told to ret prn...     Dyspepsia> prev used  Prilosec OTC as needed; he denies recent reflux symptoms, abd pain, etc...  Divertics/ Polyp> routine colonoscopy 5/15 showed mod divertics and one diminutive polyp= hyperplastic & f/u suggested 10 yrs...     GU>  He was seen 11/11 by Jerry Horton for hx of pain in penis, left testis, & left groin area; noted to have Low-T (declined Rx) & Jerry Horton (Staxyn prn)...     LBP> Followed by Jerry Horton for Ortho- he had right shoulder arthroscopy 4/08; and more recently resection distal clavicle head & part of acromion on 12/11; he tells me that he just had another MRI of his Lumbar spine & results are pending; now he is concerned regarding some asymmetry of his prox clavicular heads- he thinks the left is more prominent; my exam shows this area to appear wnl in my opinion; the CT Chest should allay his anxiety...     Anxiety> he does not endorse stress or anxiety  as a cause of his unusual dyspnea; we discussed Rx w/ Hydroxyzine Pamoate 91m Tid (he used this prev for his VCD w/ improvement).   Patient's Medications  New Prescriptions   No medications on file  Previous Medications   DILTIAZEM (CARDIZEM) 30 MG TABLET    Take 1 tablet by mouth Once daily as needed.   GUAIFENESIN-CODEINE (ROBITUSSIN AC) 100-10 MG/5ML SYRUP    Take 10 mLs by mouth 4 (four) times daily as needed for cough.   HYDROCODONE-ACETAMINOPHEN (NORCO/VICODIN) 5-325 MG PER TABLET    TAKE 1 TABLET BY MOUTH 3 TIMES A DAY AS NEEDED FOR PAIN   IPRATROPIUM (ATROVENT) 0.06 % NASAL SPRAY    Place 2 sprays into both nostrils 4 (four) times daily.  Modified Medications   Modified Medication Previous Medication   ALBUTEROL (PROAIR HFA) 108 (90 BASE) MCG/ACT INHALER albuterol (PROAIR HFA) 108 (90 Base) MCG/ACT inhaler      Inhale 2 puffs into the lungs every 6 (six) hours as needed.    Inhale 2 puffs into the lungs every 6 (six) hours as needed.   FLUTICASONE-SALMETEROL (ADVAIR DISKUS) 100-50 MCG/DOSE AEPB ADVAIR DISKUS 100-50 MCG/DOSE AEPB       Inhale 1 puff into the lungs 2 (two) times daily.    INHALE 1 PUFF INTO THE LUNGS 2 TIMES DAILY.  Discontinued Medications   No medications on file

## 2017-02-10 NOTE — Patient Instructions (Signed)
Today we updated your med list in our EPIC system...    Continue your current medications the same...  For your allergies- you may use an OTC antihistamine daily (Claritin, Zyrtek, Allegra) and try the nasal cortisone spray (Flonase, Nasacort) as needed...  Today we checked your follow up CXR & FASTING blood work including a HepC test & cheick pox test (varicella)...    We will contact you w/ the results when available...   Keep up the good work w/ diet & exercise...  Call for any questions...  Let's plan a follow up visit in 88yr, sooner if needed for problems.Marland KitchenMarland Kitchen

## 2017-02-21 ENCOUNTER — Telehealth: Payer: Self-pay | Admitting: Pulmonary Disease

## 2017-02-21 DIAGNOSIS — Z0000 Encounter for general adult medical examination without abnormal findings: Secondary | ICD-10-CM

## 2017-02-21 NOTE — Telephone Encounter (Signed)
Called and spoke with pt and he is aware of lab orders that have been placed and he wanted to come in for these.  He is aware that orders are in, he will just come by when he can to have these done. Nothing further is needed.

## 2017-02-23 ENCOUNTER — Other Ambulatory Visit: Payer: Commercial Managed Care - HMO

## 2017-02-23 ENCOUNTER — Other Ambulatory Visit: Payer: Self-pay | Admitting: Pulmonary Disease

## 2017-02-23 DIAGNOSIS — Z0000 Encounter for general adult medical examination without abnormal findings: Secondary | ICD-10-CM

## 2017-02-24 LAB — VARICELLA ZOSTER ANTIBODY, IGG: Varicella IgG: 452.2 Index — ABNORMAL HIGH (ref <135.00)

## 2017-02-24 LAB — HEPATITIS C ANTIBODY: HCV Ab: NEGATIVE

## 2017-02-25 NOTE — Progress Notes (Signed)
lmtcb for pt.  

## 2017-02-28 NOTE — Progress Notes (Signed)
Spoke with patient and informed him of results. Pt did not have any questions and verbalized understanding. Nothing further is needed.

## 2017-03-01 NOTE — Progress Notes (Signed)
Spoke with patient and informed him of results. Pt verbalized understanding and did not have any questions. Nothing further is needed.

## 2017-11-08 ENCOUNTER — Ambulatory Visit (INDEPENDENT_AMBULATORY_CARE_PROVIDER_SITE_OTHER): Payer: 59

## 2017-11-08 ENCOUNTER — Encounter (INDEPENDENT_AMBULATORY_CARE_PROVIDER_SITE_OTHER): Payer: Self-pay | Admitting: Orthopaedic Surgery

## 2017-11-08 ENCOUNTER — Ambulatory Visit (INDEPENDENT_AMBULATORY_CARE_PROVIDER_SITE_OTHER): Payer: 59 | Admitting: Orthopaedic Surgery

## 2017-11-08 DIAGNOSIS — M5442 Lumbago with sciatica, left side: Secondary | ICD-10-CM | POA: Diagnosis not present

## 2017-11-08 DIAGNOSIS — M542 Cervicalgia: Secondary | ICD-10-CM

## 2017-11-08 DIAGNOSIS — G8929 Other chronic pain: Secondary | ICD-10-CM

## 2017-11-08 MED ORDER — METHYLPREDNISOLONE 4 MG PO TABS: ORAL_TABLET | ORAL | 0 refills | Status: DC

## 2017-11-08 MED ORDER — CYCLOBENZAPRINE HCL 10 MG PO TABS: 10.0000 mg | ORAL_TABLET | Freq: Three times a day (TID) | ORAL | 1 refills | Status: DC | PRN

## 2017-11-08 NOTE — Progress Notes (Signed)
Office Visit Note   Patient: Jerry Horton           Date of Birth: [birth year]           MRN: [id number] Visit Date: 11/08/2017              Requested by: No referring provider defined for this encounter. PCP: Patient, No Pcp Per   Assessment & Plan: Visit Diagnoses:  1. Chronic left-sided low back pain with left-sided sciatica   2. Cervicalgia     Plan: We will send him to physical therapy for his neck and back this is to include home exercise program, core strengthening stretching modalities.  Also place him on Medrol Dosepak no NSAIDs while on the Medrol Dosepak and this is discussed with him.  He will stop his Robaxin start Flexeril.  No operating or driving heavy equipment while on the Flexeril.  Continue moist heat to the low back and neck.  He will follow-up with [place] in 1 month check his progress lack of.  Questions were encouraged and answered by myself Dr. Ninfa Linden at length today.  If his condition becomes worse he is to contact our office.  Follow-Up Instructions: Return in about 4 weeks (around 12/06/2017).   Orders:  Orders Placed This Encounter  Procedures  . XR Cervical Spine 2 or 3 views  . XR Lumbar Spine 2-3 Views   Meds ordered this encounter  Medications  . methylPREDNISolone (MEDROL) 4 MG tablet    Sig: Take as directed    Dispense:  21 tablet    Refill:  0  . cyclobenzaprine (FLEXERIL) 10 MG tablet    Sig: Take 1 tablet (10 mg total) by mouth 3 (three) times daily as needed for muscle spasms.    Dispense:  30 tablet    Refill:  1      Procedures: No procedures performed   Clinical Data: No additional findings.   Subjective: Chief Complaint  Patient presents with  . Lower Back - Pain    HPI Jerry Horton is a 57 year old male was seen for the first time for neck and back pain.  He states he has had history of both back and neck pain in the past but that his neck pain is definitely different than what he has had in the past.  The pain in  the neck is been ongoing for a few weeks.  Pain is mostly on the right side of his neck radiates to his shoulder and down into his chest area.  Does note some some right-sided chest pain.  He does have a history of heart palpitations.  No left-sided chest pain.  He has increased neck pain with rotation of the cervical spine with flexion.  Pain at times does radiate down into the triceps area.  He has had no acute injury to the neck.  He has a history of a right shoulder arthroscopy with what sounds like a NSAID.  He is taking Aleve Robaxin and some leftover Norco all of these helps some with his pain.  He is also tried heat and ice.  He has done physical therapy in the past which is helped. Low back pain has been ongoing for some years became worse over the last couple months.  Pain radiates down the left leg laterally down into the left calf and at times into the left great toe.  He describes as a burning sensation.  He has had no acute injury.  His states he  has been told he has degenerative disc disease lumbar spine in the past and this may have been some 17 years ago.  Said no bowel bladder dysfunction.  No acute injury to the low back Review of Systems Denies  shortness of breath, fevers, chills, nausea or vomiting.  Otherwise please see HPI  Objective: Vital Signs: There were no vitals taken for this visit.  Physical Exam  Constitutional: He is oriented to person, place, and time. He appears well-developed and well-nourished. No distress.  HENT:  Head: Normocephalic and atraumatic.  Cardiovascular: Intact distal pulses.  Pulmonary/Chest: Effort normal.  Neurological: He is alert and oriented to person, place, and time.  Skin: Skin is warm and dry. He is not diaphoretic.  Psychiatric: He has a normal mood and affect. His behavior is normal.    Ortho Exam Lumbar spine tenderness in the lower lumbar paraspinous region on the left.  No tenderness over spinal column.  No tenderness on the right.   Lacks approximately 4 inches of being able to touch his toes.  His pain with forward flexion and with extension of the lumbar spine.  Tight hamstrings bilaterally.  Positive straight leg raise bilaterally.  Is able to walk on his heels and tiptoes.  5 out of 5 strengths throughout the lower extremities against resistance.  Good range of motion of both hips.  Nontender over the trochanteric region of both hips.  Dorsal pedal pulses are intact bilaterally.  Good sensation throughout bilateral feet to light touch.  2+ deep tendon reflexes at the knees and 1+ at the ankles equal and symmetric. Cervical spine tenderness on the right the level of C6-C7 paraspinous region.  Has good flexion and extension of his cervical spine.  Positive Spurling's.  Upper extremity strength testing 5 out of 5 throughout including external and internal rotation bilateral shoulders against resistance and empty can test negative.  Deep tendon reflexes at the biceps brachial radialis triceps all 2+ and equal and symmetric.  Sensation intact bilateral hands to light touch.  Full motor bilateral hands.  Radial pulses are 2+ bilaterally and equal and symmetric.  Fluid range of motion bilateral shoulders without pain.  Tenderness medial border of the right scapula.  Tenderness in the right trapezius region with palpation.  Specialty Comments:  No specialty comments available.  Imaging: Xr Cervical Spine 2 Or 3 Views  Result Date: 11/08/2017 Cervical spine AP lateral views: No acute fractures.  Loss of lordotic curvature.  Endplate spurring C3 through C6.  Disc spaces are all well preserved.  No spondylolisthesis.  Xr Lumbar Spine 2-3 Views  Result Date: 11/08/2017 Lumbar spine 2 views shows endplate spurring at L4 and L5.  Slight retrospondylolisthesis at L3-4.  Facet arthritic changes L4-5 and L5-S1.  No acute fractures.  On the AP view both hips are well located and appear well-preserved.  Normal lordotic curvature is  maintained.    PMFS History: Patient Active Problem List   Diagnosis Date Noted  . Physical exam, annual 01/02/2015  . Neck pain 12/09/2011  . Incidental pulmonary nodule, > 100mm and < 24mm 09/20/2011  . VOCAL CORD DISORDER 06/29/2008  . DYSPEPSIA 06/29/2008  . BACK PAIN, LUMBAR 06/29/2008  . PALPITATIONS 06/29/2008  . CHEST PAIN, ATYPICAL 06/29/2008  . Anxiety state 02/22/2008  . Allergic rhinitis 02/22/2008  . Asthma 02/22/2008   Past Medical History:  Diagnosis Date  . Allergic rhinitis, cause unspecified   . Anxiety state, unspecified   . Asthma, moderate   . Epicondylitis,  lateral, right CHRONIC  . History of echocardiogram 01/18/2007  . History of nuclear stress test 01/07/2011   negative   . History of nuclear stress test 05/09/2013   mild inferior wall ischemia, ST abnormalities   . MVP (mitral valve prolapse) OCC . PALPITATIONS  . Palpitations OCCASIONAL  . Pulmonary nodule STABLE PER CT    Family History  Problem Relation Age of Onset  . Hypertension Mother   . Cancer - Other Father        brain  . Colon cancer Neg Hx     Past Surgical History:  Procedure Laterality Date  . OPEN RESECTION DISTAL RIGHT CLAVICLE AND [facility] JOINT ARTHRITIS  09-23-2010   DR Shellia Carwin  . REPAIR EXTENSOR TENDON  07/07/2012   Procedure: REPAIR EXTENSOR TENDON;  Surgeon: Magnus Sinning, MD;  Location: [place];  Service: Orthopedics;  Laterality: Right;  REPAIR OF COMMON EXTENSOR TENDON WITH PARTIAL LATERAL EPICONDYLECTOMY OF THE RIGHT ELBOW   . RESECTION OF DISTAL RIGHT CLAVICLE & ACROMION  09/2010   [facility]  . SHOULDER ARTHROSCOPY  01/2007   [facility]--  RIGHT SHOULDER  . UMBILICAL HERNIA REPAIR  CHILD   Social History   Occupational History  . Occupation: [employer]  Tobacco Use  . Smoking status: Never Smoker  . Smokeless tobacco: Never Used  Substance and Sexual Activity  . Alcohol use: No    Comment: Social  . Drug use: No  . Sexual activity: Not  on file

## 2017-12-12 ENCOUNTER — Encounter (INDEPENDENT_AMBULATORY_CARE_PROVIDER_SITE_OTHER): Payer: Self-pay | Admitting: Orthopaedic Surgery

## 2017-12-12 ENCOUNTER — Ambulatory Visit (INDEPENDENT_AMBULATORY_CARE_PROVIDER_SITE_OTHER): Payer: 59 | Admitting: Orthopaedic Surgery

## 2017-12-12 DIAGNOSIS — M542 Cervicalgia: Secondary | ICD-10-CM

## 2017-12-12 DIAGNOSIS — M5442 Lumbago with sciatica, left side: Secondary | ICD-10-CM

## 2017-12-12 DIAGNOSIS — G8929 Other chronic pain: Secondary | ICD-10-CM

## 2017-12-12 NOTE — Progress Notes (Signed)
The patient is returning for follow-up after having had 3 weeks of physical therapy on his cervical and lumbar spine.  He says he is doing better overall and thought he had may be bad sleeping habits to be contributing to some of his neck pain and radicular symptoms.  Today he is denying any significant pain at all.  He denying any weakness in his upper or lower extremities.  He is denying any numbness and tingling in his upper or lower extremities.  On exam he has excellent strength of the bilateral upper and lower extremities with no radicular components at all.  He seems to be doing well overall.  At this point he will continue with home exercise program and will follow-up as needed.  All questions concerns were answered and addressed.

## 2018-01-02 ENCOUNTER — Encounter: Payer: Self-pay | Admitting: Pulmonary Disease

## 2018-01-25 ENCOUNTER — Encounter (INDEPENDENT_AMBULATORY_CARE_PROVIDER_SITE_OTHER): Payer: Self-pay | Admitting: Orthopaedic Surgery

## 2018-01-25 ENCOUNTER — Ambulatory Visit (INDEPENDENT_AMBULATORY_CARE_PROVIDER_SITE_OTHER): Payer: 59 | Admitting: Orthopaedic Surgery

## 2018-01-25 DIAGNOSIS — M542 Cervicalgia: Secondary | ICD-10-CM

## 2018-01-25 MED ORDER — METHYLPREDNISOLONE 4 MG PO TABS: ORAL_TABLET | ORAL | 0 refills | Status: DC

## 2018-01-25 NOTE — Progress Notes (Signed)
The patient is well-known to me.  He is a 57 year old who comes in today with neck pain and radicular symptoms with numbness and tingling going down his left arm.  He also has some left arm weakness. Seen him for this before and we sent him through physical therapy for cervical spine as well as put him on anti-inflammatories and a steroid taper in the seem to be doing better.  However he is developing the radicular symptoms that are concerning to him.  On exam he has a positive Spurling sign to the left side.  He has weakness in his triceps.  There is numbness and tingling into his forearm.  This is all again on the left side.  At this point given his radicular symptoms and his failure of conservative treatment including rest, ice, heat, anti-inflammatories and physical therapy and MRI is warranted and medically necessary to rule out nerve compression in the cervical spine.  I am going to put him back on a 6-day steroid taper and will see him back once we have an MRI of the cervical spine.  All questions concerns were answered and addressed.

## 2018-01-27 ENCOUNTER — Other Ambulatory Visit (INDEPENDENT_AMBULATORY_CARE_PROVIDER_SITE_OTHER): Payer: Self-pay

## 2018-01-27 DIAGNOSIS — M542 Cervicalgia: Secondary | ICD-10-CM

## 2018-02-08 ENCOUNTER — Ambulatory Visit (INDEPENDENT_AMBULATORY_CARE_PROVIDER_SITE_OTHER): Payer: 59 | Admitting: Orthopaedic Surgery

## 2018-02-13 ENCOUNTER — Ambulatory Visit (INDEPENDENT_AMBULATORY_CARE_PROVIDER_SITE_OTHER): Payer: 59

## 2018-02-13 ENCOUNTER — Ambulatory Visit: Payer: 59 | Admitting: Pulmonary Disease

## 2018-02-13 ENCOUNTER — Encounter: Payer: Self-pay | Admitting: Pulmonary Disease

## 2018-02-13 ENCOUNTER — Telehealth (INDEPENDENT_AMBULATORY_CARE_PROVIDER_SITE_OTHER): Payer: Self-pay

## 2018-02-13 VITALS — BP 122/68 | HR 78 | Temp 98.1°F | Ht 69.5 in | Wt 182.2 lb

## 2018-02-13 DIAGNOSIS — J301 Allergic rhinitis due to pollen: Secondary | ICD-10-CM

## 2018-02-13 DIAGNOSIS — Z0000 Encounter for general adult medical examination without abnormal findings: Secondary | ICD-10-CM | POA: Diagnosis not present

## 2018-02-13 DIAGNOSIS — J4520 Mild intermittent asthma, uncomplicated: Secondary | ICD-10-CM

## 2018-02-13 DIAGNOSIS — M545 Low back pain: Secondary | ICD-10-CM

## 2018-02-13 DIAGNOSIS — M542 Cervicalgia: Secondary | ICD-10-CM

## 2018-02-13 DIAGNOSIS — G8929 Other chronic pain: Secondary | ICD-10-CM

## 2018-02-13 MED ORDER — TETANUS-DIPHTH-ACELL PERTUSSIS 5-2.5-18.5 LF-MCG/0.5 IM SUSP
0.5000 mL | Freq: Once | INTRAMUSCULAR | Status: AC
  Administered 2018-02-13: 0.5 mL via INTRAMUSCULAR

## 2018-02-13 MED ORDER — TETANUS-DIPHTH-ACELL PERTUSSIS 5-2.5-18.5 LF-MCG/0.5 IM SUSP: 0.5000 mL | Freq: Once | INTRAMUSCULAR | Status: DC

## 2018-02-13 NOTE — Progress Notes (Signed)
Subjective:    Patient ID: Jerry Horton, male    DOB: April 21, 1961, 57 y.o.   MRN: [id number]  HPI 57 y/o BM here for a follow up visit... he has Asthma & allergies and a hx of VCD... he is also followed by Jerry Horton for [facility]- prev neg cardiac eval from Jerry Horton... ~  SEE PREV EPIC NOTES FOR OLDER DATA >>    CT Chest 10.2012>  Norm heart size, no adenopathy, clear lungs x 47m nodule in RUL, otherw neg...   CXR 1/14 showed norm heart size, clear lungs, no change in distal clavicles...  LABS 1/14:  FLP- at goals on diet alone;  Chems- wnl;  CBC- wnl;  TSH=0.87   EKG 6/14 showed NSR, rate69, wnl, NAD...  CXR 3/15 showed norm heart size, clear lungs, NAD...  LABS 3/15:  FLP- wnl on diet alone;  Chems- wnl;  CBC- wnl;  TSH=1.90  ~  January 02, 2015:  Yearly ROV & CPX> Jerry Horton reports a good yr overall, only c/o recent sinus infection w/ blowing yellow mucus x1d; stopped-up, denies f/c/s, but has sl cough w/ beige phlegm as well, denies CP/ SOB/ etc... We reviewed the following medical problems during today's office visit >>     AR & Asthma> on Advair100Bid & ProairHFA prn; hx mod airflow obstruction in past; also has hx VCD w/ prev treatment w/ Hydroxyzine which helped his symptoms; prev thrush treated w/ Nystatin; he knows to rinse after each treatment.     ?pulm nodule> old films w/ ?987mnodule right mid zone; subseq films didn't show the lesion; CTChest 9/12 showed 43m23mUL nodule; he is asymptomatic- f/u CXR today shows norm heart size, clear lungs, no nodules seen...    Hx CP/ Palpit> on Diltiazem 76m79mn for palpit; he was evaluated by Jerry Horton for atypCP that occurred after rotator cuff surg by Jerry Horton (neg Myoview) & again 7/14 for palpit=> standard treadmill test was abnormal & he had a hypertensive BP response (but good exercise capacity noted); he returned for Myoview> abn study w/ inferior ischemia- he reports "no cause found" for the palpit (they resolved off tea, caffeine); he  continues to do well.    Dyspepsia> prev used Prilosec OTC as needed; he denies recent reflux symptoms, abd pain, etc...    Divertics, Polyp> he had routine screening colonoscopy 5/15 by Jerry Horton; mod divertics, one diminutive polyp (hyperplastic) & he rec f/u colon in 10 yrs...    GU>  He was seen 11/13 by Jerry Horton> c/o penile pain & felt to have ?IC ?prostatitis; he reports that PSA was wnl, told to avoid kool-aid & bladder irritants; given Mobic15/d & Robaxin500Bid...    LBP> Followed by Jerry Horton- he had right shoulder arthroscopy 4/08; and then resection distal clavicle head & part of acromion on 12/11; he had another MRI of his Lumbar spine from Jerry Horton (we do not have results)- given shots & improved  He had right tennis elbow surg 9/13 by Jerry Horton & improved...    Anxiety> we discussed Rx w/ Hydroxyzine Pamoate 25mg10m prn (he used this prev for his VCD w/ improvement)... We reviewed prob list, meds, xrays and labs> see below for updates >>   CXR 3/16 showed norm heart size, clear lungs, NAD...   LABS 3/16:  FLP- all parameters WNL on diet alone; Chems- wnl;  CBC- wnl;  TSH=1.47...  ~  February 11, 2016:  57yr R87yr Jerry Horton is doing well overall just mentions that his breathing is  sl more labored because he is not exercising like he should- no cough, sput, hemoptysis, CP, tightness, wheezing, or recurrent palpitations...     AR & Asthma> on Advair100Bid & ProairHFA prn; hx mod airflow obstruction in past; also has hx VCD w/ prev treatment w/ Hydroxyzine which helped his symptoms; prev thrush treated w/ Nystatin; he knows to rinse after each treatment.     ?pulm nodule> old films w/ ?56m nodule right mid zone; subseq films didn't show the lesion; CTChest 9/12 showed 422mRUL nodule; he is asymptomatic- f/u CXR today shows norm heart size, clear lungs, no nodules seen...    Hx CP/ Palpit> on Diltiazem 3080mrn for palpit; he was evaluated by Jerry Horton for atypCP that occurred after  rotator cuff surg by Jerry Horton (neg Myoview) & again 7/14 for palpit=> standard treadmill test was abnormal & he had a hypertensive BP response (but good exercise capacity noted); he returned for Myoview> abn study w/ inferior ischemia- he reports "no cause found" for the palpit (they resolved off tea, caffeine); he continues to do well.    Dyspepsia> prev used Prilosec OTC as needed; he denies recent reflux symptoms, abd pain, etc...    Divertics, Polyp> he had routine screening colonoscopy 5/15 by Jerry Horton; mod divertics, one diminutive polyp (hyperplastic) & he rec f/u colon in 10 yrs...    GU>  He was seen 11/13 by Jerry Horton> c/o penile pain & felt to have ?IC ?prostatitis; he reports that PSA was wnl, told to avoid kool-aid & bladder irritants; given Mobic15/d & Robaxin500Bid; Urology f/u 3/17 by Jerry Horton (note reviewed)- BPH, hx prostatitis, hx epididymitis, Low-T (226) & Jerry Horton (w/ norm PSA=1.21)- he did not want testos replacement rx    LBP> Followed by Jerry Horton- he had right shoulder arthroscopy 4/08; and then resection distal clavicle head & part of acromion on 12/11; he had another MRI of his Lumbar spine from Jerry Horton (we do not have results)- given shots & improved  He had right tennis elbow surg 9/13 by Jerry Horton & improved...    Anxiety> we discussed Rx w/ Hydroxyzine Pamoate 47m68md prn (he used this prev for his VCD w/ improvement)... EXAM shows Afeb, VSS, O2sat=98% on RA;  HEENT- neg, mallampati2;  Chest- clear w/o w/r/r;  Heart- RR w/o m/r/g;  Abd- soft nontender neg;  Ext- neg w/o c/c/e;  Neuro- intact...  FASTING Labs 02/12/16>  FLP- ok on diet w/ LDL=118;  Chems- wnl w/ BS=100;  CBC- wnl;  TSH=1.43 IMP/PLAN>>  Jerry Horton is stable- he will continue his Advair100Bid, use the AlbutHFA rescue inhaler as needed; same for his Cardizem30 prn palpit & Vicodin prn pain; he will maintain f/u w/ Urology...  ~  February 10, 2017:  57yr 38yr& CPX>  Jerry Horton reports recent allergy problems, sinus  congestion, cough w/ yellow sput- went to an urgent care 7 given antibiotic & Pred and improved;  Overall he's had a good yr- no prob w/ chr cough/ sput/ SOB/ CP/ palpit/ edema/ etc;  GI stable w/o abd pain, n/v, c/d, blood seen;  GU followed by Urology (?who- we don't have notes), once yearly & they do PSA testing...  We reviewed the following medical problems during today's office visit>      AR & Asthma> on Advair100Bid & ProairHFA prn; hx mod airflow obstruction in past; also has hx VCD w/ prev treatment w/ Hydroxyzine which helped his symptoms; prev thrush treated w/ Nystatin; he knows to rinse after each treatment w/ the Advair &  we reviewed inhaler technique...    ?pulm nodule> old films w/ ?49m nodule right mid zone; subseq films didn't show the lesion; CTChest 9/12 showed 484mRUL nodule; he is asymptomatic- f/u CXR today shows norm heart size, clear lungs, no nodules seen...    Hx CP/ Palpit> on Diltiazem 3052mrn for palpit; [facility] that occurred after rotator cuff surg by Jerry Horton (neg Myoview) & again 7/14 for palpit=> standard treadmill test was abnormal & he had a hypertensive BP response (but good exercise capacity noted); he returned for Myoview> abn study w/ inferior ischemia- he reports "no cause found" for the palpit (they resolved off tea, caffeine); he continues to do well.    Dyspepsia> prev used Prilosec OTC as needed; he denies recent reflux symptoms, abd pain, etc...    Divertics, Polyp> he had routine screening colonoscopy 5/15 by Jerry Horton; mod divertics, one diminutive polyp (hyperplastic) & he rec f/u colon in 10 yrs...    GU>  He was seen 11/13 by Jerry Horton> c/o penile pain & felt to have ?IC ?prostatitis; he reports that PSA was wnl, told to avoid kool-aid & bladder irritants; given Mobic15/d & Robaxin500Bid; Urology f/u 3/17 by Jerry Horton (note reviewed)- BPH, hx prostatitis, hx epididymitis, Low-T (226) & Jerry Horton (w/ norm PSA=1.21)- he did not  want testos replacement rx    LBP> Followed by Jerry Horton- he had right shoulder arthroscopy 4/08; and then resection distal clavicle head & part of acromion on 12/11; he had another MRI of his Lumbar spine from Jerry Horton (we do not have results)- given shots & improved  He had right tennis elbow surg 9/13 by Jerry Horton & improved...    Anxiety> we discussed Rx w/ Hydroxyzine Pamoate 49m68md prn (he used this prev for his VCD w/ improvement)... EXAM shows Afeb, VSS, O2sat=97% on RA;  HEENT- neg, mallampati2;  Chest- clear w/o w/r/r;  Heart- RR w/o m/r/g;  Abd- soft nontender neg;  Ext- neg w/o c/c/e;  Neuro- intact...  CXR 02/10/17 showed norm heart size, clear lungs, no acute disease...   LABS 02/10/17>  FLP- ok x TG=201;  Chems- wnl w/ K=4.0, Cr=1.20, BS=86, LFTs wnl;  CBC- ok w/ Hg=15.1, WBC=4.9;  TSH=3.60  LAB 02/24/17>  Varicella IgG titer is pos at 452 indicating immunity & antibodies to Varicella/Zoster virus;  HepC Ab testing is NEG...  IMP/PLAN>>  Jerry Horton is doing well clinically- rec to continue current meds, stay active, avoid caffeine, etc...   NOTE:  Pt refuses all vaccinations- thinks he had Tetanus ~10 yrs ago, refuses Flu & pneumonia shots, doesn't think he ever had chicken pox=> check titer...   ~  February 13, 2018:  31yr 19yr& CPX>                Problem List:  ALLERGIC RHINITIS (ICD-477.9) - he uses OTC Zyrtek as needed... ~  allergy testing in 1997 by DrSharma showed 4+ reactions to trees, grass, mold, dust, etc... ~  CT Sinuses 9/06 was neg/ WNL... ~  11/12: presents w/ sinus symptoms having been to [facility] requesting ENT referral... ~  3/16: presents w/ recurrent sinus infection/ bronchitis; Rx w/ ZPak, Depo80, Pred Dosepak...  ASTHMA (ICD-493.90) & Hx of VOCAL CORD DISORDER (ICD-478.5) - on ADVAIR 250Bid now & PROAIR as needed... ~  baseline CXR shows clear lungs, NAD... ~  prev PFT's reveal mod airflow obstruction... ~  9/12:  He presented w/ dyspnea- hard  for him to describe ==>  PFT 9/12 showed FVC=4.12 (99%), FEV1=2.58 (77%), %1sec=63, mid-flows=36%pred; Advair incr to 250Bid.  CXR 9/12 ==> clear, NAD, s/p surg on distal right clavicle...  CTChest 9/12 ==> neg, clear lungs, x 3.34m RUL nodule noted, no adenopathy, & they rec f/u CT in 1558yro recheck this nodule... ~  1/14: on Advair100Bid; he denies cough, sput, SOB, etc; hx mod airflow obstruction in past; also has hx VCD w/ prev treatment w/ Hydroxyzine which helped his symptoms; his dentist sent him to OralSurg, DrRehm- "white patches" were candida & resolved w/ Nystatin; reminded to rinse well after inhaler use... ~  3/15: on Advair250Bid & ProairHFA prn; he denies cough, sput, SOB, etc; hx mod airflow obstruction in past; also has hx VCD w/ prev treatment w/ Hydroxyzine which helped his symptoms... ~  3/16: on Advair100Bid, ProairHFA prn; he reports a good yr til recent sinus infection/ bronchitis; treated w/ ZPak, Depo80, Pred Dosepak...  ? of PULMONARY NODULE (ICD-518.89) - old films w/ ? 16m60module right mid lung zone superimposed on post aspect of 7th rib... no change serially... ~  CXR 9/10 showed stable 16mm62mL nodule- no change. ~  CXR 8/11 showed clear lungs, no mention of the prev nodule, NAD... ~Marland Kitchen CXR & CTChest 9/12 ==> see above> CXR neg (no nodule seen) & CT w/ clear lungs x 3.16mm 33m nodule noted, no adenopathy, & they rec f/u CT in 58yr t58yrcheck this nodule... ~  1/14:  CXR 1/14 showed norm heart size, clear lungs, no nodules seen, no change in distal clavicles... ~  3/15:  CXR 3/15 showed norm heart size, clear lungs, NAD... ~  3/16:  CXR 3/16 showed norm heart size, clear lungs, NAD...  Hx of CHEST PAIN, ATYPICAL (ICD-786.59) & Hx of PALPITATIONS (ICD-785.1) - he is [facility] Dr[facility] & then Jerry Horton, & now DrHilty... ? MVP by Echo in 2000... neg Cardiolite 2002... CC is palpit that are worse w/ caffeine, choc, etc... ~  baseline EKG shows NSR, WNL... ~  Marland Kitchenull  eval by Cards 2008 w/ Holter, 2DEcho, Myoview- all normal... ~  Myoview 3/08 was normal- no ischemia, no infarction, EF= 67%... ~  8/11: denies CP, palpit, SOB, etc... [facility] ~  2/12: he developed some CWP after rotator cuff surg by Jerry Horton & saw DrHilty for Cards eval; he had an abn standard bruce protocol treadmill test & was supposed to have a Myoview done after that> ?if done, ?results... ~  Plain old treadmill test7/14 showed 12 min exercise, reached target HR, no CP or SOB reported, 1-2mm ST55mpression inferiorly- felt to be moderate risk & felt they should proceed w/ Myoview... ~  Myoview 7/14 showed exerc on treadmill for 16min, s18mped for fatigue/ no CP; 1mm STTW60mpression, ?mild inferior wall ischemia & norm wall motion- DrCroitoru called it a low risk study & asked to f/u to discuss poss cath...  ~  7/14: he had OV f/u w/ DrHilty> note reviewed, this OV was after his Myoview but results not mentions in note; palpit resolved off caffeine- told to ret PRN, pt states that he was told everything was OK... ~  He has Cardizem 30mg for 35muse but hasn't needed as long as he avoids caffeine etc...  Hx of DYSPEPSIA (ICD-536.8) - uses OMEPRAZOLE 20mg as ne76m... prev evals by Jerry Horton... ~  last EGD 11/03 was WNL...  DIVERTICULOSIS & COLON POLYP >>  ~  5/15 he had routine screening colonoscopy 5/15 by  Jerry Horton; mod divertics, one diminutive polyp (hyperplastic) & he rec f/u colon in 10 yrs...  GU > Followed by Jerry Horton for hx of pain in penis, left testis, & left groin area; noted to have Low-T (declined Rx) & Jerry Horton (Staxyn prn)... ~  1/15: he had f/u Jerry Horton> Jerry Horton, Low-T, hx prostatitis, hx pain in penis & groin; PSA= 1.21; he doesn't want meds...   Right Shoulder Pain > Horton eval by Jerry Horton w/ 2 prev surg: he had right shoulder arthroscopy 4/08; and more recently resection distal clavicle head & part of acromion on 12/11...  BACK PAIN, LUMBAR (ICD-724.2) -  eval 7/09 from Jerry Horton w/ MRI L/S spine showing L2 S1 disc bulging without compression... he still c/o discomfort in the muscles in his left leg... he will f/u w/ Horton & Neuro for this...  ANXIETY (ICD-300.00) - we have rec that he take HYDROXYZINE (Vistaril) 71m Tid but it made him too sleepy & he decr to 1 Qhs...  Family Hx of OTH SYMPTOMS INVLV NERV&MUSCULOSKELETAL SYSTEMS (ICD-781.99) - he has a brohter w/ ?myotonia, and a grandparent w/ hx of myasthenia gravis... eval by DrWillis in 2007 was neg- pt was concerned about some fatigue... ~  EMG/ NCV 4/07 by DrWillis was totally normal w/o neuropathy or muscle disorder...   Past Surgical History:  Procedure Laterality Date  . [facility]  09-[facility]-2010   DR AShellia Carwin . REPAIR EXTENSOR TENDON  07/07/2012   Procedure: REPAIR EXTENSOR TENDON;  Surgeon: JMagnus Sinning MD;  [facility]  Service: Orthopedics;  Laterality: Right;  REPAIR OF COMMON EXTENSOR TENDON WITH PARTIAL LATERAL EPICONDYLECTOMY OF THE RIGHT ELBOW   . RESECTION OF DISTAL RIGHT CLAVICLE & ACROMION  09/2010   Jerry Horton  . SHOULDER ARTHROSCOPY  01/2007   Jerry Horton--  RIGHT SHOULDER  . UMBILICAL HERNIA REPAIR  CHILD    Outpatient Encounter Medications as of 02/13/2018  Medication Sig  . albuterol (PROAIR HFA) 108 (90 Base) MCG/ACT inhaler Inhale 2 puffs into the lungs every 6 (six) hours as needed.  . diltiazem (CARDIZEM) 30 MG tablet Take 1 tablet by mouth Once daily as needed.  . Fluticasone-Salmeterol (ADVAIR DISKUS) 100-50 MCG/DOSE AEPB Inhale 1 puff into the lungs 2 (two) times daily.  .Marland Kitchenibuprofen (ADVIL,MOTRIN) 600 MG tablet ibuprofen 600 mg tablet  . fluticasone (FLONASE) 50 MCG/ACT nasal spray fluticasone 50 mcg/actuation nasal spray,suspension    Allergies  Allergen Reactions  . Other     Immunization History  Administered Date(s) Administered  . Tdap 02/13/2018  He refuses  FLU vaccine & the Pneumonia shots...    Current Medications, Allergies, Past Medical History, Past Surgical History, Family History, and Social History were reviewed in [employer].    Review of Systems       The patient denies fever, chills, sweats, anorexia, fatigue, weakness, malaise, weight loss, sleep disorder, blurring, diplopia, eye irritation, eye discharge, vision loss, eye pain, photophobia, earache, ear discharge, tinnitus, decreased hearing, nasal congestion, nosebleeds, sore throat, hoarseness, chest pain, palpitations, syncope, dyspnea on exertion, orthopnea, PND, peripheral edema, cough, dyspnea at rest, excessive sputum, hemoptysis, wheezing, pleurisy, nausea, vomiting, diarrhea, constipation, change in bowel habits, abdominal pain, melena, hematochezia, jaundice, gas/bloating, indigestion/heartburn, dysphagia, odynophagia, dysuria, hematuria, urinary frequency, urinary hesitancy, nocturia, incontinence, back pain, joint pain, joint swelling, muscle cramps, muscle weakness, stiffness, arthritis, sciatica, restless legs, leg pain at night, leg pain with exertion, rash, itching, dryness, suspicious lesions, paralysis, paresthesias,  seizures, tremors, vertigo, transient blindness, frequent falls, frequent headaches, difficulty walking, depression, anxiety, memory loss, confusion, cold intolerance, heat intolerance, polydipsia, polyphagia, polyuria, unusual weight change, abnormal bruising, bleeding, enlarged lymph nodes, urticaria, allergic rash, hay fever, and recurrent infections.     Objective:   Physical Exam    WD, WN, 57 y/o BM in NAD... GENERAL:  Alert & oriented; pleasant & cooperative... HEENT:  [place]/AT, EOM-wnl, PERRLA, Fundi-benign, EACs-clear, TMs-wnl, NOSE-clear, THROAT-clear & wnl. NECK:  Supple w/ full ROM; no JVD; normal carotid impulses w/o bruits; no thyromegaly or nodules palpated; no lymphadenopathy. CHEST:  Clear to P & A; without  wheezes/ rales/ or rhonchi heard... HEART:  Regular Rhythm; without murmurs/ rubs/ or gallops detected... ABDOMEN:  Soft & nontender; normal bowel sounds; no organomegaly or masses palpated... RECTAL:  Neg - prostate 3+ & nontender w/o nodules; stool hematest neg. EXT: without deformities or arthritic changes; no varicose veins/ venous insuffic/ or edema... some swelling & tender in IP joint of right thumb. NEURO:  CN's intact; motor testing normal; sensory testing normal; gait normal & balance OK. DERM:  No lesions noted; no rash etc...   Assessment & Plan:    Sinusitis>  He presents w/ sinus symptoms x 1-2d plus mild bronchitis; we decided to treat w/ ZPak, Depo80, Pred dosepak...  AR & Asthma>  Prev PFT confirmed mod airflow obstruction; now on ADVAIR 100Bid regularly; also has hx VCD, & his unusual dyspnea seems better on HYDROXYZINE PAMOATE 19m as needed...     Pulm nodule> old films w/ ?916mnodule right mid zone; subseq films didn't show the lesion; CT Chest 9/12 w/ 110m47mUL nodule & f/u 3yr39yrsuggested=> serial CXRs have been neg...   Hx CP/ Palpit> [facility] atypCP that occurred after shoulder surg by Jerry Horton; standard treadmill test was abnormal & he had a hypertensive BP response (but good exercise capacity noted); he was to return for a Myoview scan> [facility]... Repeat eval 7/14 by DrHilty for Palpit which resolved off caffeine- Treadmill=> Myoview were abnormal (SEE REPORTS), pt indicates that all was neg & told to ret prn...     Dyspepsia> prev used Prilosec OTC as needed; he denies recent reflux symptoms, abd pain, etc...  Divertics/ Polyp> routine colonoscopy 5/15 showed mod divertics and one diminutive polyp= hyperplastic & f/u suggested 10 yrs...     GU>  He was seen 11/11 by Jerry Horton for hx of pain in penis, left testis, & left groin area; noted to have Low-T (declined Rx) & Jerry Horton (Staxyn prn)...     LBP>  Followed by Jerry Horton- he had right shoulder arthroscopy 4/08; and more recently resection distal clavicle head & part of acromion on 12/11; he tells me that he just had another MRI of his Lumbar spine & results are pending; now he is concerned regarding some asymmetry of his prox clavicular heads- he thinks the left is more prominent; my exam shows this area to appear wnl in my opinion; the CT Chest should allay his anxiety...     Anxiety> he does not endorse stress or anxiety as a cause of his unusual dyspnea; we discussed Rx w/ Hydroxyzine Pamoate 25mg72m (he used this prev for his VCD w/ improvement).   Patient's Medications  New Prescriptions   No medications on file  Previous Medications   ALBUTEROL (PROAIR HFA) 108 (90 BASE) MCG/ACT INHALER    Inhale 2 puffs into the  lungs every 6 (six) hours as needed.   DILTIAZEM (CARDIZEM) 30 MG TABLET    Take 1 tablet by mouth Once daily as needed.   FLUTICASONE (FLONASE) 50 MCG/ACT NASAL SPRAY    fluticasone 50 mcg/actuation nasal spray,suspension   FLUTICASONE-SALMETEROL (ADVAIR DISKUS) 100-50 MCG/DOSE AEPB    Inhale 1 puff into the lungs 2 (two) times daily.   IBUPROFEN (ADVIL,MOTRIN) 600 MG TABLET    ibuprofen 600 mg tablet  Modified Medications   No medications on file  Discontinued Medications   AMOXICILLIN (AMOXIL) 875 MG TABLET    amoxicillin 875 mg tablet   AZITHROMYCIN (ZITHROMAX) 250 MG TABLET    azithromycin 250 mg tablet   BROMPHENIRAMINE-PSEUDOEPHEDRINE-DM 30-2-10 MG/5ML SYRUP    brompheniramine-pseudoephedrine-DM 2 mg-30 mg-10 mg/5 mL oral syrup   CHLORZOXAZONE (PARAFON) 500 MG TABLET    chlorzoxazone 500 mg tablet   CICLOPIROX (LOPROX) 0.77 % CREAM    ciclopirox 0.77 % topical cream   CYCLOBENZAPRINE (FLEXERIL) 10 MG TABLET    Take 1 tablet (10 mg total) by mouth 3 (three) times daily as needed for muscle spasms.   GUAIFENESIN-CODEINE (ROBITUSSIN AC) 100-10 MG/5ML SYRUP    Take 10 mLs by mouth 4 (four) times daily as  needed for cough.   HYDROCOD POLST-CHLORPHEN POLST (TUSSICAPS) 10-8 MG CP12    TussiCaps 10 mg-8 mg capsule,extended release  TAKE 1 CAPSULE BY MOUTH EVERY 12 HOURS   HYDROCODONE-ACETAMINOPHEN (NORCO/VICODIN) 5-325 MG PER TABLET    TAKE 1 TABLET BY MOUTH 3 TIMES A DAY AS NEEDED FOR PAIN   IPRATROPIUM (ATROVENT) 0.06 % NASAL SPRAY    Place 2 sprays into both nostrils 4 (four) times daily.   KETOROLAC (TORADOL) 10 MG TABLET    ketorolac 10 mg tablet   LEVOFLOXACIN (LEVAQUIN) 250 MG TABLET    levofloxacin 250 mg tablet   MELOXICAM (MOBIC) 15 MG TABLET    meloxicam 15 mg tablet   METAXALONE (SKELAXIN) 800 MG TABLET    metaxalone 800 mg tablet   METHOCARBAMOL (ROBAXIN) 500 MG TABLET    methocarbamol 500 mg tablet   METHYLPREDNISOLONE (MEDROL) 4 MG TABLET    Medrol dose pack. Take as instructed   METHYLPREDNISOLONE ACETATE (DEPO-MEDROL) 80 MG/ML INJECTION    Depo-Medrol 80 mg/mL suspension for injection  Take 1 mL every day by injection route as directed for 1 day.   METHYLPREDNISOLONE SODIUM SUCCINATE (SOLU-MEDROL) 125 MG/2 ML INJECTION    Solu-Medrol (PF) 125 mg/2 mL solution for injection  Take 125 mg by injection route.   NYSTATIN (MYCOSTATIN) 100000 UNIT/ML SUSPENSION    nystatin 100,000 unit/mL oral suspension   OXYCODONE HCL 10 MG TABS    oxycodone 10 mg tablet   OXYCODONE-ACETAMINOPHEN (PERCOCET) 7.5-325 MG TABLET    oxycodone-acetaminophen 7.5 mg-325 mg tablet   PEG 3350 POWDER (MOVIPREP) 100 G SOLR    MoviPrep 100 gram-7.5 gram-2.691 gram oral powder packet   PREDNISOLONE ACETATE (PRED FORTE) 1 % OPHTHALMIC SUSPENSION    prednisolone acetate 1 % eye drops,suspension   PREDNISONE (DELTASONE) 10 MG TABLET    prednisone 10 mg tablet  TAKE 4 TABS FOR 2 DAYS, THEN 3 TABS FOR 2 DAYS, 2 TABS FOR 2 DAYS, THEN 1 TAB FOR 2 DAYS, THEN STOP   PREDNISONE (DELTASONE) 20 MG TABLET    prednisone 20 mg tablet   PREDNISONE (DELTASONE) 5 MG TABLET    prednisone 5 mg tablets in a dose pack    PROMETHAZINE-DEXTROMETHORPHAN (PROMETHAZINE-DM) 6.25-15 MG/5ML SYRUP    promethazine-DM 6.25  mg-15 mg/5 mL oral syrup   PROMETHAZINE-DEXTROMETHORPHAN (PROMETHAZINE-DM) 6.25-15 MG/5ML SYRUP    TAKE 5 ML EVERY 6 HOURS BY ORAL ROUTE FOR 7 DAYS.

## 2018-02-13 NOTE — Patient Instructions (Signed)
Today we updated your med list in our EPIC system...    Continue your current medications the same...  Today we gave you a combination Tetanus shot called the TDaP vaccine (it is good for 10 yrs)...  Please return to our lab one morning this week for your FASTING blood work...    We will contact you w/ the results when available...   Stay as active as possible, doing your physical therapy as instructed by DrBlackman's team...  Call for any questions or if I can be of service in any way.Marland KitchenMarland Kitchen

## 2018-02-13 NOTE — Telephone Encounter (Signed)
Cx appt with Dr Ninfa Linden. No MRI done. Called patient to let himknow no naswer LMOM. He needs appt for in about 2 weeks or so until he has MRI done. [facility] will call him to schedule.

## 2018-02-14 ENCOUNTER — Ambulatory Visit (INDEPENDENT_AMBULATORY_CARE_PROVIDER_SITE_OTHER): Payer: 59 | Admitting: Orthopaedic Surgery

## 2018-02-14 ENCOUNTER — Other Ambulatory Visit (INDEPENDENT_AMBULATORY_CARE_PROVIDER_SITE_OTHER): Payer: 59

## 2018-02-14 DIAGNOSIS — Z0000 Encounter for general adult medical examination without abnormal findings: Secondary | ICD-10-CM | POA: Diagnosis not present

## 2018-02-14 LAB — COMPREHENSIVE METABOLIC PANEL
ALT: 41 U/L (ref 0–53)
AST: 28 U/L (ref 0–37)
Albumin: 3.8 g/dL (ref 3.5–5.2)
Alkaline Phosphatase: 63 U/L (ref 39–117)
BUN: 21 mg/dL (ref 6–23)
CO2: 28 mEq/L (ref 19–32)
Calcium: 8.9 mg/dL (ref 8.4–10.5)
Chloride: 104 mEq/L (ref 96–112)
Creatinine, Ser: 1.21 mg/dL (ref 0.40–1.50)
GFR: 79.97 mL/min (ref >60.00)
Glucose, Bld: 91 mg/dL (ref 70–99)
Potassium: 4 mEq/L (ref 3.5–5.1)
Sodium: 138 mEq/L (ref 135–145)
Total Bilirubin: 0.7 mg/dL (ref 0.2–1.2)
Total Protein: 6.9 g/dL (ref 6.0–8.3)

## 2018-02-14 LAB — LIPID PANEL
Cholesterol: 193 mg/dL (ref 0–200)
HDL: 58.8 mg/dL (ref >39.00)
NonHDL: 134.11
Total CHOL/HDL Ratio: 3
Triglycerides: 246 mg/dL — ABNORMAL HIGH (ref 0.0–149.0)
VLDL: 49.2 mg/dL — ABNORMAL HIGH (ref 0.0–40.0)

## 2018-02-14 LAB — CBC WITH DIFFERENTIAL/PLATELET
Basophils Absolute: 0.1 10*3/uL (ref 0.0–0.1)
Basophils Relative: 1.1 % (ref 0.0–3.0)
Eosinophils Absolute: 0.2 10*3/uL (ref 0.0–0.7)
Eosinophils Relative: 4.8 % (ref 0.0–5.0)
HCT: 44.3 % (ref 39.0–52.0)
Hemoglobin: 15.2 g/dL (ref 13.0–17.0)
Lymphocytes Relative: 29 % (ref 12.0–46.0)
Lymphs Abs: 1.4 10*3/uL (ref 0.7–4.0)
MCHC: 34.3 g/dL (ref 30.0–36.0)
MCV: 94.8 fl (ref 78.0–100.0)
Monocytes Absolute: 0.6 10*3/uL (ref 0.1–1.0)
Monocytes Relative: 11.3 % (ref 3.0–12.0)
Neutro Abs: 2.7 10*3/uL (ref 1.4–7.7)
Neutrophils Relative %: 53.8 % (ref 43.0–77.0)
Platelets: 208 10*3/uL (ref 150.0–400.0)
RBC: 4.67 Mil/uL (ref 4.22–5.81)
RDW: 13.2 % (ref 11.5–15.5)
WBC: 5 10*3/uL (ref 4.0–10.5)

## 2018-02-14 LAB — TSH: TSH: 2.26 u[IU]/mL (ref 0.35–4.50)

## 2018-02-14 LAB — LDL CHOLESTEROL, DIRECT: Direct LDL: 95 mg/dL

## 2018-02-23 ENCOUNTER — Ambulatory Visit
Admission: RE | Admit: 2018-02-23 | Discharge: 2018-02-23 | Disposition: A | Discharge status: Home / Self Care | Payer: 59 | Source: Ambulatory Visit | Attending: Orthopaedic Surgery | Admitting: Orthopaedic Surgery

## 2018-02-23 DIAGNOSIS — M542 Cervicalgia: Secondary | ICD-10-CM

## 2018-02-27 ENCOUNTER — Encounter (INDEPENDENT_AMBULATORY_CARE_PROVIDER_SITE_OTHER): Payer: Self-pay | Admitting: Orthopaedic Surgery

## 2018-02-27 ENCOUNTER — Ambulatory Visit (INDEPENDENT_AMBULATORY_CARE_PROVIDER_SITE_OTHER): Payer: 59 | Admitting: Orthopaedic Surgery

## 2018-02-27 DIAGNOSIS — M542 Cervicalgia: Secondary | ICD-10-CM

## 2018-02-27 NOTE — Progress Notes (Signed)
The patient is following up after having an MRI of the cervical spine.  He was having significant neck pain with radicular symptoms going down his left arm.  He does have a home traction device and the disease that he has been to physical therapy and been on anti-inflammatories.  Right now he says he has no issues.  This clinic comes up and flares up on him from time to time.  We did send him for an MRI though due to significant radicular symptoms of the time going on his left arm.  On exam today he is got excellent range of motion of his neck.  He does have a positive Spurling sign the left side but is got excellent strength in his bilateral upper extremities and normal sensation all dermatomes.   At this point since he is doing so well when I have him continue home exercise program as well as his own traction and therapy.  However his MRI findings are reviewed with him and it does show significant multifactorial foraminal stenosis to the left side at C5-C6 and C6-C7.  This is due to the disc osteophyte complex at these levels.  There is some flattening of the spinal cord at this level as well.  I showed him the findings and went over his MRI report.  If his symptoms persist I would like to send him for neurosurgical evaluation from a spine surgeon.  He understands this as well and will let us know.

## 2018-03-02 ENCOUNTER — Other Ambulatory Visit: Payer: Self-pay | Admitting: Pulmonary Disease

## 2018-03-02 ENCOUNTER — Telehealth: Payer: Self-pay | Admitting: Pulmonary Disease

## 2018-03-02 MED ORDER — FLUTICASONE-SALMETEROL 100-50 MCG/DOSE IN AEPB: 1.0000 | INHALATION_SPRAY | Freq: Two times a day (BID) | RESPIRATORY_TRACT | 3 refills | Status: DC

## 2018-03-02 NOTE — Telephone Encounter (Signed)
Spoke with pt and advised rx sent to pharmacy. Nothing further is needed.   

## 2018-03-03 ENCOUNTER — Telehealth: Payer: Self-pay | Admitting: Pulmonary Disease

## 2018-03-03 NOTE — Telephone Encounter (Signed)
Spoke with pt, advised him that I reordered what was on his list from his chart. He states he will talk with the pharmacist and get back with [place] because he does not want generic. I advised him that the pharmacist would have to explain because I am not sure what happened. Pt agreed to call back Monday and advise.

## 2018-03-06 NOTE — Telephone Encounter (Signed)
Pt is calling back [phone]

## 2018-03-06 NOTE — Telephone Encounter (Signed)
Left message for patient to call back to see if he needed anymore assistance.

## 2018-03-06 NOTE — Telephone Encounter (Signed)
lmtcb for pt.  

## 2018-03-07 NOTE — Telephone Encounter (Signed)
Spoke with pt,he states he will call us if he needs to change the RX. He has been working 12 hour days and has not had a chance to check. I will close message.

## 2018-09-12 ENCOUNTER — Other Ambulatory Visit: Payer: Self-pay | Admitting: Otolaryngology

## 2018-09-12 ENCOUNTER — Other Ambulatory Visit (INDEPENDENT_AMBULATORY_CARE_PROVIDER_SITE_OTHER): Payer: Self-pay | Admitting: Otolaryngology

## 2018-09-12 DIAGNOSIS — J329 Chronic sinusitis, unspecified: Secondary | ICD-10-CM

## 2018-09-15 ENCOUNTER — Ambulatory Visit
Admission: RE | Admit: 2018-09-15 | Discharge: 2018-09-15 | Disposition: A | Discharge status: Home / Self Care | Payer: 59 | Source: Ambulatory Visit | Attending: Otolaryngology | Admitting: Otolaryngology

## 2018-09-15 DIAGNOSIS — J329 Chronic sinusitis, unspecified: Secondary | ICD-10-CM

## 2019-02-02 ENCOUNTER — Other Ambulatory Visit: Payer: Self-pay | Admitting: Pulmonary Disease

## 2019-04-03 ENCOUNTER — Telehealth: Payer: Self-pay

## 2019-04-12 ENCOUNTER — Other Ambulatory Visit: Payer: Self-pay

## 2019-04-12 ENCOUNTER — Encounter: Payer: Self-pay | Admitting: Cardiology

## 2019-04-12 ENCOUNTER — Ambulatory Visit: Payer: 59 | Admitting: Cardiology

## 2019-04-12 VITALS — BP 152/92 | HR 71 | Temp 98.4°F | Ht 69.0 in | Wt 174.0 lb

## 2019-04-12 DIAGNOSIS — I10 Essential (primary) hypertension: Secondary | ICD-10-CM | POA: Diagnosis not present

## 2019-04-12 DIAGNOSIS — R002 Palpitations: Secondary | ICD-10-CM

## 2019-04-12 DIAGNOSIS — Z13220 Encounter for screening for lipoid disorders: Secondary | ICD-10-CM

## 2019-04-12 NOTE — Progress Notes (Signed)
Patient referred by Jerry Rosser, PA-C for hypertension  Subjective:   Jerry Horton, male    DOB: 11/10/1960, 58 y.o.   MRN: [id number]   Chief Complaint  Patient presents with  . Hypertension    saw JG last year 04/19/2018    HPI  58 y.o. African American male with hypertension, palpitations.   Patient was previously seen by Dr. Einar Gip in June 2019.  He continues to have similar complaints of short lasting palpitations (1-2 seconds) that are infrequent and do not cause any chest pain or shortness of breath.  He was recently seen by lesion at urgent care and was now referred back for management of hypertension.  He is currently only on diltiazem 120 mg daily and does not want to add any new medications at this time.  He has previously undergone echocardiogram and nuclear stress test through [facility], that showed only mild inferior perfusion defect and was considered to be low risk stress test. He denies chest pain, shortness of breath, leg edema, orthopnea, PND, TIA/syncope.   Past Medical History:  Diagnosis Date  . Allergic rhinitis, cause unspecified   . Anxiety state, unspecified   . Asthma, moderate   . Epicondylitis, lateral, right CHRONIC  . History of echocardiogram 01/18/2007  . History of nuclear stress test 01/07/2011   negative   . History of nuclear stress test 05/09/2013   mild inferior wall ischemia, ST abnormalities   . MVP (mitral valve prolapse) OCC . PALPITATIONS  . Palpitations OCCASIONAL  . Pulmonary nodule STABLE PER CT     Past Surgical History:  Procedure Laterality Date  . OPEN RESECTION DISTAL RIGHT CLAVICLE AND [facility] JOINT ARTHRITIS  09-23-2010   DR Shellia Carwin  . REPAIR EXTENSOR TENDON  07/07/2012   Procedure: REPAIR EXTENSOR TENDON;  Surgeon: Magnus Sinning, MD;  Location: [place];  Service: Orthopedics;  Laterality: Right;  REPAIR OF COMMON EXTENSOR TENDON WITH PARTIAL LATERAL EPICONDYLECTOMY OF THE RIGHT ELBOW   .  RESECTION OF DISTAL RIGHT CLAVICLE & ACROMION  09/2010   [facility]  . SHOULDER ARTHROSCOPY  01/2007   [facility]--  RIGHT SHOULDER  . UMBILICAL HERNIA REPAIR  CHILD     Social History   Socioeconomic History  . Marital status: Married    Spouse name: Jerry Horton  . Number of children: 2  . Years of education: Not on file  . Highest education level: Not on file  Occupational History  . Occupation: [employer]  Social Needs  . Financial resource strain: Not on file  . Food insecurity    Worry: Not on file    Inability: Not on file  . Transportation needs    Medical: Not on file    Non-medical: Not on file  Tobacco Use  . Smoking status: Never Smoker  . Smokeless tobacco: Never Used  Substance and Sexual Activity  . Alcohol use: No    Comment: Social  . Drug use: No  . Sexual activity: Not on file  Lifestyle  . Physical activity    Days per week: Not on file    Minutes per session: Not on file  . Stress: Not on file  Relationships  . Social Herbalist on phone: Not on file    Gets together: Not on file    Attends religious service: Not on file    Active member of club or organization: Not on file    Attends meetings of clubs  or organizations: Not on file    Relationship status: Not on file  . Intimate partner violence    Fear of current or ex partner: Not on file    Emotionally abused: Not on file    Physically abused: Not on file    Forced sexual activity: Not on file  Other Topics Concern  . Not on file  Social History Narrative  . Not on file     Family History  Problem Relation Age of Onset  . Hypertension Mother   . Cancer - Other Father        brain  . Colon cancer Neg Hx      Current Outpatient Medications on File Prior to Visit  Medication Sig Dispense Refill  . ADVAIR DISKUS 100-50 MCG/DOSE AEPB TAKE 1 PUFF BY MOUTH TWICE A DAY 60 each 0  . albuterol (PROAIR HFA) 108 (90 Base) MCG/ACT inhaler Inhale 2 puffs into the lungs every 6  (six) hours as needed. 3 Inhaler 3  . diltiazem (CARDIZEM) 30 MG tablet Take 1 tablet by mouth Once daily as needed.    . fluticasone (FLONASE) 50 MCG/ACT nasal spray fluticasone 50 mcg/actuation nasal spray,suspension    . ibuprofen (ADVIL,MOTRIN) 600 MG tablet ibuprofen 600 mg tablet     No current facility-administered medications on file prior to visit.     Cardiovascular studies:  EKG 04/12/2019: Sinus rhythm 71 bpm. Nonspecific T-abnormality.   Stress test 2014: Overall Impression:  Low risk stress nuclear study with mild inferior wall ischemia. and    LV Wall Motion:  NL LV Function; NL Wall Motion  Recent labs: 03/27/2019:  Glucose 92. BUN/Cr 12/1.18. eGFR 79. Na/K 139/5.3.  Total bilirubin 1.3 (mildly elevated) H/H 15/44. MCV 92. Platelets 220 Chol 189, TG 84, HDL 71, LDL 101.  TSH 2.8.  Review of Systems  Constitution: Negative for decreased appetite, malaise/fatigue, weight gain and weight loss.  HENT: Negative for congestion.   Eyes: Negative for visual disturbance.  Cardiovascular: Positive for palpitations (Infrequent, short lasting). Negative for chest pain, dyspnea on exertion, leg swelling and syncope.  Respiratory: Negative for cough.   Endocrine: Negative for cold intolerance.  Hematologic/Lymphatic: Does not bruise/bleed easily.  Skin: Negative for itching and rash.  Musculoskeletal: Negative for myalgias.  Gastrointestinal: Negative for abdominal pain, nausea and vomiting.  Genitourinary: Negative for dysuria.  Neurological: Negative for dizziness and weakness.  Psychiatric/Behavioral: The patient is not nervous/anxious.   All other systems reviewed and are negative.        Vitals:   04/12/19 1534  BP: (!) 152/92  Pulse: 71  Temp: 98.4 F (36.9 C)  SpO2: 99%     Body mass index is 25.7 kg/m. Filed Weights   04/12/19 1534  Weight: 174 lb (78.9 kg)    Objective:   Physical Exam  Constitutional: He is oriented to person, place, and  time. He appears well-developed and well-nourished. No distress.  HENT:  Head: Normocephalic and atraumatic.  Eyes: Pupils are equal, round, and reactive to light. Conjunctivae are normal.  Neck: No JVD present.  Cardiovascular: Normal rate, regular rhythm and intact distal pulses.  Pulmonary/Chest: Effort normal and breath sounds normal. He has no wheezes. He has no rales.  Abdominal: Soft. Bowel sounds are normal. There is no rebound.  Musculoskeletal:        General: No edema.  Lymphadenopathy:    He has no cervical adenopathy.  Neurological: He is alert and oriented to person, place, and time. No cranial  nerve deficit.  Skin: Skin is warm and dry.  Psychiatric: He has a normal mood and affect.  Nursing note and vitals reviewed.         Assessment & Recommendations:   58 y.o. African American male with hypertension, palpitations.   1. Palpitations Infrequent and short lasting.  Unlikely to be serious arrhythmias.  Normal EF on stress test in 2014.  Physical exam unremarkable.  No further work-up necessary at this time.  2. Essential hypertension Increased diltiazem from 120 mg daily to 240 mg daily.  Continue follow-up with PCP.  3. Screening cholesterol level While he does have mildly abnormal stress test from 2014, he has not had any angina symptoms.  I do not think he needs ischemic work-up at this time.  Recommend screening lipid panel for primary prevention.  I will see him on as-needed basis.   Thank you for referring the patient to [place]. Please feel free to contact with any questions.  [name], MD [facility] Pager: [phone] Office: [phone] If no answer Cell [phone]   Addendum: Lipid panel reviewed.  Favorable.  Statin therapy not indicated at this time.

## 2019-04-14 ENCOUNTER — Encounter: Payer: Self-pay | Admitting: Cardiology

## 2019-04-14 LAB — LIPID PANEL
Chol/HDL Ratio: 2.2 ratio (ref 0.0–5.0)
Cholesterol, Total: 164 mg/dL (ref 100–199)
HDL: 76 mg/dL (ref >39)
LDL Calculated: 78 mg/dL (ref 0–99)
Triglycerides: 50 mg/dL (ref 0–149)
VLDL Cholesterol Cal: 10 mg/dL (ref 5–40)

## 2019-04-16 ENCOUNTER — Encounter: Payer: 59 | Admitting: Internal Medicine

## 2019-04-18 ENCOUNTER — Encounter: Payer: Self-pay | Admitting: Internal Medicine

## 2019-04-18 ENCOUNTER — Ambulatory Visit: Payer: 59 | Admitting: Internal Medicine

## 2019-04-18 ENCOUNTER — Other Ambulatory Visit: Payer: Self-pay

## 2019-04-18 DIAGNOSIS — J4530 Mild persistent asthma, uncomplicated: Secondary | ICD-10-CM

## 2019-04-18 DIAGNOSIS — I10 Essential (primary) hypertension: Secondary | ICD-10-CM | POA: Diagnosis not present

## 2019-04-18 NOTE — Progress Notes (Signed)
Jerry Horton, male    DOB: 1961/03/13,     MRN: [id number]   Brief patient profile:  44 yobm never smoker with asthma all his life maint on advair 100 bid previously followed by Dr Lenna Gilford for pcp seeking to establish with me for pulmonary f/u but does not yet have PCP    History of Present Illness  04/18/2019  Pulmonary/ 1st office eval/[facility]  Chief Complaint  Patient presents with  . Follow-up    Former patient of Dr Lenna Gilford. Breathing is overall doing well and he rarely uses his rescue inhaler.   Dyspnea:  yardwork ok, no aerobics Cough: no Sleep: no problem SABA use: rarely  No obvious day to day or daytime variability or assoc excess/ purulent sputum or mucus plugs or hemoptysis or cp or chest tightness, subjective wheeze or overt sinus or hb symptoms.   sleeping without nocturnal  or early am exacerbation  of respiratory  c/o's or need for noct saba. Also denies any obvious fluctuation of symptoms with weather or environmental changes or other aggravating or alleviating factors except as outlined above   No unusual exposure hx or h/o childhood pna  or knowledge of premature birth.  Current Allergies, Complete Past Medical History, Past Surgical History, Family History, and Social History were reviewed in [employer] [employer] record.  ROS  The following are not active complaints unless bolded Hoarseness, sore throat, dysphagia, dental problems, itching, sneezing,  nasal congestion or discharge of excess mucus or purulent secretions, ear ache,   fever, chills, sweats, unintended wt loss or wt gain, classically pleuritic or exertional cp,  orthopnea pnd or arm/hand swelling  or leg swelling, presyncope, palpitations, abdominal pain, anorexia, nausea, vomiting, diarrhea  or change in bowel habits or change in bladder habits, change in stools or change in urine, dysuria, hematuria,  rash, arthralgias, visual complaints, headache, numbness, weakness or ataxia or problems  with walking or coordination,  change in mood or  memory.               Past Medical History:  Diagnosis Date  . Allergic rhinitis, cause unspecified   . Anxiety state, unspecified   . Asthma, moderate   . Epicondylitis, lateral, right CHRONIC  . History of echocardiogram 01/18/2007  . History of nuclear stress test 01/07/2011   negative   . History of nuclear stress test 05/09/2013   mild inferior wall ischemia, ST abnormalities   . Hypertension   . MVP (mitral valve prolapse) OCC . PALPITATIONS  . Palpitations OCCASIONAL  . Pulmonary nodule STABLE PER CT    Outpatient Medications Prior to Visit  Medication Sig Dispense Refill  . ADVAIR DISKUS 100-50 MCG/DOSE AEPB TAKE 1 PUFF BY MOUTH TWICE A DAY 60 each 0  . albuterol (PROAIR HFA) 108 (90 Base) MCG/ACT inhaler Inhale 2 puffs into the lungs every 6 (six) hours as needed. 3 Inhaler 3  . diltiazem (DILT-XR) 120 MG 24 hr capsule Take 120 mg by mouth daily.    . Turmeric 400 MG CAPS Take by mouth.    . vitamin B-12 (CYANOCOBALAMIN) 500 MCG tablet Take 500 mcg by mouth daily.        Objective:     BP 112/60 (BP Location: Left Arm, Cuff Size: Normal)   Pulse 84   Temp 97.7 F (36.5 C) (Oral)   Ht 5' 9" (1.753 m)   Wt 174 lb (78.9 kg)   SpO2 99%   BMI 25.70 kg/m   SpO2:  99 %  RA  amb bm nad  HEENT: nl   turbinates bilaterally, and oropharynx. Nl external ear canals without cough reflex   NECK :  without JVD/Nodes/TM/ nl carotid upstrokes bilaterally   LUNGS: no acc muscle use,  Nl contour chest which is clear to A and P bilaterally without cough on insp or exp maneuvers   CV:  RRR  no s3 or murmur or increase in P2, and no edema   ABD:  soft and nontender with nl inspiratory excursion in the supine position. No bruits or organomegaly appreciated, bowel sounds nl  MS:  Nl gait/ ext warm without deformities, calf tenderness, cyanosis or clubbing No obvious joint restrictions   SKIN: warm and dry without lesions     NEURO:  alert, approp, nl sensorium with  no motor or cerebellar deficits apparent.      Assessment   Asthma Onset in childhood/ longterm maint on advai 100 one bid - 04/18/2019  After extensive coaching inhaler device,  effectiveness =    75% with hfa (delay in inspiration, needs to use spacer or trigger at insp)  As above, - The proper method of use, as well as anticipated side effects, of a metered-dose inhaler are discussed and demonstrated to the patient. With both dpi and hfa reviewed - if problems with using hfa saba then can change to respiclick so both saba and laba/ics are DPI.   At present, All goals of chronic asthma control met including optimal function and elimination of symptoms with minimal need for rescue therapy.  Contingencies discussed in full including contacting this office immediately if not controlling the symptoms using the rule of two's:  If your breathing worsens or you need to use your rescue inhaler more than twice weekly or wake up more than twice a month with any respiratory symptoms or require more than two rescue inhalers per year, we need to see you right away because this means we're not controlling the underlying problem (inflammation) adequately.  Rescue inhalers (albuterol) do not control inflammation and overuse can lead to unnecessary and costly consequences.  They can make you feel better temporarily but eventually they will quit working effectively much as sleep aids lead to more insomnia if used regularly.        >>> will refer to internal medicine for primary care and can see him here yearly for reefills  if pt or pcp desires or defer refills to pcp but of course always willing to see for acute resp flares.      Total time devoted to counseling  > 50 % of initial 45 min office visit:  reviewed case with pt/  device teaching which extended face to face time for this visit  discussion of options/alternatives/ personally creating written  customized instructions  in presence of pt  then going over those specific  Instructions directly with the pt including how to use all of the meds but in particular covering each new medication in detail and the difference between the maintenance= "automatic" meds and the prns using an action plan format for the latter (If this problem/symptom => do that organization reading Left to right).  Please see AVS from this visit for a full list of these instructions which I personally wrote for this pt and  are unique to this visit.      Christinia Gully, MD 04/18/2019

## 2019-04-18 NOTE — Patient Instructions (Addendum)
Plan A = Automatic = Advair 250 one twice   Plan B = Backup Only use your albuterol (Proair)  inhaler as a rescue medication to be used if you can't catch your breath by resting or doing a relaxed purse lip breathing pattern.  - The less you use it, the better it will work when you need it. - Ok to use the inhaler up to 2 puffs  every 4 hours if you must but call for appointment if use goes up over your usual need - Don't leave home without it !!  (think of it like the spare tire for your car)   Plan C = Crisis - only use your albuterol nebulizer if you first try Plan B and it fails to help > ok to use the nebulizer up to every 4 hours but if start needing it regularly call for immediate appointment   Plan D = Doctor - call me if B and C not adequate  Plan E = ER - go to ER or call 911 if all else fails    If you are satisfied with your treatment plan,  let your doctor know and he/she can either refill your medications or you can return here when your prescription runs out.     If in any way you are not 100% satisfied,  please tell us.  If 100% better, tell your friends!  Pulmonary follow up is as needed

## 2019-04-19 ENCOUNTER — Encounter: Payer: Self-pay | Admitting: Internal Medicine

## 2019-04-19 NOTE — Assessment & Plan Note (Addendum)
Onset in childhood/ longterm maint on advai 100 one bid - 04/18/2019  After extensive coaching inhaler device,  effectiveness =    75% with hfa (delay in inspiration, needs to use spacer or trigger at insp)  As above, - The proper method of use, as well as anticipated side effects, of a metered-dose inhaler are discussed and demonstrated to the patient. With both dpi and hfa reviewed - if problems with using hfa saba then can change to respiclick so both saba and laba/ics are DPI.   At present, All goals of chronic asthma control met including optimal function and elimination of symptoms with minimal need for rescue therapy.  Contingencies discussed in full including contacting this office immediately if not controlling the symptoms using the rule of two's:  If your breathing worsens or you need to use your rescue inhaler more than twice weekly or wake up more than twice a month with any respiratory symptoms or require more than two rescue inhalers per year, we need to see you right away because this means we're not controlling the underlying problem (inflammation) adequately.  Rescue inhalers (albuterol) do not control inflammation and overuse can lead to unnecessary and costly consequences.  They can make you feel better temporarily but eventually they will quit working effectively much as sleep aids lead to more insomnia if used regularly.        >>> will refer to internal medicine for primary care and can see him here yearly for reefills  if pt or pcp desires or defer refills to pcp but of course always willing to see for acute resp flares.   Total time devoted to counseling  > 50 % of initial 45 min office visit:  reviewed case with pt/  device teaching which extended face to face time for this visit  discussion of options/alternatives/ personally creating written customized instructions  in presence of pt  then going over those specific  Instructions directly with the pt including how to use  all of the meds but in particular covering each new medication in detail and the difference between the maintenance= "automatic" meds and the prns using an action plan format for the latter (If this problem/symptom => do that organization reading Left to right).  Please see AVS from this visit for a full list of these instructions which I personally wrote for this pt and  are unique to this visit.

## 2019-05-03 ENCOUNTER — Telehealth: Payer: Self-pay | Admitting: Internal Medicine

## 2019-05-03 MED ORDER — FLUTICASONE-SALMETEROL 100-50 MCG/DOSE IN AEPB: 1.0000 | INHALATION_SPRAY | Freq: Two times a day (BID) | RESPIRATORY_TRACT | 5 refills | Status: DC

## 2019-05-03 NOTE — Telephone Encounter (Signed)
Called and spoke with pt letting him know that I was going to send Rx to pharmacy for him and pt verbalized understanding. Verified pt's preferred pharmacy and sent Rx to pharmacy for pt. Nothing further needed.

## 2019-05-05 IMAGING — CT CT MAXILLOFACIAL W/O CM
3 of 4 series · 10 of 47 positions shown, 11 images · non-contrast
Comparison: None.

CLINICAL DATA: Chronic sinusitis with unspecified location

EXAM:
CT MAXILLOFACIAL WITHOUT CONTRAST
TECHNIQUE: Multidetector CT images of the paranasal sinuses were obtained using
the standard protocol without intravenous contrast.

[Series 2: sinus 2.00 hr60 s3 ax · axial · 0.27mm/px · z∈[-636,-512]mm · 4 of 88 slices shown, 5 images]
[im 13/88  brain]
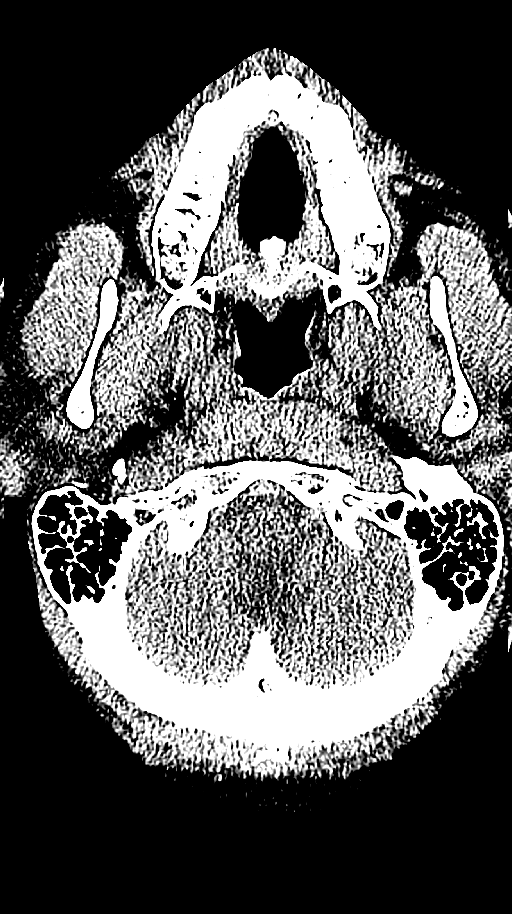
[im 13/88  bone]
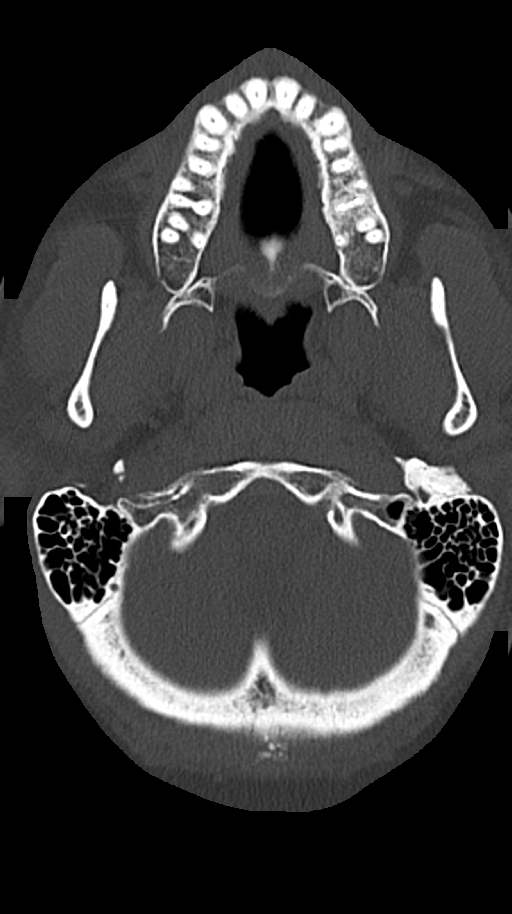
[im 32/88  bone]
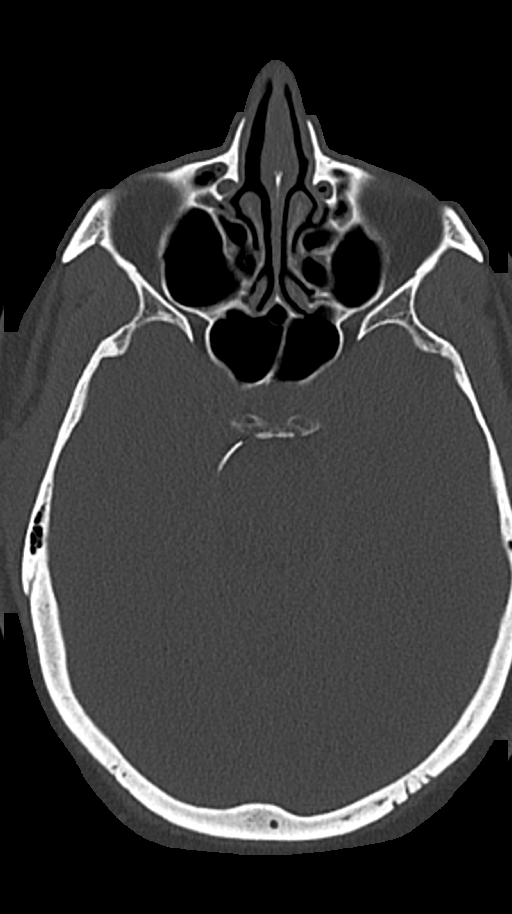
[im 56/88  bone]
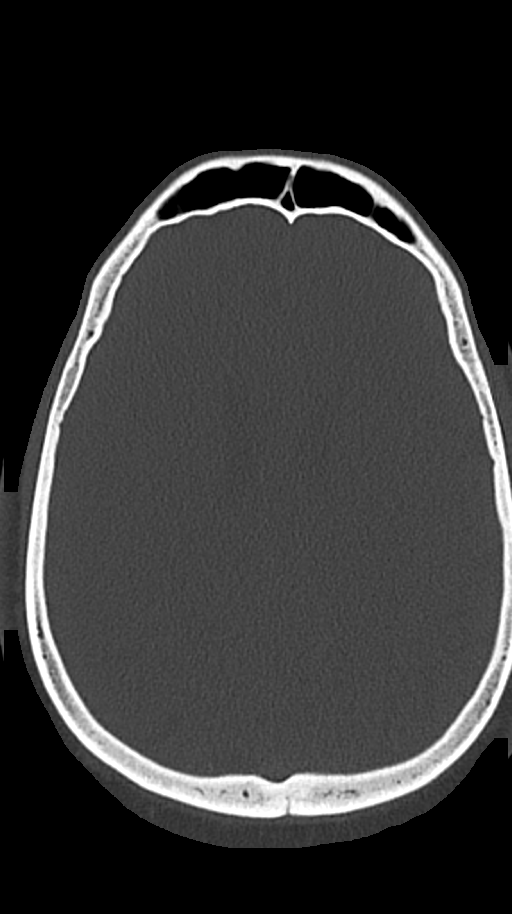
[im 75/88  bone]
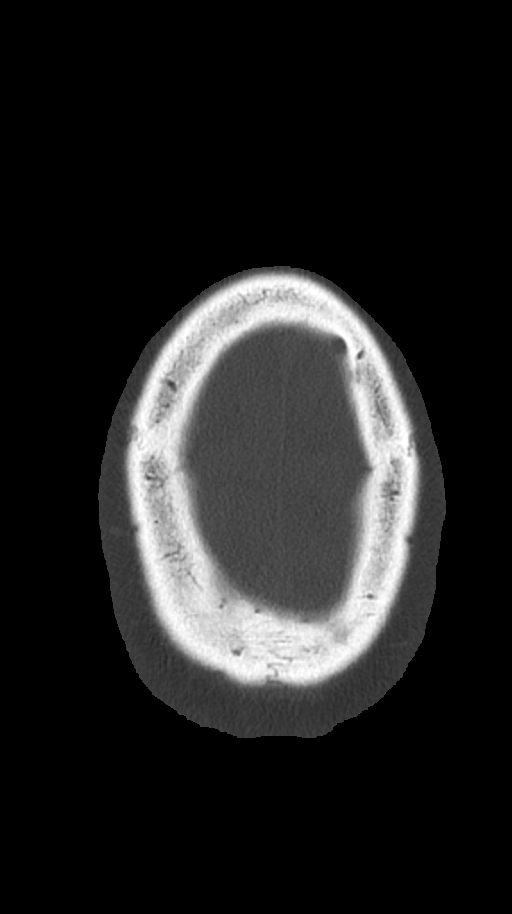

[Series 4: sinus 2.00 hr60 s3 cor · coronal · 0.27mm/px · 3 of 122 slices shown]
[im 41/122  bone]
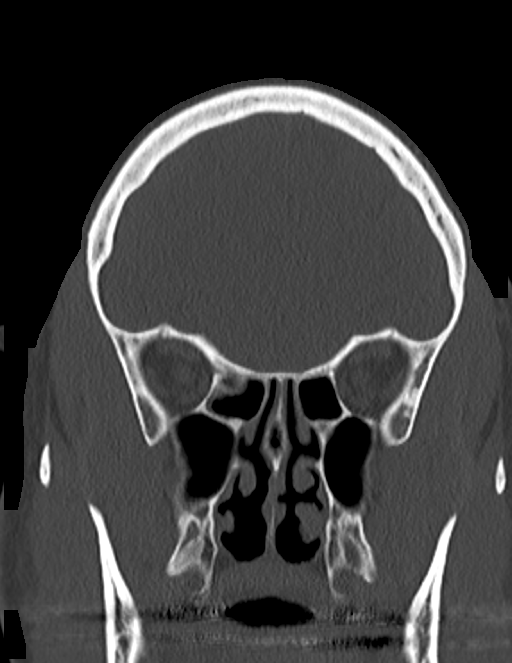
[im 54/122  bone]
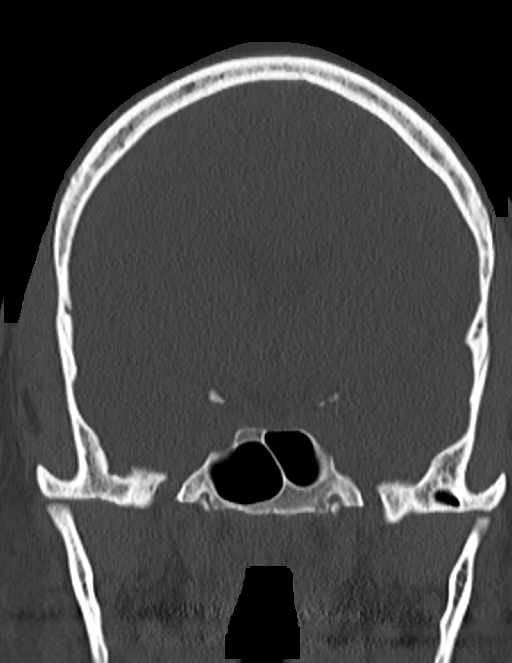
[im 68/122  bone]
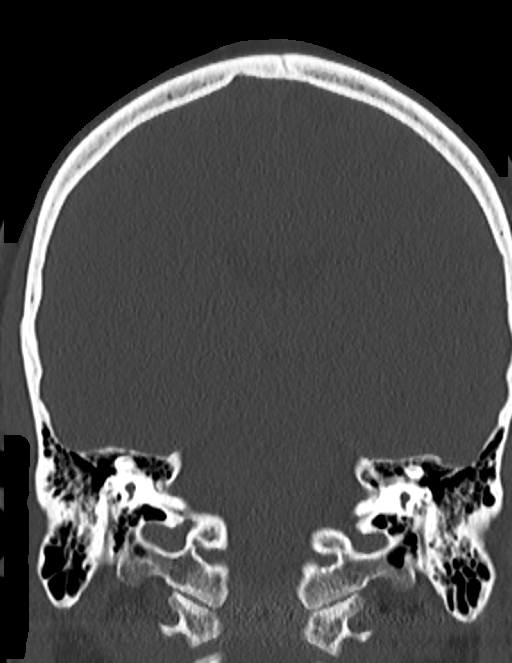

[Series 6: sinus 2.00 hr60 s3 sag · sagittal · 0.35mm/px · 3 of 68 slices shown]
[im 23/68  bone]
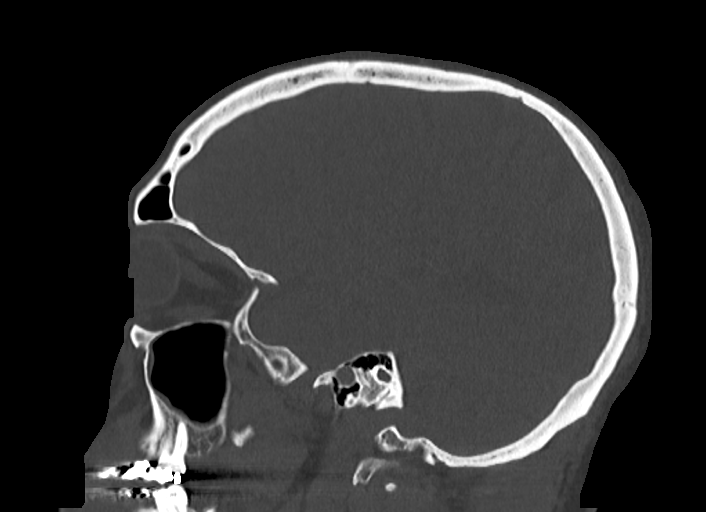
[im 34/68  bone]
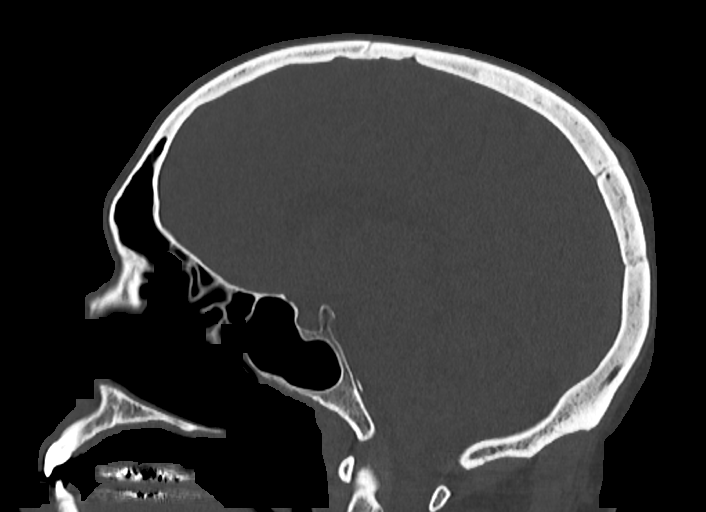
[im 45/68  bone]
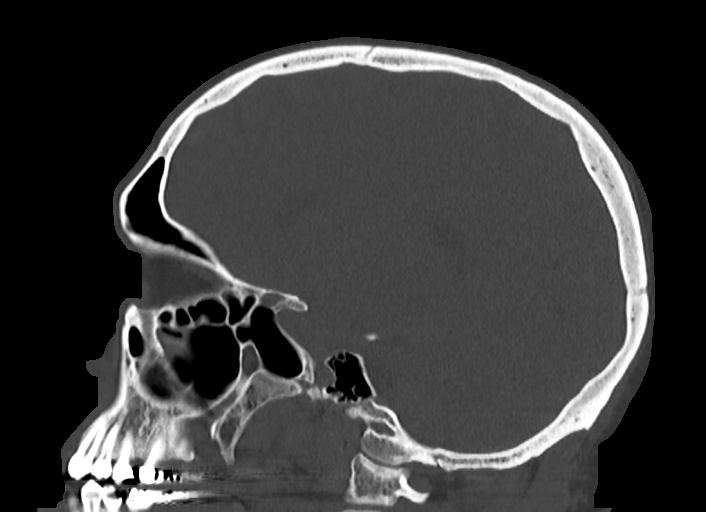

[10 of 47 positions shown; findings below may reference images not displayed]

FINDINGS: Paranasal sinuses:

Frontal: Expansive sinuses that are normally aerated. Mild frontal
ethmoidal recess mucosal thickening but patent outflow.

Ethmoid: Minor generalized mucosal thickening

Maxillary: Mild mucosal thickening on the right with retained
secretions. Minor mucosal thickening on the left as well.

Sphenoid: Secretions in the anterior right sphenoid sinus. Both
ostia and sphenoid ethmoidal recesses are patent.

Right ostiomeatal unit: Ethmoid bulla and mucosal thickening
completely effaces the right maxillary infundibulum. The middle
meatus is patent

Left ostiomeatal unit: Mucosal thickening effaces the maxillary
ostium. No bony outflow stenosis.

Nasal passages: Patent. Intact nasal septum is midline.

Anatomy: No pneumatization superior to anterior ethmoid notches.
Sellar sphenoid pneumatization pattern. No dehiscence of carotid or
optic canals. No onodi cell.

Other: Orbits and intracranial compartment are unremarkable. Visible
mastoid air cells are normally aerated.
IMPRESSION: 1.  Mild mucosal thickening in maxillary and ethmoid sinuses.
2. Ethmoid bulla and mucosal thickening effaces the right maxillary
infundibulum.
3. Mucosal thickening effaces the left maxillary ostium.

## 2019-05-24 ENCOUNTER — Ambulatory Visit (INDEPENDENT_AMBULATORY_CARE_PROVIDER_SITE_OTHER): Payer: 59

## 2019-05-24 ENCOUNTER — Other Ambulatory Visit: Payer: Self-pay

## 2019-05-24 DIAGNOSIS — R002 Palpitations: Secondary | ICD-10-CM | POA: Diagnosis not present

## 2019-05-24 DIAGNOSIS — I10 Essential (primary) hypertension: Secondary | ICD-10-CM

## 2019-05-31 ENCOUNTER — Ambulatory Visit (INDEPENDENT_AMBULATORY_CARE_PROVIDER_SITE_OTHER): Payer: 59 | Admitting: Cardiology

## 2019-05-31 ENCOUNTER — Encounter: Payer: Self-pay | Admitting: Cardiology

## 2019-05-31 ENCOUNTER — Other Ambulatory Visit: Payer: Self-pay

## 2019-05-31 VITALS — BP 158/92 | Ht 69.0 in | Wt 175.0 lb

## 2019-05-31 DIAGNOSIS — R002 Palpitations: Secondary | ICD-10-CM

## 2019-05-31 DIAGNOSIS — I10 Essential (primary) hypertension: Secondary | ICD-10-CM

## 2019-05-31 NOTE — Progress Notes (Signed)
Patient referred by No ref. provider found for hypertension  Subjective:   Jerry Horton, male    DOB: 1960-12-24, 58 y.o.   MRN: [id number]  I connected with the patient on 05/31/2019 by a video enabled telemedicine application and verified that I am speaking with the correct person using two identifiers.     I discussed the limitations of evaluation and management by telemedicine and the availability of in person appointments. The patient expressed understanding and agreed to proceed.   This visit type was conducted due to national recommendations for restrictions regarding the COVID-19 Pandemic (e.g. social distancing).  This format is felt to be most appropriate for this patient at this time.  All issues noted in this document were discussed and addressed.  No physical exam was performed (except for noted visual exam findings with Tele health visits).  The patient has consented to conduct a Tele health visit and understands insurance will be billed.   Chief Complaint  Patient presents with  . Palpitations    f/u after echo    HPI  58 y.o. African American male with hypertension, palpitations.   Echocardiogram findings discussed with the patient. Palpitations are occasional and do not affect his day to day activity. Blood pressure is elevated today. He has not increased his diltiazem to 240 mg as recommended.    Past Medical History:  Diagnosis Date  . Allergic rhinitis, cause unspecified   . Anxiety state, unspecified   . Asthma, moderate   . Epicondylitis, lateral, right CHRONIC  . History of echocardiogram 01/18/2007  . History of nuclear stress test 01/07/2011   negative   . History of nuclear stress test 05/09/2013   mild inferior wall ischemia, ST abnormalities   . Hypertension   . MVP (mitral valve prolapse) OCC . PALPITATIONS  . Palpitations OCCASIONAL  . Pulmonary nodule STABLE PER CT     Past Surgical History:  Procedure Laterality Date  . OPEN RESECTION  DISTAL RIGHT CLAVICLE AND [facility] JOINT ARTHRITIS  09-23-2010   DR Shellia Carwin  . REPAIR EXTENSOR TENDON  07/07/2012   Procedure: REPAIR EXTENSOR TENDON;  Surgeon: Magnus Sinning, MD;  Location: [place];  Service: Orthopedics;  Laterality: Right;  REPAIR OF COMMON EXTENSOR TENDON WITH PARTIAL LATERAL EPICONDYLECTOMY OF THE RIGHT ELBOW   . RESECTION OF DISTAL RIGHT CLAVICLE & ACROMION  09/2010   [facility]  . SHOULDER ARTHROSCOPY  01/2007   [facility]Aplington--  RIGHT SHOULDER  . UMBILICAL HERNIA REPAIR  CHILD     Social History   Socioeconomic History  . Marital status: Married    Spouse name: Armanii Pressnell  . Number of children: 2  . Years of education: Not on file  . Highest education level: Not on file  Occupational History  . Occupation: [employer]  Social Needs  . Financial resource strain: Not on file  . Food insecurity    Worry: Not on file    Inability: Not on file  . Transportation needs    Medical: Not on file    Non-medical: Not on file  Tobacco Use  . Smoking status: Never Smoker  . Smokeless tobacco: Never Used  Substance and Sexual Activity  . Alcohol use: No    Comment: Social  . Drug use: No  . Sexual activity: Not on file  Lifestyle  . Physical activity    Days per week: Not on file    Minutes per session: Not on file  . Stress: Not on  file  Relationships  . Social Herbalist on phone: Not on file    Gets together: Not on file    Attends religious service: Not on file    Active member of club or organization: Not on file    Attends meetings of clubs or organizations: Not on file    Relationship status: Not on file  . Intimate partner violence    Fear of current or ex partner: Not on file    Emotionally abused: Not on file    Physically abused: Not on file    Forced sexual activity: Not on file  Other Topics Concern  . Not on file  Social History Narrative  . Not on file     Family History  Problem Relation Age of Onset   . Hypertension Mother   . Cancer - Other Father        brain  . Colon cancer Neg Hx      Current Outpatient Medications on File Prior to Visit  Medication Sig Dispense Refill  . albuterol (PROAIR HFA) 108 (90 Base) MCG/ACT inhaler Inhale 2 puffs into the lungs every 6 (six) hours as needed. 3 Inhaler 3  . diltiazem (DILT-XR) 120 MG 24 hr capsule Take 120 mg by mouth daily.    . Fluticasone-Salmeterol (ADVAIR DISKUS) 100-50 MCG/DOSE AEPB Inhale 1 puff into the lungs 2 (two) times a day. 60 each 5  . Turmeric 400 MG CAPS Take by mouth.    . vitamin B-12 (CYANOCOBALAMIN) 500 MCG tablet Take 500 mcg by mouth daily.     No current facility-administered medications on file prior to visit.     Cardiovascular studies:  Echocardiogram 05/24/2019 :  Normal LV systolic function with EF 65%. Left ventricle cavity is normal in size. Mild concentric hypertrophy of the left ventricle. Normal global wall motion. Normal diastolic filling pattern. Calculated EF 65%. Structurally normal appearing tricuspid valve. Mild tricuspid regurgitation. No evidence of pulmonary hypertension. IVC is dilated with respiratory variation. This may suggest elevated right heart pressure, how ever in the absence of other associated findings, may only represent normal variant.  EKG 04/12/2019: Sinus rhythm 71 bpm. Nonspecific T-abnormality.   Stress test 2014: Overall Impression:  Low risk stress nuclear study with mild inferior wall ischemia. and    LV Wall Motion:  NL LV Function; NL Wall Motion  Recent labs: 03/27/2019:  Glucose 92. BUN/Cr 12/1.18. eGFR 79. Na/K 139/5.3.  Total bilirubin 1.3 (mildly elevated) H/H 15/44. MCV 92. Platelets 220 Chol 189, TG 84, HDL 71, LDL 101.  TSH 2.8.  Review of Systems  Constitution: Negative for decreased appetite, malaise/fatigue, weight gain and weight loss.  HENT: Negative for congestion.   Eyes: Negative for visual disturbance.  Cardiovascular: Positive for  palpitations (Infrequent, short lasting). Negative for chest pain, dyspnea on exertion, leg swelling and syncope.  Respiratory: Negative for cough.   Endocrine: Negative for cold intolerance.  Hematologic/Lymphatic: Does not bruise/bleed easily.  Skin: Negative for itching and rash.  Musculoskeletal: Negative for myalgias.  Gastrointestinal: Negative for abdominal pain, nausea and vomiting.  Genitourinary: Negative for dysuria.  Neurological: Negative for dizziness and weakness.  Psychiatric/Behavioral: The patient is not nervous/anxious.   All other systems reviewed and are negative.        Vitals:   05/31/19 1319  BP: (!) 158/92     Body mass index is 25.84 kg/m. Filed Weights   05/31/19 1319  Weight: 175 lb (79.4 kg)    Objective:  Physical Exam  Not performed. Telephone encounter.        Assessment & Recommendations:   58 y.o. African American male with hypertension, palpitations.   1. Palpitations Infrequent and short lasting.  Unlikely to be serious arrhythmias. Reassuring echocardiogram.   2. Essential hypertension Increased diltiazem from 120 mg daily to 240 mg daily.  Continue follow-up with PCP.  3. Screening cholesterol level Reviewed. No statin needed.  I will see him on as-needed basis.Recommend establishing care with PCP.   [name], MD [facility] Pager: [phone] Office: [phone] If no answer Cell [phone]

## 2019-07-16 ENCOUNTER — Ambulatory Visit (INDEPENDENT_AMBULATORY_CARE_PROVIDER_SITE_OTHER): Payer: 59 | Admitting: Orthopaedic Surgery

## 2019-07-16 ENCOUNTER — Encounter: Payer: Self-pay | Admitting: Orthopaedic Surgery

## 2019-07-16 DIAGNOSIS — M5442 Lumbago with sciatica, left side: Secondary | ICD-10-CM

## 2019-07-16 DIAGNOSIS — G8929 Other chronic pain: Secondary | ICD-10-CM

## 2019-07-16 DIAGNOSIS — M542 Cervicalgia: Secondary | ICD-10-CM | POA: Diagnosis not present

## 2019-07-16 MED ORDER — METHYLPREDNISOLONE 4 MG PO TABS: ORAL_TABLET | ORAL | 0 refills | Status: DC

## 2019-07-16 NOTE — Progress Notes (Signed)
Office Visit Note   Patient: Jerry Horton           Date of Birth: August 07, 1961           MRN: [id number] Visit Date: 07/16/2019              Requested by: No referring provider defined for this encounter. PCP: Patient, No Pcp Per   Assessment & Plan: Visit Diagnoses:  1. Cervicalgia   2. Chronic left-sided low back pain with left-sided sciatica     Plan: At this point given his worsening radicular symptoms combined with the severe foraminal stenosis at C6-C7, I would like to send him for a neurosurgical evaluation.  I will start him on a 6-day steroid taper just to calm down some of his radicular symptoms while we work on making this referral.  He is agreeable to this as well.  All question concerns were answered and addressed.  Follow-Up Instructions: We make a referral to Neurosurgery  Orders:  No orders of the defined types were placed in this encounter.  Meds ordered this encounter  Medications  . methylPREDNISolone (MEDROL) 4 MG tablet    Sig: Medrol dose pack. Take as instructed    Dispense:  21 tablet    Refill:  0      Procedures: No procedures performed   Clinical Data: No additional findings.   Subjective: Chief Complaint  Patient presents with  . Neck - Pain  The patient comes in today with worsening radicular symptoms around his left upper extremity in the parascapular area and going down his left arm.  I actually saw him for this and May 2019.  An MRI showed severe foraminal stenosis to the left side at C6-C7.  He went just to physical therapy at the time and anti-inflammatories and things did improve somewhat.  He says now the pain is worsening and it is getting worse in terms of the symptoms going down his left arm.  He is getting numbness and tingling in his hand as well.  HPI  Review of Systems He currently denies any headache, chest pain, short of breath, fever, chills, nausea, vomiting  Objective: Vital Signs: There were no vitals taken  for this visit.  Physical Exam He is alert and oriented x3 and in no acute distress Ortho Exam Examination of his left upper extremity shows numbness in the back and lateral aspect of his hand.  There is slightly weak grip strength on the left side but his triceps is strong.  He has a positive Spurling sign to the left side as well as pain of motion of his cervical spine. Specialty Comments:  No specialty comments available.  Imaging: No results found. I did go over the MRI findings with him again from last year of her cervical spine that showed severe foraminal stenosis at C6-C7 to the left.  We went over the MRI images with him as well.  PMFS History: Patient Active Problem List   Diagnosis Date Noted  . Essential hypertension 04/12/2019  . Screening cholesterol level 01/02/2015  . Neck pain 12/09/2011  . Incidental pulmonary nodule, > 41mm and < 39mm 09/20/2011  . VOCAL CORD DISORDER 06/29/2008  . DYSPEPSIA 06/29/2008  . BACK PAIN, LUMBAR 06/29/2008  . PALPITATIONS 06/29/2008  . CHEST PAIN, ATYPICAL 06/29/2008  . Anxiety state 02/22/2008  . Allergic rhinitis 02/22/2008  . Asthma 02/22/2008   Past Medical History:  Diagnosis Date  . Allergic rhinitis, cause unspecified   . Anxiety  state, unspecified   . Asthma, moderate   . Epicondylitis, lateral, right CHRONIC  . History of echocardiogram 01/18/2007  . History of nuclear stress test 01/07/2011   negative   . History of nuclear stress test 05/09/2013   mild inferior wall ischemia, ST abnormalities   . Hypertension   . MVP (mitral valve prolapse) OCC . PALPITATIONS  . Palpitations OCCASIONAL  . Pulmonary nodule STABLE PER CT    Family History  Problem Relation Age of Onset  . Hypertension Mother   . Cancer - Other Father        brain  . Colon cancer Neg Hx     Past Surgical History:  Procedure Laterality Date  . OPEN RESECTION DISTAL RIGHT CLAVICLE AND [facility] JOINT ARTHRITIS  09-23-2010   DR Shellia Carwin  . REPAIR  EXTENSOR TENDON  07/07/2012   Procedure: REPAIR EXTENSOR TENDON;  Surgeon: Magnus Sinning, MD;  Location: [place];  Service: Orthopedics;  Laterality: Right;  REPAIR OF COMMON EXTENSOR TENDON WITH PARTIAL LATERAL EPICONDYLECTOMY OF THE RIGHT ELBOW   . RESECTION OF DISTAL RIGHT CLAVICLE & ACROMION  09/2010   [facility]  . SHOULDER ARTHROSCOPY  01/2007   [facility]--  RIGHT SHOULDER  . UMBILICAL HERNIA REPAIR  CHILD   Social History   Occupational History  . Occupation: [employer]  Tobacco Use  . Smoking status: Never Smoker  . Smokeless tobacco: Never Used  Substance and Sexual Activity  . Alcohol use: No    Comment: Social  . Drug use: No  . Sexual activity: Not on file

## 2020-03-29 ENCOUNTER — Ambulatory Visit: Payer: 59 | Attending: Internal Medicine

## 2020-03-29 DIAGNOSIS — Z23 Encounter for immunization: Secondary | ICD-10-CM

## 2020-03-29 NOTE — Progress Notes (Signed)
   Covid-19 Vaccination Clinic  Name:  Jerry Horton    MRN: [id number] DOB: 01/03/1961  03/29/2020  [name] was observed post Covid-19 immunization for 15 minutes without incident. He was provided with Vaccine Information Sheet and instruction to access the V-Safe system.   [name] was instructed to call 911 with any severe reactions post vaccine: Marland Kitchen Difficulty breathing  . Swelling of face and throat  . A fast heartbeat  . A bad rash all over body  . Dizziness and weakness   Immunizations Administered    Name Date Dose VIS Date Route   Pfizer COVID-19 Vaccine 03/29/2020  8:57 AM 0.3 mL 12/19/2018 Intramuscular   Manufacturer: [place]   Lot: [id number]   [place]: [id number]

## 2020-04-21 ENCOUNTER — Ambulatory Visit: Payer: 59 | Attending: Internal Medicine

## 2020-04-21 DIAGNOSIS — Z23 Encounter for immunization: Secondary | ICD-10-CM

## 2020-04-21 NOTE — Progress Notes (Signed)
   Covid-19 Vaccination Clinic  Name:  Jerry Horton    MRN: [id number] DOB: Jun 24, 1961  04/21/2020  [name] was observed post Covid-19 immunization for 15 minutes without incident. He was provided with Vaccine Information Sheet and instruction to access the V-Safe system.   [name] was instructed to call 911 with any severe reactions post vaccine: Marland Kitchen Difficulty breathing  . Swelling of face and throat  . A fast heartbeat  . A bad rash all over body  . Dizziness and weakness   Immunizations Administered    Name Date Dose VIS Date Route   Pfizer COVID-19 Vaccine 04/21/2020 11:54 AM 0.3 mL 12/19/2018 Intramuscular   Manufacturer: [place]   Lot: [id number]   [place]: [id number]

## 2020-04-24 ENCOUNTER — Telehealth: Payer: Self-pay

## 2020-04-24 NOTE — Telephone Encounter (Signed)
Telephone encounter:  Reason for call: Palpitations for one day. Pt Has not been taking Diltizaem, just start back yesterday when the palps started . Pt wanted appointment your first avaiable is 7/12  Usual provider: MP  Last office visit: 05/31/2019  Next office visit: n/a was made PRN   Last hospitalization: NA   Current Outpatient Medications on File Prior to Visit  Medication Sig Dispense Refill  . albuterol (PROAIR HFA) 108 (90 Base) MCG/ACT inhaler Inhale 2 puffs into the lungs every 6 (six) hours as needed. 3 Inhaler 3  . diltiazem (DILT-XR) 120 MG 24 hr capsule Take 120 mg by mouth daily.    . Fluticasone-Salmeterol (ADVAIR DISKUS) 100-50 MCG/DOSE AEPB Inhale 1 puff into the lungs 2 (two) times a day. 60 each 5  . methylPREDNISolone (MEDROL) 4 MG tablet Medrol dose pack. Take as instructed 21 tablet 0  . Turmeric 400 MG CAPS Take by mouth.    . vitamin B-12 (CYANOCOBALAMIN) 500 MCG tablet Take 500 mcg by mouth daily.     No current facility-administered medications on file prior to visit.

## 2020-04-24 NOTE — Telephone Encounter (Signed)
7/12 is okay. Continue diltiazem for now.

## 2020-06-02 ENCOUNTER — Other Ambulatory Visit: Payer: Self-pay | Admitting: Internal Medicine

## 2020-07-08 ENCOUNTER — Ambulatory Visit: Payer: Self-pay

## 2020-07-08 ENCOUNTER — Ambulatory Visit: Payer: 59 | Admitting: Orthopaedic Surgery

## 2020-07-08 ENCOUNTER — Encounter: Payer: Self-pay | Admitting: Orthopaedic Surgery

## 2020-07-08 DIAGNOSIS — M4807 Spinal stenosis, lumbosacral region: Secondary | ICD-10-CM | POA: Diagnosis not present

## 2020-07-08 DIAGNOSIS — G8929 Other chronic pain: Secondary | ICD-10-CM | POA: Diagnosis not present

## 2020-07-08 DIAGNOSIS — M5442 Lumbago with sciatica, left side: Secondary | ICD-10-CM | POA: Diagnosis not present

## 2020-07-08 NOTE — Addendum Note (Signed)
Addended by: Michae Kava B on: 07/08/2020 11:27 AM   Modules accepted: Orders

## 2020-07-08 NOTE — Progress Notes (Signed)
Office Visit Note   Patient: Jerry Horton           Date of Birth: 1961-08-20           MRN: [id number] Visit Date: 07/08/2020              Requested by: No referring provider defined for this encounter. PCP: Patient, No Pcp Per   Assessment & Plan: Visit Diagnoses:  1. Chronic left-sided low back pain with left-sided sciatica     Plan: Due to the fact the patient has tried formal physical therapy, medications and time continues to have low back pain that radiates particularly down the left leg recommend MRI to rule out HNP as a source of his pain.  Given his clinical exam today which reveals bilateral positive straight leg raise and the fact that he is being awakened by the pain in his low back do feel that an MRI is warranted.  He will follow up with [place] 5 days after the MRI to go over results discuss further treatment.  He will continue his Flexeril and naproxen for the low back pain along with moist heat to low back.  Questions were encouraged and answered at length.  Follow-Up Instructions: Return After MRI.   Orders:  Orders Placed This Encounter  Procedures  . XR Lumbar Spine 2-3 Views   No orders of the defined types were placed in this encounter.     Procedures: No procedures performed   Clinical Data: No additional findings.   Subjective: Chief Complaint  Patient presents with  . Lower Back - Pain    HPI Jerry Horton is well-known to [place] comes in today with recurrent low back pain.  He states his pain is became worse over the last week.  He has had on and off back pain for years particularly since last spring.  He was seen elsewhere and given a Medrol Dosepak and Flexeril.  He is try doing his home exercise which she was taught by physical therapy.  He states this time his pain is just not going away and is awakening him.  Denies any bowel or bladder dysfunction.  Denies any saddle anesthesia.  Denies any numbness tingling down either leg.  He  does have a pain though down to his left thigh and sometimes but rarely down into his foot.  He denies any recent fevers chills shortness of breath chest pain.  Review of Systems  Constitutional: Negative for chills and fever.  Respiratory: Negative for shortness of breath.   Cardiovascular: Negative for chest pain.  Gastrointestinal: Negative for constipation.  Musculoskeletal: Positive for back pain.     Objective: Vital Signs: There were no vitals taken for this visit.  Physical Exam Constitutional:      Appearance: He is normal weight. He is not ill-appearing or diaphoretic.  Cardiovascular:     Pulses: Normal pulses.  Pulmonary:     Effort: Pulmonary effort is normal.  Neurological:     Mental Status: He is alert and oriented to person, place, and time.  Psychiatric:        Behavior: Behavior normal.     Ortho Exam Lower extremities 5 out of 5 strength throughout against resistance.  Positive straight leg raise bilaterally.  Deep tendon reflexes equal and symmetric bilateral knees and ankles.  Full sensation bilateral feet to light touch.  Dorsal pedal pulses are 2+ bilaterally. Specialty Comments:  No specialty comments available.  Imaging: XR Lumbar Spine 2-3 Views  Result Date: 07/08/2020 2 views lumbar spine compared to 2019 films shows no significant changes.  There is no acute fractures.  Endplate spurring at Z6-1 also there is slight retrolisthesis at L3-4.  Normal lordotic curvature.  Lower lumbar facet changes.    PMFS History: Patient Active Problem List   Diagnosis Date Noted  . Essential hypertension 04/12/2019  . Screening cholesterol level 01/02/2015  . Neck pain 12/09/2011  . Incidental pulmonary nodule, > 21mm and < 23mm 09/20/2011  . VOCAL CORD DISORDER 06/29/2008  . DYSPEPSIA 06/29/2008  . BACK PAIN, LUMBAR 06/29/2008  . PALPITATIONS 06/29/2008  . CHEST PAIN, ATYPICAL 06/29/2008  . Anxiety state 02/22/2008  . Allergic rhinitis 02/22/2008  .  Asthma 02/22/2008   Past Medical History:  Diagnosis Date  . Allergic rhinitis, cause unspecified   . Anxiety state, unspecified   . Asthma, moderate   . Epicondylitis, lateral, right CHRONIC  . History of echocardiogram 01/18/2007  . History of nuclear stress test 01/07/2011   negative   . History of nuclear stress test 05/09/2013   mild inferior wall ischemia, ST abnormalities   . Hypertension   . MVP (mitral valve prolapse) OCC . PALPITATIONS  . Palpitations OCCASIONAL  . Pulmonary nodule STABLE PER CT    Family History  Problem Relation Age of Onset  . Hypertension Mother   . Cancer - Other Father        brain  . Colon cancer Neg Hx     Past Surgical History:  Procedure Laterality Date  . OPEN RESECTION DISTAL RIGHT CLAVICLE AND [facility] JOINT ARTHRITIS  09-23-2010   DR Shellia Carwin  . REPAIR EXTENSOR TENDON  07/07/2012   Procedure: REPAIR EXTENSOR TENDON;  Surgeon: Magnus Sinning, MD;  Location: [place];  Service: Orthopedics;  Laterality: Right;  REPAIR OF COMMON EXTENSOR TENDON WITH PARTIAL LATERAL EPICONDYLECTOMY OF THE RIGHT ELBOW   . RESECTION OF DISTAL RIGHT CLAVICLE & ACROMION  09/2010   [facility]  . SHOULDER ARTHROSCOPY  01/2007   [facility]--  RIGHT SHOULDER  . UMBILICAL HERNIA REPAIR  CHILD   Social History   Occupational History  . Occupation: [employer]  Tobacco Use  . Smoking status: Never Smoker  . Smokeless tobacco: Never Used  Substance and Sexual Activity  . Alcohol use: No    Comment: Social  . Drug use: No  . Sexual activity: Not on file

## 2020-07-28 ENCOUNTER — Ambulatory Visit
Admission: RE | Admit: 2020-07-28 | Discharge: 2020-07-28 | Disposition: A | Discharge status: Home / Self Care | Payer: 59 | Source: Ambulatory Visit | Attending: Physician Assistant | Admitting: Physician Assistant

## 2020-07-28 ENCOUNTER — Other Ambulatory Visit: Payer: Self-pay

## 2020-07-28 DIAGNOSIS — M4807 Spinal stenosis, lumbosacral region: Secondary | ICD-10-CM

## 2020-07-30 ENCOUNTER — Ambulatory Visit: Payer: 59 | Admitting: Orthopaedic Surgery

## 2020-08-04 ENCOUNTER — Encounter: Payer: Self-pay | Admitting: Physician Assistant

## 2020-08-04 ENCOUNTER — Ambulatory Visit: Payer: 59 | Admitting: Physician Assistant

## 2020-08-04 DIAGNOSIS — M5416 Radiculopathy, lumbar region: Secondary | ICD-10-CM

## 2020-08-04 NOTE — Progress Notes (Signed)
HPI: [name] returns today to review the MRI of his lumbar spine.  He states that the radicular symptoms down the left leg remained unchanged.  Again he is tried home exercises as shown to him by physical therapy is been given Medrol Dosepak and Flexeril.  He has also seen a Restaurant manager, fast food. MRI images lumbar spine are reviewed with the patient.  MRI dated 07/28/2020 showed a new shallow disc protrusion that results in mild left neural foraminal stenosis.  L3-4 show minimal progression of mild to moderate bilateral neuroforaminal stenosis.  L4-5 mild spinal canal stenosis and mild to moderate right and left stenosis.  L5-S1 small disc protrusion indenting the thecal sac but no spinal or foraminal stenosis at this level.  Review of systems: Negative for fevers chills shortness of breath.  Positive for low back pain with radicular symptoms left leg.  Physical exam General well-developed well-nourished male in no acute distress.  Ambulates without any assistive device.  Impression: Lumbar radicular pain left leg  Plan: We will refer him for epidural steroid injections lumbar spine.  He will follow up with [place] 4 weeks after the epidural steroid injection to see how he is doing overall.  Recommend he continue to work on stretching and core strengthening.  Questions were encouraged and answered.

## 2020-08-05 ENCOUNTER — Other Ambulatory Visit: Payer: Self-pay | Admitting: Radiology

## 2020-08-05 DIAGNOSIS — M5416 Radiculopathy, lumbar region: Secondary | ICD-10-CM

## 2020-08-05 DIAGNOSIS — M5442 Lumbago with sciatica, left side: Secondary | ICD-10-CM

## 2020-08-05 DIAGNOSIS — G8929 Other chronic pain: Secondary | ICD-10-CM

## 2020-08-06 ENCOUNTER — Telehealth: Payer: Self-pay | Admitting: Physical Medicine and Rehabilitation

## 2020-08-06 NOTE — Telephone Encounter (Signed)
Called pt back and sch 11/8.

## 2020-08-06 NOTE — Telephone Encounter (Signed)
Patient called. Returning a call to schedule an appointment with Dr. Ernestina Patches.

## 2020-08-22 ENCOUNTER — Other Ambulatory Visit: Payer: Self-pay | Admitting: Emergency Medicine

## 2020-08-22 ENCOUNTER — Ambulatory Visit
Admission: RE | Admit: 2020-08-22 | Discharge: 2020-08-22 | Disposition: A | Discharge status: Home / Self Care | Payer: Worker's Compensation | Source: Ambulatory Visit | Attending: Emergency Medicine | Admitting: Emergency Medicine

## 2020-08-22 DIAGNOSIS — R52 Pain, unspecified: Secondary | ICD-10-CM

## 2020-09-01 ENCOUNTER — Ambulatory Visit: Payer: 59 | Admitting: Physical Medicine and Rehabilitation

## 2020-09-01 ENCOUNTER — Encounter: Payer: Self-pay | Admitting: Physical Medicine and Rehabilitation

## 2020-09-01 ENCOUNTER — Ambulatory Visit: Payer: Self-pay

## 2020-09-01 ENCOUNTER — Other Ambulatory Visit: Payer: Self-pay

## 2020-09-01 VITALS — BP 119/70 | HR 71

## 2020-09-01 DIAGNOSIS — M5416 Radiculopathy, lumbar region: Secondary | ICD-10-CM

## 2020-09-01 MED ORDER — METHYLPREDNISOLONE ACETATE 80 MG/ML IJ SUSP
80.0000 mg | Freq: Once | INTRAMUSCULAR | Status: AC
  Administered 2020-09-01: 80 mg

## 2020-09-01 NOTE — Procedures (Signed)
Lumbosacral Transforaminal Epidural Steroid Injection - Sub-Pedicular Approach with Fluoroscopic Guidance  Patient: Jerry Horton      Date of Birth: 25-Jun-1961 MRN: [id number] PCP: Patient, No Pcp Per      Visit Date: 09/01/2020   Universal Protocol:    Date/Time: 09/01/2020  Consent Given By: the patient  Position: PRONE  Additional Comments: Vital signs were monitored before and after the procedure. Patient was prepped and draped in the usual sterile fashion. The correct patient, procedure, and site was verified.   Injection Procedure Details:   Procedure diagnoses:  1. Lumbar radiculopathy      Meds Administered:  Meds ordered this encounter  Medications  . methylPREDNISolone acetate (DEPO-MEDROL) injection 80 mg    Laterality: Left  Location/Site:  L4-L5  Needle:5.0 in., 22 ga.  Short bevel or Quincke spinal needle  Needle Placement: Transforaminal  Findings:    -Comments: Excellent flow of contrast along the nerve, nerve root and into the epidural space.  Procedure Details: After squaring off the end-plates to get a true AP view, the C-arm was positioned so that an oblique view of the foramen as noted above was visualized. The target area is just inferior to the "nose of the scotty dog" or sub pedicular. The soft tissues overlying this structure were infiltrated with 2-3 ml. of 1% Lidocaine without Epinephrine.  The spinal needle was inserted toward the target using a "trajectory" view along the fluoroscope beam.  Under AP and lateral visualization, the needle was advanced so it did not puncture dura and was located close the 6 O'Clock position of the pedical in AP tracterory. Biplanar projections were used to confirm position. Aspiration was confirmed to be negative for CSF and/or blood. A 1-2 ml. volume of Isovue-250 was injected and flow of contrast was noted at each level. Radiographs were obtained for documentation purposes.   After attaining the  desired flow of contrast documented above, a 0.5 to 1.0 ml test dose of 0.25% Marcaine was injected into each respective transforaminal space.  The patient was observed for 90 seconds post injection.  After no sensory deficits were reported, and normal lower extremity motor function was noted,   the above injectate was administered so that equal amounts of the injectate were placed at each foramen (level) into the transforaminal epidural space.   Additional Comments:  The patient tolerated the procedure well Dressing: 2 x 2 sterile gauze and Band-Aid    Post-procedure details: Patient was observed during the procedure. Post-procedure instructions were reviewed.  Patient left the clinic in stable condition.

## 2020-09-01 NOTE — Progress Notes (Signed)
Pt state lower back pain. Pt state when he get out of bed in the morning that's when his pain hurts the most. Pt state he strengths and takes pain meds to helps ease the pain.   Numeric Pain Rating Scale and Functional Assessment Average Pain 5   In the last MONTH (on 0-10 scale) has pain interfered with the following?  1. General activity like being  able to carry out your everyday physical activities such as walking, climbing stairs, carrying groceries, or moving a chair?  Rating(5)   +Driver, -BT, -Dye Allergies.

## 2020-09-01 NOTE — Progress Notes (Signed)
Jerry Horton - 59 y.o. male MRN [id number]  Date of birth: 1961-06-27  Office Visit Note: Visit Date: 09/01/2020 PCP: Patient, No Pcp Per Referred by: Pete Pelt, PA-C  Subjective: Chief Complaint  Patient presents with  . Lower Back - Pain   HPI:  Jerry Horton is a 59 y.o. male who comes in today at the request of Benita Stabile, PA-C for planned Left L4-5 Lumbar epidural steroid injection with fluoroscopic guidance.  The patient has failed conservative care including home exercise, medications, time and activity modification.  This injection will be diagnostic and hopefully therapeutic.  Please see requesting physician notes for further details and justification.  MRI reviewed with images and spine model.  MRI reviewed in the note below.    ROS Otherwise per HPI.  Assessment & Plan: Visit Diagnoses:  1. Lumbar radiculopathy     Plan: No additional findings.   Meds & Orders:  Meds ordered this encounter  Medications  . methylPREDNISolone acetate (DEPO-MEDROL) injection 80 mg    Orders Placed This Encounter  Procedures  . XR C-ARM NO REPORT  . Epidural Steroid injection    Follow-up: Return if symptoms worsen or fail to improve.   Procedures: No procedures performed  Lumbosacral Transforaminal Epidural Steroid Injection - Sub-Pedicular Approach with Fluoroscopic Guidance  Patient: Jerry Horton      Date of Birth: Jan 25, 1961 MRN: [id number] PCP: Patient, No Pcp Per      Visit Date: 09/01/2020   Universal Protocol:    Date/Time: 09/01/2020  Consent Given By: the patient  Position: PRONE  Additional Comments: Vital signs were monitored before and after the procedure. Patient was prepped and draped in the usual sterile fashion. The correct patient, procedure, and site was verified.   Injection Procedure Details:   Procedure diagnoses:  1. Lumbar radiculopathy      Meds Administered:  Meds ordered this encounter  Medications  .  methylPREDNISolone acetate (DEPO-MEDROL) injection 80 mg    Laterality: Left  Location/Site:  L4-L5  Needle:5.0 in., 22 ga.  Short bevel or Quincke spinal needle  Needle Placement: Transforaminal  Findings:    -Comments: Excellent flow of contrast along the nerve, nerve root and into the epidural space.  Procedure Details: After squaring off the end-plates to get a true AP view, the C-arm was positioned so that an oblique view of the foramen as noted above was visualized. The target area is just inferior to the "nose of the scotty dog" or sub pedicular. The soft tissues overlying this structure were infiltrated with 2-3 ml. of 1% Lidocaine without Epinephrine.  The spinal needle was inserted toward the target using a "trajectory" view along the fluoroscope beam.  Under AP and lateral visualization, the needle was advanced so it did not puncture dura and was located close the 6 O'Clock position of the pedical in AP tracterory. Biplanar projections were used to confirm position. Aspiration was confirmed to be negative for CSF and/or blood. A 1-2 ml. volume of Isovue-250 was injected and flow of contrast was noted at each level. Radiographs were obtained for documentation purposes.   After attaining the desired flow of contrast documented above, a 0.5 to 1.0 ml test dose of 0.25% Marcaine was injected into each respective transforaminal space.  The patient was observed for 90 seconds post injection.  After no sensory deficits were reported, and normal lower extremity motor function was noted,   the above injectate was administered so that equal amounts of the  injectate were placed at each foramen (level) into the transforaminal epidural space.   Additional Comments:  The patient tolerated the procedure well Dressing: 2 x 2 sterile gauze and Band-Aid    Post-procedure details: Patient was observed during the procedure. Post-procedure instructions were reviewed.  Patient left the clinic  in stable condition.      Clinical History: MRI LUMBAR SPINE WITHOUT CONTRAST  TECHNIQUE: Multiplanar, multisequence MR imaging of the lumbar spine was performed. No intravenous contrast was administered.  COMPARISON:  MRI of the lumbar spine May 24, 2015.  FINDINGS: Segmentation:  Standard.  Alignment:  Minimal retrolisthesis of L3 over L4, unchanged.  Vertebrae:  No fracture, evidence of discitis, or bone lesion.  Conus medullaris and cauda equina: Conus extends to the L1 level. Conus and cauda equina appear normal.  Paraspinal and other soft tissues: Negative.  Disc levels:  T12-L1: No spinal canal or neural foraminal stenosis.  L1-2: No spinal neural stenosis.  L2-3: Shallow disc bulge with associated the small left foraminal disc protrusion and mild facet degenerative changes resulting mild left neural foraminal, new from prior. No significant spinal canal stenosis.  L3-4: Loss of disc height, disc bulge, facet degenerative changes and ligamentum flavum redundancy resulting in mild spinal canal stenosis with narrowing of the bilateral subarticular zones and mild to moderate bilateral neural foraminal narrowing. Findings have minimally progressed from prior MRI.  L4-5: Left asymmetric disc bulge, facet degenerative changes and ligamentum flavum redundancy resulting in mild spinal canal stenosis with narrowing of the bilateral subarticular zones, left greater than right, mild-to-moderate right and mild left neural foraminal narrowing. Findings have mildly progressed from prior.  L5-S1: Small central disc protrusion causing small indentation on the thecal, new from prior. Mild facet degenerative changes. No significant spinal canal or foraminal stenosis.  IMPRESSION: 1. Multilevel degenerative changes of the lumbar spine, mildly progressed from prior MRI. 2. Mild spinal canal stenosis and mild to moderate bilateral neural foraminal narrowing  at L3-L4 3. Mild spinal canal stenosis mild to moderate right left at L4-L5,. 4. New small central disc protrusion at L5-S1 causing small indentation on the thecal sac without significant spinal canal stenosis.   Electronically Signed   By: Pedro Earls M.D.   On: 07/28/2020 17:32     Objective:  VS:  HT:    WT:   BMI:     BP:119/70  HR:71bpm  TEMP: ( )  RESP:  Physical Exam Constitutional:      General: He is not in acute distress.    Appearance: Normal appearance. He is not ill-appearing.  HENT:     Head: Normocephalic and atraumatic.     Right Ear: External ear normal.     Left Ear: External ear normal.  Eyes:     Extraocular Movements: Extraocular movements intact.  Cardiovascular:     Rate and Rhythm: Normal rate.     Pulses: Normal pulses.  Abdominal:     General: There is no distension.     Palpations: Abdomen is soft.  Musculoskeletal:        General: No tenderness or signs of injury.     Right lower leg: No edema.     Left lower leg: No edema.     Comments: Patient has good distal strength without clonus.  Skin:    Findings: No erythema or rash.  Neurological:     General: No focal deficit present.     Mental Status: He is alert and oriented to person,  place, and time.     Sensory: No sensory deficit.     Motor: No weakness or abnormal muscle tone.     Coordination: Coordination normal.  Psychiatric:        Mood and Affect: Mood normal.        Behavior: Behavior normal.      Imaging: XR C-ARM NO REPORT  Result Date: 09/01/2020 Please see Notes tab for imaging impression.

## 2020-09-17 ENCOUNTER — Encounter: Payer: Self-pay | Admitting: Cardiology

## 2020-09-17 ENCOUNTER — Other Ambulatory Visit: Payer: Self-pay

## 2020-09-17 ENCOUNTER — Ambulatory Visit: Payer: 59

## 2020-09-17 ENCOUNTER — Ambulatory Visit: Payer: 59 | Admitting: Cardiology

## 2020-09-17 VITALS — BP 148/88 | HR 61 | Resp 16 | Ht 69.0 in | Wt 183.0 lb

## 2020-09-17 DIAGNOSIS — I10 Essential (primary) hypertension: Secondary | ICD-10-CM

## 2020-09-17 DIAGNOSIS — R0683 Snoring: Secondary | ICD-10-CM

## 2020-09-17 DIAGNOSIS — R002 Palpitations: Secondary | ICD-10-CM

## 2020-09-17 MED ORDER — DILTIAZEM HCL ER 120 MG PO CP24: 120.0000 mg | ORAL_CAPSULE | Freq: Every day | ORAL | 2 refills | Status: DC

## 2020-09-17 NOTE — Progress Notes (Signed)
Patient referred by No ref. provider found for hypertension  Subjective:   Jerry Horton, male    DOB: May 27, 1961, 59 y.o.   MRN: [id number]   Chief Complaint  Patient presents with  . Irregular Heart Beat  . Follow-up    HPI  59 y.o. African American male with hypertension, palpitations.   Patient has recently had worsening of his irregular heartbeat symptoms.  Seem to occur unrelated to exertion, last for several minutes.  He has been taking his diltiazem off and on.  A separate note, he endorses snoring at night, daytime sleepiness, as well as morning headaches.  Blood pressure mildly elevated today.   Current Outpatient Medications on File Prior to Visit  Medication Sig Dispense Refill  . albuterol (PROAIR HFA) 108 (90 Base) MCG/ACT inhaler Inhale 2 puffs into the lungs every 6 (six) hours as needed. 3 Inhaler 3  . cyclobenzaprine (FLEXERIL) 10 MG tablet Take 10 mg by mouth at bedtime.    Marland Kitchen diltiazem (DILT-XR) 120 MG 24 hr capsule Take 120 mg by mouth daily.    . Fluticasone-Salmeterol (ADVAIR) 100-50 MCG/DOSE AEPB TAKE 1 PUFF BY MOUTH TWICE A DAY 180 each 1  . sildenafil (VIAGRA) 100 MG tablet sildenafil 100 mg tablet  TAKE 1 TABLET BY MOUTH AS DIRECTED AS NEEDED    . Turmeric 400 MG CAPS Take by mouth.    . vitamin B-12 (CYANOCOBALAMIN) 500 MCG tablet Take 500 mcg by mouth daily.     No current facility-administered medications on file prior to visit.    Cardiovascular studies:  EKG 09/17/2020: Sinus rhythm 59 bpm Normal EKG   Echocardiogram 05/24/2019 :  Normal LV systolic function with EF 65%. Left ventricle cavity is normal in size. Mild concentric hypertrophy of the left ventricle. Normal global wall motion. Normal diastolic filling pattern. Calculated EF 65%. Structurally normal appearing tricuspid valve. Mild tricuspid regurgitation. No evidence of pulmonary hypertension. IVC is dilated with respiratory variation. This may suggest elevated right  heart pressure, how ever in the absence of other associated findings, may only represent normal variant.  EKG 04/12/2019: Sinus rhythm 71 bpm. Nonspecific T-abnormality.   Stress test 2014: Overall Impression:  Low risk stress nuclear study with mild inferior wall ischemia. and    LV Wall Motion:  NL LV Function; NL Wall Motion  Recent labs: 03/27/2019:  Glucose 92. BUN/Cr 12/1.18. eGFR 79. Na/K 139/5.3.  Total bilirubin 1.3 (mildly elevated) H/H 15/44. MCV 92. Platelets 220 Chol 189, TG 84, HDL 71, LDL 101.  TSH 2.8.  Review of Systems  Constitutional: Negative for decreased appetite, malaise/fatigue, weight gain and weight loss.  HENT: Negative for congestion.   Eyes: Negative for visual disturbance.  Cardiovascular: Positive for palpitations (Infrequent, short lasting). Negative for chest pain, dyspnea on exertion, leg swelling and syncope.  Respiratory: Negative for cough.   Endocrine: Negative for cold intolerance.  Hematologic/Lymphatic: Does not bruise/bleed easily.  Skin: Negative for itching and rash.  Musculoskeletal: Negative for myalgias.  Gastrointestinal: Negative for abdominal pain, nausea and vomiting.  Genitourinary: Negative for dysuria.  Neurological: Negative for dizziness and weakness.  Psychiatric/Behavioral: The patient is not nervous/anxious.   All other systems reviewed and are negative.        Vitals:   09/17/20 1032  BP: (!) 148/88  Pulse: 61  Resp: 16  SpO2: 100%     There is no height or weight on file to calculate BMI. Filed Weights   09/17/20 1032  Weight: 183 lb (  83 kg)    Objective:   Physical Exam Vitals and nursing note reviewed.  Constitutional:      General: He is not in acute distress. Neck:     Vascular: No JVD.  Cardiovascular:     Rate and Rhythm: Normal rate and regular rhythm.     Heart sounds: Normal heart sounds. No murmur heard.   Pulmonary:     Effort: Pulmonary effort is normal.     Breath sounds:  Normal breath sounds. No wheezing or rales.          Assessment & Recommendations:   59 y.o. African American male with hypertension, palpitations.   Palpitations: Episodes are longer than previous.  His family stopped atrial fibrillation.  Patient has personal history of suspected OSA.  Recommend cardiac telemetry to evaluate for A. fib or any other tachyarrhythmias.  Also recommend evaluation for OSA.  Continue diltiazem 100 mg daily for now.  Essential hypertension Blood pressure elevated today.  We will repeat at next visit.    Follow-up in 4 to 6 weeks.  [name], MD [facility] Pager: [phone] Office: [phone] If no answer Cell [phone]

## 2020-10-27 ENCOUNTER — Encounter: Payer: Self-pay | Admitting: Neurology

## 2020-10-27 ENCOUNTER — Other Ambulatory Visit: Payer: Self-pay

## 2020-10-27 ENCOUNTER — Ambulatory Visit: Payer: 59 | Admitting: Neurology

## 2020-10-27 VITALS — BP 133/80 | HR 77 | Ht 69.0 in | Wt 183.0 lb

## 2020-10-27 DIAGNOSIS — G478 Other sleep disorders: Secondary | ICD-10-CM | POA: Diagnosis not present

## 2020-10-27 DIAGNOSIS — I471 Supraventricular tachycardia: Secondary | ICD-10-CM

## 2020-10-27 DIAGNOSIS — R002 Palpitations: Secondary | ICD-10-CM | POA: Diagnosis not present

## 2020-10-27 DIAGNOSIS — R351 Nocturia: Secondary | ICD-10-CM

## 2020-10-27 DIAGNOSIS — Z820 Family history of epilepsy and other diseases of the nervous system: Secondary | ICD-10-CM

## 2020-10-27 DIAGNOSIS — J4520 Mild intermittent asthma, uncomplicated: Secondary | ICD-10-CM

## 2020-10-27 DIAGNOSIS — R0683 Snoring: Secondary | ICD-10-CM | POA: Diagnosis not present

## 2020-10-27 NOTE — Progress Notes (Signed)
Subjective:    Patient ID: Jerry Horton is a 60 y.o. male.  HPI     Star Age, MD, PhD [facility] [address], [address] P.O. [place],  [address]  Dear Dr. Virgina Jock,   I saw your patient, Jerry Horton, unknown kind request in my sleep clinic today for initial consultation on his sleep disorder, in particular, concern for underlying obstructive sleep apnea.  The patient is unaccompanied today.  As you know, [name] is a 60 year old right-handed gentleman with an underlying medical history of palpitations, mitral valve prolapse, pulmonary nodule, asthma, anxiety, allergic rhinitis and mildly overweight state, who reports snoring and excessive daytime somnolence.  I reviewed your office note from 09/17/2020.  His Epworth sleepiness score is 6 out of 24, fatigue severity score is 19 out of 63.  He is married and lives with his wife.  He works for [employer].  They have 2 daughters, age 41 and 12, the 71 year old daughter is currently staying with them, she is in grad school at [employer].  They have no pets in the household.  He works second shift.  He goes to work at 4 PM, either for 8 hours or 12 hours.  When he comes home at 4 AM, he tries to be in bed around 5 and would like to sleep till 11 or 11:30 AM.  Sometimes he is up by 930 or 10 AM.  He has nocturia about twice per average night, denies recurrent morning headaches but has had the occasional lightheadedness or dizziness in the morning.  He has 2 brothers with sleep apnea.  He has some difficulty falling asleep at times and occasionally takes melatonin.  He has a TV in the bedroom but does not keep it on while asleep.  He does not drink caffeine on a daily basis.  He is a non-smoker and does not drink alcohol. He recently completed a 2-week heart monitor which showed a few episodes of atrial tachycardia.  His Past Medical History Is Significant For: Past Medical History:  Diagnosis Date  .  Allergic rhinitis, cause unspecified   . Anxiety state, unspecified   . Asthma, moderate   . Epicondylitis, lateral, right CHRONIC  . History of echocardiogram 01/18/2007  . History of nuclear stress test 01/07/2011   negative   . History of nuclear stress test 05/09/2013   mild inferior wall ischemia, ST abnormalities   . Hypertension   . MVP (mitral valve prolapse) OCC . PALPITATIONS  . Palpitations OCCASIONAL  . Pulmonary nodule STABLE PER CT    His Past Surgical History Is Significant For: Past Surgical History:  Procedure Laterality Date  . OPEN RESECTION DISTAL RIGHT CLAVICLE AND [facility] JOINT ARTHRITIS  09-23-2010   DR Shellia Carwin  . REPAIR EXTENSOR TENDON  07/07/2012   Procedure: REPAIR EXTENSOR TENDON;  Surgeon: Magnus Sinning, MD;  Location: [facility];  Service: Orthopedics;  Laterality: Right;  REPAIR OF COMMON EXTENSOR TENDON WITH PARTIAL LATERAL EPICONDYLECTOMY OF THE RIGHT ELBOW   . RESECTION OF DISTAL RIGHT CLAVICLE & ACROMION  09/2010   [facility]  . SHOULDER ARTHROSCOPY  01/2007   [facility]--  RIGHT SHOULDER  . UMBILICAL HERNIA REPAIR  CHILD    His Family History Is Significant For: Family History  Problem Relation Age of Onset  . Hypertension Mother   . Cancer - Other Father        brain  . Irregular heart beat Brother   . Hypertension Brother   .  Hypertension Sister   . Hypertension Sister   . Irregular heart beat Sister   . Hypertension Brother   . Hypertension Brother   . Colon cancer Neg Hx     His Social History Is Significant For: Social History   Socioeconomic History  . Marital status: Married    Spouse name: Axtyn Woehler  . Number of children: 2  . Years of education: Not on file  . Highest education level: Not on file  Occupational History  . Occupation: [employer]  Tobacco Use  . Smoking status: Never Smoker  . Smokeless tobacco: Never Used  Vaping Use  . Vaping Use: Never used  Substance and Sexual Activity  .  Alcohol use: No  . Drug use: No  . Sexual activity: Not on file  Other Topics Concern  . Not on file  Social History Narrative  . Not on file   Social Determinants of Health   Financial Resource Strain: Not on file  Food Insecurity: Not on file  Transportation Needs: Not on file  Physical Activity: Not on file  Stress: Not on file  Social Connections: Not on file    His Allergies Are:  No Known Allergies:   His Current Medications Are:  Outpatient Encounter Medications as of 10/27/2020  Medication Sig  . albuterol (PROAIR HFA) 108 (90 Base) MCG/ACT inhaler Inhale 2 puffs into the lungs every 6 (six) hours as needed.  . cyclobenzaprine (FLEXERIL) 10 MG tablet Take 10 mg by mouth at bedtime.  Marland Kitchen diltiazem (DILT-XR) 120 MG 24 hr capsule Take 1 capsule (120 mg total) by mouth daily.  . Fluticasone-Salmeterol (ADVAIR) 100-50 MCG/DOSE AEPB TAKE 1 PUFF BY MOUTH TWICE A DAY  . HYDROcodone-acetaminophen (NORCO/VICODIN) 5-325 MG tablet Take 1 tablet by mouth as needed.  . naproxen (NAPROSYN) 500 MG tablet Take 500 mg by mouth 2 (two) times daily.  . Turmeric 400 MG CAPS Take by mouth.  . vitamin B-12 (CYANOCOBALAMIN) 500 MCG tablet Take 500 mcg by mouth daily.   No facility-administered encounter medications on file as of 10/27/2020.  :  Review of Systems:  Out of a complete 14 point review of systems, all are reviewed and negative with the exception of these symptoms as listed below: Review of Systems  Neurological:       Pt presents today to discuss his sleep. Pt has never had a sleep study but does endorse snoring.  Epworth Sleepiness Scale 0= would never doze 1= slight chance of dozing 2= moderate chance of dozing 3= high chance of dozing  Sitting and reading: 1 Watching TV: 1 Sitting inactive in a public place (ex. Theater or meeting): 1 As a passenger in a car for an hour without a break: 0 Lying down to rest in the afternoon: 2 Sitting and talking to someone: 0 Sitting  quietly after lunch (no alcohol): 1 In a car, while stopped in traffic: 0 Total: 6     Objective:  Neurological Exam  Physical Exam Physical Examination:   Vitals:   10/27/20 1317  BP: 133/80  Pulse: 77    General Examination: The patient is a very pleasant 60 y.o. male in no acute distress. He appears well-developed and well-nourished and well groomed.   HEENT: Normocephalic, atraumatic, pupils are equal, round and reactive to light, extraocular tracking is good without limitation to gaze excursion or nystagmus noted. Hearing is grossly intact. Face is symmetric with normal facial animation. Speech is clear with no dysarthria noted. There is no hypophonia.  There is no lip, neck/head, jaw or voice tremor. Neck is supple with full range of passive and active motion. There are no carotid bruits on auscultation. Oropharynx exam reveals: mild mouth dryness, adequate dental hygiene and mild airway crowding, due to small airway entry, smaller tonsils noted, slightly redundant soft palate and longer uvula noted, Mallampati class II, neck circumference of 16-1/2 inches.  He has a minimal overbite.  Tongue protrudes centrally and palate elevates symmetrically.  Chest: Clear to auscultation without wheezing, rhonchi or crackles noted.  Heart: S1+S2+0, regular and normal without murmurs, rubs or gallops noted.   Abdomen: Soft, non-tender and non-distended with normal bowel sounds appreciated on auscultation.  Extremities: There is no pitting edema in the distal lower extremities bilaterally.   Skin: Warm and dry without trophic changes noted.   Musculoskeletal: exam reveals no obvious joint deformities, tenderness or joint swelling or erythema.   Neurologically:  Mental status: The patient is awake, alert and oriented in all 4 spheres. His immediate and remote memory, attention, language skills and fund of knowledge are appropriate. There is no evidence of aphasia, agnosia, apraxia or anomia.  Speech is clear with normal prosody and enunciation. Thought process is linear. Mood is normal and affect is normal.  Cranial nerves II - XII are as described above under HEENT exam.  Motor exam: Normal bulk, strength and tone is noted. There is no tremor, Romberg is negative. Fine motor skills and coordination: grossly intact.  Cerebellar testing: No dysmetria or intention tremor. There is no truncal or gait ataxia.  Sensory exam: intact to light touch in the upper and lower extremities.  Gait, station and balance: He stands easily. No veering to one side is noted. No leaning to one side is noted. Posture is age-appropriate and stance is narrow based. Gait shows normal stride length and normal pace. No problems turning are noted. Tandem walk is unremarkable.                Assessment and Plan:  In summary, ARVILLE HEXT is a very pleasant 60 y.o.-year old male with an underlying medical history of palpitations, mitral valve prolapse, pulmonary nodule, asthma, anxiety, allergic rhinitis and mildly overweight state, whose history and physical exam are concerning for obstructive sleep apnea (OSA). I had a long chat with the patient about my findings and the diagnosis of OSA, its prognosis and treatment options. We talked about medical treatments, surgical interventions and non-pharmacological approaches. I explained in particular the risks and ramifications of untreated moderate to severe OSA, especially with respect to developing cardiovascular disease down the Road, including congestive heart failure, difficult to treat hypertension, cardiac arrhythmias, or stroke. Even type 2 diabetes has, in part, been linked to untreated OSA. Symptoms of untreated OSA include daytime sleepiness, memory problems, mood irritability and mood disorder such as depression and anxiety, lack of energy, as well as recurrent headaches, especially morning headaches. We talked about trying to maintain a healthy lifestyle in  general, as well as the importance of weight control. We also talked about the importance of good sleep hygiene. I recommended the following at this time: sleep study.  I explained the sleep test procedure to the patient and also outlined possible surgical and non-surgical treatment options of OSA, including the use of a custom-made dental device (which would require a referral to a specialist dentist or oral surgeon), upper airway surgical options, such as traditional UPPP or a novel less invasive surgical option in the form of Inspire hypoglossal  nerve stimulation (which would involve a referral to an ENT surgeon). I also explained the CPAP treatment option to the patient, who indicated that he would be willing to try CPAP if the need arises. I explained the importance of being compliant with PAP treatment, not only for insurance purposes but primarily to improve His symptoms, and for the patient's long term health benefit, including to reduce His cardiovascular risks. I answered all his questions today and the patient was in agreement. I plan to see him back after the sleep study is completed and encouraged him to call with any interim questions, concerns, problems or updates.   Thank you very much for allowing me to participate in the care of this nice patient. If I can be of any further assistance to you please do not hesitate to call me at [phone].  Sincerely,   Star Age, MD, PhD

## 2020-10-27 NOTE — Patient Instructions (Signed)

## 2020-10-31 ENCOUNTER — Other Ambulatory Visit: Payer: Self-pay

## 2020-10-31 MED ORDER — METOPROLOL TARTRATE 25 MG PO TABS: 25.0000 mg | ORAL_TABLET | Freq: Two times a day (BID) | ORAL | 3 refills | Status: DC

## 2020-10-31 NOTE — Progress Notes (Signed)
Called and spoke with patient regarding monitor results. Pt voiced understanding and agreed to start metoprolol tartrate. AD

## 2020-12-01 ENCOUNTER — Ambulatory Visit (INDEPENDENT_AMBULATORY_CARE_PROVIDER_SITE_OTHER): Payer: 59 | Admitting: Neurology

## 2020-12-01 DIAGNOSIS — R351 Nocturia: Secondary | ICD-10-CM

## 2020-12-01 DIAGNOSIS — G478 Other sleep disorders: Secondary | ICD-10-CM

## 2020-12-01 DIAGNOSIS — G4710 Hypersomnia, unspecified: Secondary | ICD-10-CM

## 2020-12-01 DIAGNOSIS — R0683 Snoring: Secondary | ICD-10-CM

## 2020-12-01 DIAGNOSIS — J4520 Mild intermittent asthma, uncomplicated: Secondary | ICD-10-CM

## 2020-12-01 DIAGNOSIS — I471 Supraventricular tachycardia: Secondary | ICD-10-CM

## 2020-12-01 DIAGNOSIS — Z820 Family history of epilepsy and other diseases of the nervous system: Secondary | ICD-10-CM

## 2020-12-01 DIAGNOSIS — R002 Palpitations: Secondary | ICD-10-CM

## 2020-12-09 ENCOUNTER — Other Ambulatory Visit: Payer: Self-pay | Admitting: Cardiology

## 2020-12-09 DIAGNOSIS — R002 Palpitations: Secondary | ICD-10-CM

## 2020-12-12 ENCOUNTER — Ambulatory Visit: Payer: 59 | Admitting: Cardiology

## 2020-12-12 ENCOUNTER — Other Ambulatory Visit: Payer: Self-pay

## 2020-12-12 ENCOUNTER — Encounter: Payer: Self-pay | Admitting: Cardiology

## 2020-12-12 VITALS — BP 142/79 | HR 67 | Temp 97.8°F | Resp 16 | Ht 69.0 in | Wt 188.0 lb

## 2020-12-12 DIAGNOSIS — I472 Ventricular tachycardia: Secondary | ICD-10-CM

## 2020-12-12 DIAGNOSIS — R002 Palpitations: Secondary | ICD-10-CM

## 2020-12-12 DIAGNOSIS — I471 Supraventricular tachycardia: Secondary | ICD-10-CM | POA: Insufficient documentation

## 2020-12-12 DIAGNOSIS — I4729 Other ventricular tachycardia: Secondary | ICD-10-CM

## 2020-12-12 DIAGNOSIS — I493 Ventricular premature depolarization: Secondary | ICD-10-CM | POA: Insufficient documentation

## 2020-12-12 DIAGNOSIS — I491 Atrial premature depolarization: Secondary | ICD-10-CM | POA: Insufficient documentation

## 2020-12-12 NOTE — Progress Notes (Signed)
Patient referred by No ref. provider found for hypertension  Subjective:   Jerry Horton, male    DOB: 21-Nov-1960, 60 y.o.   MRN: [id number]   Chief Complaint  Patient presents with  . Palpitations  . Follow-up    6 weeks    60 y.o. African American male with hypertension, palpitations.   Palpitations symptoms are improved with diltiazem 120 mg daily. Sleep study result is pending. He had one episode of lightheadedness while walking to grocery store, associating blurry vision, lasted for few seconds. He did not lose consciousness.   Current Outpatient Medications on File Prior to Visit  Medication Sig Dispense Refill  . albuterol (PROAIR HFA) 108 (90 Base) MCG/ACT inhaler Inhale 2 puffs into the lungs every 6 (six) hours as needed. 3 Inhaler 3  . cyclobenzaprine (FLEXERIL) 10 MG tablet Take 10 mg by mouth at bedtime.    Marland Kitchen DILT-XR 120 MG 24 hr capsule TAKE 1 CAPSULE BY MOUTH EVERY DAY 90 capsule 2  . Fluticasone-Salmeterol (ADVAIR) 100-50 MCG/DOSE AEPB TAKE 1 PUFF BY MOUTH TWICE A DAY 180 each 1  . HYDROcodone-acetaminophen (NORCO/VICODIN) 5-325 MG tablet Take 1 tablet by mouth as needed.    . vitamin B-12 (CYANOCOBALAMIN) 500 MCG tablet Take 500 mcg by mouth daily.    . metoprolol tartrate (LOPRESSOR) 25 MG tablet Take 1 tablet (25 mg total) by mouth 2 (two) times daily. 180 tablet 3   No current facility-administered medications on file prior to visit.    Cardiovascular studies:  Mobile cardiac telemetry 13 days 09/17/2020 - 10/01/2020: Dominant rhythm: Sinus. HR 46-167 bpm. Avg HR 74 bpm, in sinus rhythm. 11 episodes of atrial tachycardia/SVT, fastest at 218 bpm for 6 beats, longest for 9 beats at 142 bpm. <1% isolated SVE, couplet/triplets. 2 episodes of VT, fastest at 261 bpm for 5 beats, longest for 6 beats at 175 bpm. <1% isolated VE, couplet. No triplets. No atrial fibrillation/atrial flutter/high grade AV block, sinus pause >3sec noted. 6 patient triggered  events, correlate with ventricular ectopy/atrial tachycardia.    EKG 09/17/2020: Sinus rhythm 59 bpm Normal EKG   Echocardiogram 05/24/2019 :  Normal LV systolic function with EF 65%. Left ventricle cavity is normal in size. Mild concentric hypertrophy of the left ventricle. Normal global wall motion. Normal diastolic filling pattern. Calculated EF 65%. Structurally normal appearing tricuspid valve. Mild tricuspid regurgitation. No evidence of pulmonary hypertension. IVC is dilated with respiratory variation. This may suggest elevated right heart pressure, how ever in the absence of other associated findings, may only represent normal variant.  EKG 04/12/2019: Sinus rhythm 71 bpm. Nonspecific T-abnormality.   Stress test 2014: Overall Impression:  Low risk stress nuclear study with mild inferior wall ischemia. and    LV Wall Motion:  NL LV Function; NL Wall Motion  Recent labs: 03/27/2019:  Glucose 92. BUN/Cr 12/1.18. eGFR 79. Na/K 139/5.3.  Total bilirubin 1.3 (mildly elevated) H/H 15/44. MCV 92. Platelets 220 Chol 189, TG 84, HDL 71, LDL 101.  TSH 2.8.  Review of Systems  Cardiovascular: Positive for near-syncope. Negative for chest pain, dyspnea on exertion, leg swelling, palpitations and syncope.         Vitals:   12/12/20 1316  BP: (!) 142/79  Pulse: 67  Resp: 16  Temp: 97.8 F (36.6 C)  SpO2: 98%     Body mass index is 27.76 kg/m. Filed Weights   12/12/20 1316  Weight: 188 lb (85.3 kg)    Objective:   Physical  Exam Vitals and nursing note reviewed.  Constitutional:      General: He is not in acute distress. Neck:     Vascular: No JVD.  Cardiovascular:     Rate and Rhythm: Normal rate and regular rhythm.     Heart sounds: Normal heart sounds. No murmur heard.   Pulmonary:     Effort: Pulmonary effort is normal.     Breath sounds: Normal breath sounds. No wheezing or rales.  Musculoskeletal:     Right lower leg: No edema.     Left lower leg:  No edema.          Assessment & Recommendations:   60 y.o. African American male with hypertension, palpitations.   Palpitations: Symptomatic PAC/PVC, occasional atrial tachycardia, NSVT Will obtain exercise treadmill stress test Continue diltiazem 2120 mg daily  Essential hypertension Fairly well controlled  F/u in 3 months  [place], MD [facility] Pager: [phone] Office: [phone] If no answer Cell [phone]

## 2020-12-18 ENCOUNTER — Telehealth: Payer: Self-pay

## 2020-12-18 NOTE — Telephone Encounter (Signed)
-----   Message from Star Age, MD sent at 12/18/2020  1:26 PM EST ----- Patient referred by Dr. Virgina Jock, seen by me on 10/27/20, HST on 12/01/20.   Please call and notify the patient that the recent home sleep test did not show any significant obstructive sleep apnea. Snoring was noted and appeared to be in the mild to moderate range.  Overall AHI was 3.3/h, O2 nadir briefly at 87% with time below or at 88% saturation of less than 1 minute.  At this juncture, he can follow up with the referring provider.  Thanks,  Star Age, MD, PhD [facility])

## 2020-12-18 NOTE — Telephone Encounter (Signed)
I called the pt and advised of results and he verbalized understanding. Report has been fwd to Dr. Virgina Jock.

## 2020-12-18 NOTE — Progress Notes (Signed)
Patient referred by Dr. Virgina Jock, seen by me on 10/27/20, HST on 12/01/20.   Please call and notify the patient that the recent home sleep test did not show any significant obstructive sleep apnea. Snoring was noted and appeared to be in the mild to moderate range.  Overall AHI was 3.3/h, O2 nadir briefly at 87% with time below or at 88% saturation of less than 1 minute.  At this juncture, he can follow up with the referring provider.  Thanks,  Star Age, MD, PhD [facility])

## 2020-12-18 NOTE — Procedures (Signed)
   [facility] TEST (Watch PAT)  STUDY DATE: 12/01/20  DOB: [birth year]  MRN: [id number]  ORDERING CLINICIAN: Star Age, MD, PhD   REFERRING CLINICIAN: Dr. Virgina Jock  CLINICAL INFORMATION/HISTORY: 60 year old man with a history of palpitations, mitral valve prolapse, pulmonary nodule, asthma, anxiety, allergic rhinitis and mildly overweight state, who reports snoring and excessive daytime somnolence.  Epworth sleepiness score: 6/24.  BMI: 27.1 kg/m  Neck Circumference: 16.5 "  FINDINGS:   Total Record Time (hours, min): 7 H 38 min  Total Sleep Time (hours, min):  5 H 28 min   Percent REM (%):    18/44 %   Calculated pAHI (per hour): 3.3       REM pAHI: 9.4    NREM pAHI: 2.4 Supine AHI: 2.0   Oxygen Saturation (%) Mean: 94  Minimum oxygen saturation (%):         87   O2 Saturation Range (%): 87-94  O2Saturation (minutes) <=88%: 0 min  Pulse Mean (bpm):    94  Pulse Range (87-98)   IMPRESSION: Primary snoring   RECOMMENDATION:  This home sleep test does not demonstrate any significant obstructive or central sleep disordered breathing. Snoring was noted and appeared to be in the mild to moderate range.  Overall AHI was 3.3/h, O2 nadir briefly at 87% with time below or at 88% saturation of less than 1 minute. Other causes of the patient's symptoms, including circadian rhythm disturbances, an underlying mood disorder, medication effect and/or an underlying medical problem cannot be ruled out based on this test. Clinical correlation is recommended. The patient should be cautioned not to drive, work at heights, or operate dangerous or heavy equipment when tired or sleepy. Review and reiteration of good sleep hygiene measures should be pursued with any patient. The patient can follow up with his referring provider, who will be notified of the test results.   I certify that I have reviewed the raw data recording prior to the issuance of this report in  accordance with the standards of the American Academy of Sleep Medicine (AASM).  INTERPRETING PHYSICIAN:   Star Age, MD, PhD  Board Certified in Neurology and Sleep Medicine  [facility] [address] [place], [place] [address] [phone]

## 2020-12-19 ENCOUNTER — Other Ambulatory Visit (HOSPITAL_COMMUNITY)
Admission: RE | Admit: 2020-12-19 | Discharge: 2020-12-19 | Disposition: A | Discharge status: Home / Self Care | Payer: 59 | Source: Ambulatory Visit | Attending: Cardiology | Admitting: Cardiology

## 2020-12-19 DIAGNOSIS — Z01812 Encounter for preprocedural laboratory examination: Secondary | ICD-10-CM | POA: Diagnosis present

## 2020-12-19 DIAGNOSIS — Z20822 Contact with and (suspected) exposure to covid-19: Secondary | ICD-10-CM | POA: Insufficient documentation

## 2020-12-19 LAB — SARS CORONAVIRUS 2 (TAT 6-24 HRS): SARS Coronavirus 2: NEGATIVE

## 2020-12-22 ENCOUNTER — Other Ambulatory Visit: Payer: Self-pay

## 2020-12-22 ENCOUNTER — Ambulatory Visit: Payer: 59

## 2020-12-22 DIAGNOSIS — R002 Palpitations: Secondary | ICD-10-CM

## 2021-03-05 ENCOUNTER — Ambulatory Visit (INDEPENDENT_AMBULATORY_CARE_PROVIDER_SITE_OTHER): Payer: 59

## 2021-03-05 ENCOUNTER — Ambulatory Visit: Payer: 59 | Admitting: Podiatry

## 2021-03-05 ENCOUNTER — Other Ambulatory Visit: Payer: Self-pay

## 2021-03-05 DIAGNOSIS — M722 Plantar fascial fibromatosis: Secondary | ICD-10-CM

## 2021-03-05 MED ORDER — TRIAMCINOLONE ACETONIDE 10 MG/ML IJ SUSP
20.0000 mg | Freq: Once | INTRAMUSCULAR | Status: AC
  Administered 2021-03-05: 20 mg

## 2021-03-05 NOTE — Patient Instructions (Signed)

## 2021-03-06 NOTE — Progress Notes (Signed)
Subjective:   Patient ID: Jerry Horton, male   DOB: 60 y.o.   MRN: [id number]   HPI Patient presents stating that he has developed increased pain in the arch of both feet and states its been going on for around a year.  Patient does work on [employer] and tries also to be active and states it is causing more problems for him.  Patient does not smoke   Review of Systems  All other systems reviewed and are negative.       Objective:  Physical Exam Vitals and nursing note reviewed.  Constitutional:      Appearance: He is well-developed.  Pulmonary:     Effort: Pulmonary effort is normal.  Musculoskeletal:        General: Normal range of motion.  Skin:    General: Skin is warm.  Neurological:     Mental Status: He is alert.     Neurovascular status found to be intact muscle strength was found to be adequate range of motion adequate moderate depression of the arch noted bilateral with distal inflammation of the plantar fascia within the plantar fascial itself with fluid buildup pain with palpation.  Patient is found to have good digital perfusion well oriented x3 mild equinus condition noted bilateral     Assessment:  Acute Planter fasciitis of the distal portion of the plantar fascial bilateral painful with pressure     Plan:  H&P x-rays reviewed condition discussed.  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

## 2021-03-11 ENCOUNTER — Ambulatory Visit: Payer: 59 | Admitting: Cardiology

## 2021-03-11 ENCOUNTER — Encounter: Payer: Self-pay | Admitting: Cardiology

## 2021-03-11 ENCOUNTER — Other Ambulatory Visit: Payer: Self-pay

## 2021-03-11 VITALS — BP 158/79 | HR 71 | Temp 98.7°F | Resp 16 | Ht 69.0 in | Wt 178.0 lb

## 2021-03-11 DIAGNOSIS — I491 Atrial premature depolarization: Secondary | ICD-10-CM

## 2021-03-11 DIAGNOSIS — I493 Ventricular premature depolarization: Secondary | ICD-10-CM

## 2021-03-11 DIAGNOSIS — I471 Supraventricular tachycardia: Secondary | ICD-10-CM

## 2021-03-11 NOTE — Progress Notes (Signed)
Patient referred by No ref. provider found for hypertension  Subjective:   Jerry Horton, male    DOB: 1961/10/22, 60 y.o.   MRN: [id number]   Chief Complaint  Patient presents with  . Palpitations  . Follow-up    3 month    60 y.o. African American male with palpitations  Patient is doing well. Palpitations are far and few in between. No syncope episode. Blood pressure is usually better controlled at home.   Current Outpatient Medications on File Prior to Visit  Medication Sig Dispense Refill  . albuterol (PROAIR HFA) 108 (90 Base) MCG/ACT inhaler Inhale 2 puffs into the lungs every 6 (six) hours as needed. 3 Inhaler 3  . cyclobenzaprine (FLEXERIL) 10 MG tablet Take 10 mg by mouth at bedtime.    Marland Kitchen DILT-XR 120 MG 24 hr capsule TAKE 1 CAPSULE BY MOUTH EVERY DAY 90 capsule 2  . Fluticasone-Salmeterol (ADVAIR) 100-50 MCG/DOSE AEPB TAKE 1 PUFF BY MOUTH TWICE A DAY 180 each 1  . HYDROcodone-acetaminophen (NORCO/VICODIN) 5-325 MG tablet Take 1 tablet by mouth as needed.    . sildenafil (VIAGRA) 100 MG tablet Take 100 mg by mouth as directed.    . vitamin B-12 (CYANOCOBALAMIN) 500 MCG tablet Take 500 mcg by mouth daily.     No current facility-administered medications on file prior to visit.    Cardiovascular studies:  Exercise treadmill stress test 12/22/2020: Exercise treadmill stress test performed using Bruce protocol.  Patient reached 12.3 METS, and 99% of age predicted maximum heart rate.  Exercise capacity was excellent.  No chest pain reported.  Normal heart rate and hemodynamic response. Stress EKG revealed no ischemic changes. Low risk study.   Mobile cardiac telemetry 13 days 09/17/2020 - 10/01/2020: Dominant rhythm: Sinus. HR 46-167 bpm. Avg HR 74 bpm, in sinus rhythm. 11 episodes of atrial tachycardia/SVT, fastest at 218 bpm for 6 beats, longest for 9 beats at 142 bpm. <1% isolated SVE, couplet/triplets. 2 episodes of VT, fastest at 261 bpm for 5 beats, longest  for 6 beats at 175 bpm. <1% isolated VE, couplet. No triplets. No atrial fibrillation/atrial flutter/high grade AV block, sinus pause >3sec noted. 6 patient triggered events, correlate with ventricular ectopy/atrial tachycardia.    EKG 09/17/2020: Sinus rhythm 59 bpm Normal EKG   Echocardiogram 05/24/2019 :  Normal LV systolic function with EF 65%. Left ventricle cavity is normal in size. Mild concentric hypertrophy of the left ventricle. Normal global wall motion. Normal diastolic filling pattern. Calculated EF 65%. Structurally normal appearing tricuspid valve. Mild tricuspid regurgitation. No evidence of pulmonary hypertension. IVC is dilated with respiratory variation. This may suggest elevated right heart pressure, how ever in the absence of other associated findings, may only represent normal variant.  EKG 04/12/2019: Sinus rhythm 71 bpm. Nonspecific T-abnormality.   Stress test 2014: Overall Impression:  Low risk stress nuclear study with mild inferior wall ischemia. and    LV Wall Motion:  NL LV Function; NL Wall Motion  Recent labs: 03/27/2019:  Glucose 92. BUN/Cr 12/1.18. eGFR 79. Na/K 139/5.3.  Total bilirubin 1.3 (mildly elevated) H/H 15/44. MCV 92. Platelets 220 Chol 189, TG 84, HDL 71, LDL 101.  TSH 2.8.  Review of Systems  Cardiovascular: Positive for near-syncope. Negative for chest pain, dyspnea on exertion, leg swelling, palpitations and syncope.         Vitals:   03/11/21 1303  BP: (!) 158/79  Pulse: 71  Resp: 16  Temp: 98.7 F (37.1 C)  SpO2:  98%     Body mass index is 26.29 kg/m. Filed Weights   03/11/21 1303  Weight: 178 lb (80.7 kg)    Objective:   Physical Exam Vitals and nursing note reviewed.  Constitutional:      General: He is not in acute distress. Neck:     Vascular: No JVD.  Cardiovascular:     Rate and Rhythm: Normal rate and regular rhythm.     Heart sounds: Normal heart sounds. No murmur heard.   Pulmonary:      Effort: Pulmonary effort is normal.     Breath sounds: Normal breath sounds. No wheezing or rales.  Musculoskeletal:     Right lower leg: No edema.     Left lower leg: No edema.          Assessment & Recommendations:   60 y.o. African American male with hypertension, palpitations.   Palpitations: Symptomatic PAC/PVC, occasional atrial tachycardia, NSVT Reassuring treadmill stress test. Continue diltiazem 120 mg daily  Essential hypertension BP elevated today,. Generally well controlled. I encouraged him to update me through MyChart re: his blood pressure readings in a few weeks.  If SBP consistently >140 mmHg, will increase diltiazem to 240 mg daily.   F/u in 6 months  Aditya Nastasi Esther Hardy, MD [facility] Pager: [phone] Office: [phone] If no answer Cell [phone]

## 2021-03-25 ENCOUNTER — Other Ambulatory Visit: Payer: 59

## 2021-03-25 ENCOUNTER — Other Ambulatory Visit: Payer: Self-pay

## 2021-03-25 ENCOUNTER — Telehealth: Payer: Self-pay | Admitting: *Deleted

## 2021-03-25 NOTE — Telephone Encounter (Signed)
Patient has picked up [employer] today -03/25/2021. Dispensed by [facility]

## 2021-09-08 ENCOUNTER — Other Ambulatory Visit: Payer: Self-pay | Admitting: Otolaryngology

## 2021-09-08 DIAGNOSIS — H9319 Tinnitus, unspecified ear: Secondary | ICD-10-CM

## 2021-09-11 ENCOUNTER — Other Ambulatory Visit: Payer: Self-pay

## 2021-09-11 ENCOUNTER — Encounter: Payer: Self-pay | Admitting: Cardiology

## 2021-09-11 ENCOUNTER — Ambulatory Visit: Payer: 59 | Admitting: Cardiology

## 2021-09-11 VITALS — BP 147/88 | HR 69 | Temp 98.0°F | Resp 16 | Ht 69.0 in | Wt 180.0 lb

## 2021-09-11 DIAGNOSIS — R002 Palpitations: Secondary | ICD-10-CM

## 2021-09-11 DIAGNOSIS — I10 Essential (primary) hypertension: Secondary | ICD-10-CM

## 2021-09-11 DIAGNOSIS — I491 Atrial premature depolarization: Secondary | ICD-10-CM

## 2021-09-11 MED ORDER — DILTIAZEM HCL ER 120 MG PO CP24: ORAL_CAPSULE | ORAL | 3 refills | Status: DC

## 2021-09-11 NOTE — Progress Notes (Signed)
Patient referred by No ref. provider found for hypertension  Subjective:   Jerry Horton, male    DOB: 03/17/1961, 60 y.o.   MRN: [id number]   Chief Complaint  Patient presents with   Hypertension   Palpitations   Follow-up    6 months    60 y.o. African American male with palpitations  Patient is doing well. He has inly occasional palpitations. Blood pressure elevated today, but usually well controlled at home, per the patient.   Current Outpatient Medications on File Prior to Visit  Medication Sig Dispense Refill   albuterol (PROAIR HFA) 108 (90 Base) MCG/ACT inhaler Inhale 2 puffs into the lungs every 6 (six) hours as needed. 3 Inhaler 3   cyclobenzaprine (FLEXERIL) 10 MG tablet Take 10 mg by mouth at bedtime.     DILT-XR 120 MG 24 hr capsule TAKE 1 CAPSULE BY MOUTH EVERY DAY 90 capsule 2   Fluticasone-Salmeterol (ADVAIR) 100-50 MCG/DOSE AEPB TAKE 1 PUFF BY MOUTH TWICE A DAY 180 each 1   HYDROcodone-acetaminophen (NORCO/VICODIN) 5-325 MG tablet Take 1 tablet by mouth as needed.     sildenafil (VIAGRA) 100 MG tablet Take 100 mg by mouth as directed.     vitamin B-12 (CYANOCOBALAMIN) 500 MCG tablet Take 500 mcg by mouth daily.     No current facility-administered medications on file prior to visit.    Cardiovascular studies:  EKG 09/11/2021: Sinus rhythm 75 bpm  Nonspecific T-abnormality  Exercise treadmill stress test 12/22/2020: Exercise treadmill stress test performed using Bruce protocol.  Patient reached 12.3 METS, and 99% of age predicted maximum heart rate.  Exercise capacity was excellent.  No chest pain reported.  Normal heart rate and hemodynamic response. Stress EKG revealed no ischemic changes. Low risk study.   Mobile cardiac telemetry 13 days 09/17/2020 - 10/01/2020: Dominant rhythm: Sinus. HR 46-167 bpm. Avg HR 74 bpm, in sinus rhythm. 11 episodes of atrial tachycardia/SVT, fastest at 218 bpm for 6 beats, longest for 9 beats at 142 bpm. <1%  isolated SVE, couplet/triplets. 2 episodes of VT, fastest at 261 bpm for 5 beats, longest for 6 beats at 175 bpm. <1% isolated VE, couplet. No triplets. No atrial fibrillation/atrial flutter/high grade AV block, sinus pause >3sec noted. 6 patient triggered events, correlate with ventricular ectopy/atrial tachycardia.   Echocardiogram 05/24/2019 :  Normal LV systolic function with EF 65%. Left ventricle cavity is normal in size. Mild concentric hypertrophy of the left ventricle. Normal global wall motion. Normal diastolic filling pattern. Calculated EF 65%. Structurally normal appearing tricuspid valve. Mild tricuspid regurgitation. No evidence of pulmonary hypertension. IVC is dilated with respiratory variation. This may suggest elevated right heart pressure, how ever in the absence of other associated findings, may only represent normal variant.  EKG 04/12/2019: Sinus rhythm 71 bpm. Nonspecific T-abnormality.   Stress test 2014: Overall Impression:  Low risk stress nuclear study with mild inferior wall ischemia. and     LV Wall Motion:  NL LV Function; NL Wall Motion  Recent labs: 03/27/2019:  Glucose 92. BUN/Cr 12/1.18. eGFR 79. Na/K 139/5.3.  Total bilirubin 1.3 (mildly elevated) H/H 15/44. MCV 92. Platelets 220 Chol 189, TG 84, HDL 71, LDL 101.  TSH 2.8.  Review of Systems  Cardiovascular:  Positive for palpitations (Only occasional). Negative for chest pain, dyspnea on exertion, leg swelling, near-syncope and syncope.        Vitals:   09/11/21 1315  BP: (!) 147/88  Pulse: 69  Resp: 16  Temp: 98  F (36.7 C)  SpO2: 99%      Body mass index is 26.58 kg/m. Filed Weights   09/11/21 1315  Weight: 180 lb (81.6 kg)    Objective:   Physical Exam Vitals and nursing note reviewed.  Constitutional:      General: He is not in acute distress. Neck:     Vascular: No JVD.  Cardiovascular:     Rate and Rhythm: Normal rate and regular rhythm.     Heart sounds: Normal  heart sounds. No murmur heard. Pulmonary:     Effort: Pulmonary effort is normal.     Breath sounds: Normal breath sounds. No wheezing or rales.  Musculoskeletal:     Right lower leg: No edema.     Left lower leg: No edema.         Assessment & Recommendations:   60 y.o. African American male with hypertension, palpitations.   Palpitations: Symptomatic PAC/PVC, occasional atrial tachycardia, NSVT Reassuring treadmill stress test. Continue diltiazem 120 mg daily  Essential hypertension BP elevated today. Generally well controlled. He does not want to make a change to his medications today. Continue diltiazem 120 mg daily.   F/u in 1 year  Jerry Mormon, MD [facility] Pager: [phone] Office: [phone] If no answer Cell [phone]

## 2021-09-12 ENCOUNTER — Encounter: Payer: Self-pay | Admitting: Cardiology

## 2021-10-04 ENCOUNTER — Other Ambulatory Visit: Payer: Self-pay

## 2021-10-04 ENCOUNTER — Ambulatory Visit
Admission: RE | Admit: 2021-10-04 | Discharge: 2021-10-04 | Disposition: A | Discharge status: Home / Self Care | Payer: 59 | Source: Ambulatory Visit | Attending: Otolaryngology | Admitting: Otolaryngology

## 2021-10-04 DIAGNOSIS — H9319 Tinnitus, unspecified ear: Secondary | ICD-10-CM

## 2021-10-04 MED ORDER — GADOBENATE DIMEGLUMINE 529 MG/ML IV SOLN
17.0000 mL | Freq: Once | INTRAVENOUS | Status: AC | PRN
  Administered 2021-10-04: 17 mL via INTRAVENOUS

## 2022-01-08 ENCOUNTER — Other Ambulatory Visit: Payer: Self-pay | Admitting: Orthopedic Surgery

## 2022-01-08 DIAGNOSIS — M48062 Spinal stenosis, lumbar region with neurogenic claudication: Secondary | ICD-10-CM

## 2022-01-23 ENCOUNTER — Ambulatory Visit
Admission: RE | Admit: 2022-01-23 | Discharge: 2022-01-23 | Disposition: A | Discharge status: Home / Self Care | Payer: 59 | Source: Ambulatory Visit | Attending: Orthopedic Surgery | Admitting: Orthopedic Surgery

## 2022-01-23 DIAGNOSIS — M48062 Spinal stenosis, lumbar region with neurogenic claudication: Secondary | ICD-10-CM

## 2022-05-03 ENCOUNTER — Other Ambulatory Visit: Payer: Self-pay | Admitting: *Deleted

## 2022-05-03 DIAGNOSIS — M546 Pain in thoracic spine: Secondary | ICD-10-CM

## 2022-05-08 ENCOUNTER — Ambulatory Visit
Admission: RE | Admit: 2022-05-08 | Discharge: 2022-05-08 | Disposition: A | Discharge status: Home / Self Care | Payer: 59 | Source: Ambulatory Visit | Attending: *Deleted | Admitting: *Deleted

## 2022-05-08 DIAGNOSIS — M546 Pain in thoracic spine: Secondary | ICD-10-CM

## 2022-08-31 ENCOUNTER — Other Ambulatory Visit: Payer: Self-pay | Admitting: Cardiology

## 2022-08-31 DIAGNOSIS — I491 Atrial premature depolarization: Secondary | ICD-10-CM

## 2022-08-31 DIAGNOSIS — I10 Essential (primary) hypertension: Secondary | ICD-10-CM

## 2022-09-13 ENCOUNTER — Ambulatory Visit: Payer: 59 | Admitting: Cardiology

## 2022-09-13 ENCOUNTER — Encounter: Payer: Self-pay | Admitting: Cardiology

## 2022-09-13 ENCOUNTER — Ambulatory Visit: Payer: 59

## 2022-09-13 VITALS — BP 121/66 | HR 82 | Resp 16 | Ht 69.0 in | Wt 190.0 lb

## 2022-09-13 DIAGNOSIS — I491 Atrial premature depolarization: Secondary | ICD-10-CM

## 2022-09-13 DIAGNOSIS — I4719 Other supraventricular tachycardia: Secondary | ICD-10-CM

## 2022-09-13 DIAGNOSIS — I4729 Other ventricular tachycardia: Secondary | ICD-10-CM

## 2022-09-13 DIAGNOSIS — I10 Essential (primary) hypertension: Secondary | ICD-10-CM

## 2022-09-13 NOTE — Progress Notes (Signed)
Patient referred by Jerry Beals, NP for hypertension  Subjective:   Jerry Horton, male    DOB: 05-15-1961, 61 y.o.   MRN: [id number]   Chief Complaint  Patient presents with   Atrial Fibrillation   Follow-up    1 year   Palpitations    61 y.o. African American male with palpitations  Patient reports "hearing" his heart beats when lays on his side. No other symptoms reported. He winders if he could have Afub,    Current Outpatient Medications:    albuterol (PROAIR HFA) 108 (90 Base) MCG/ACT inhaler, Inhale 2 puffs into the lungs every 6 (six) hours as needed., Disp: 3 Inhaler, Rfl: 3   Coenzyme Q10 (COQ10) 200 MG CAPS, Take by mouth., Disp: , Rfl:    cyclobenzaprine (FLEXERIL) 10 MG tablet, Take 10 mg by mouth at bedtime., Disp: , Rfl:    diltiazem (DILT-XR) 120 MG 24 hr capsule, TAKE 1 CAPSULE BY MOUTH EVERY DAY, Disp: 90 capsule, Rfl: 3   Flaxseed, Linseed, (FLAXSEED OIL) 1000 MG CAPS, Take by mouth., Disp: , Rfl:    Fluticasone-Salmeterol (ADVAIR) 100-50 MCG/DOSE AEPB, TAKE 1 PUFF BY MOUTH TWICE A DAY, Disp: 180 each, Rfl: 1   HYDROcodone-acetaminophen (NORCO/VICODIN) 5-325 MG tablet, Take 1 tablet by mouth as needed., Disp: , Rfl:    Omega-3 Fatty Acids (FISH OIL) 1000 MG CAPS, Take by mouth., Disp: , Rfl:    sildenafil (VIAGRA) 100 MG tablet, Take 100 mg by mouth as directed., Disp: , Rfl:    Turmeric (QC TUMERIC COMPLEX PO), Take by mouth., Disp: , Rfl:    vitamin B-12 (CYANOCOBALAMIN) 500 MCG tablet, Take 500 mcg by mouth daily., Disp: , Rfl:   Cardiovascular studies:  EKG 09/13/2022: Sinus rhythm 80 bpm  Diffuse nonspecific T-abnormality   Exercise treadmill stress test 12/22/2020: Exercise treadmill stress test performed using Bruce protocol.  Patient reached 12.3 METS, and 99% of age predicted maximum heart rate.  Exercise capacity was excellent.  No chest pain reported.  Normal heart rate and hemodynamic response. Stress EKG revealed no ischemic  changes. Low risk study.   Mobile cardiac telemetry 13 days 09/17/2020 - 10/01/2020: Dominant rhythm: Sinus. HR 46-167 bpm. Avg HR 74 bpm, in sinus rhythm. 11 episodes of atrial tachycardia/SVT, fastest at 218 bpm for 6 beats, longest for 9 beats at 142 bpm. <1% isolated SVE, couplet/triplets. 2 episodes of VT, fastest at 261 bpm for 5 beats, longest for 6 beats at 175 bpm. <1% isolated VE, couplet. No triplets. No atrial fibrillation/atrial flutter/high grade AV block, sinus pause >3sec noted. 6 patient triggered events, correlate with ventricular ectopy/atrial tachycardia.   Echocardiogram 05/24/2019 :  Normal LV systolic function with EF 65%. Left ventricle cavity is normal in size. Mild concentric hypertrophy of the left ventricle. Normal global wall motion. Normal diastolic filling pattern. Calculated EF 65%. Structurally normal appearing tricuspid valve. Mild tricuspid regurgitation. No evidence of pulmonary hypertension. IVC is dilated with respiratory variation. This may suggest elevated right heart pressure, how ever in the absence of other associated findings, may only represent normal variant.  EKG 04/12/2019: Sinus rhythm 71 bpm. Nonspecific T-abnormality.   Stress test 2014: Overall Impression:  Low risk stress nuclear study with mild inferior wall ischemia. and     LV Wall Motion:  NL LV Function; NL Wall Motion  Recent labs: 03/27/2019:  Glucose 92. BUN/Cr 12/1.18. eGFR 79. Na/K 139/5.3.  Total bilirubin 1.3 (mildly elevated) H/H 15/44. MCV 92. Platelets 220 Chol 189,  TG 84, HDL 71, LDL 101.  TSH 2.8.  Review of Systems  Cardiovascular:  Positive for palpitations (Only occasional). Negative for chest pain, dyspnea on exertion, leg swelling, near-syncope and syncope.         Vitals:   09/13/22 1307  BP: 121/66  Pulse: 82  Resp: 16  SpO2: 96%      Body mass index is 28.06 kg/m. Filed Weights   09/13/22 1307  Weight: 190 lb (86.2 kg)    Objective:    Physical Exam Vitals and nursing note reviewed.  Constitutional:      General: He is not in acute distress. Neck:     Vascular: No JVD.  Cardiovascular:     Rate and Rhythm: Normal rate and regular rhythm.     Heart sounds: Normal heart sounds. No murmur heard. Pulmonary:     Effort: Pulmonary effort is normal.     Breath sounds: Normal breath sounds. No wheezing or rales.  Musculoskeletal:     Right lower leg: No edema.     Left lower leg: No edema.          Assessment & Recommendations:   61 y.o. African American male with hypertension, palpitations.   Palpitations: Symptomatic PAC/PVC, occasional atrial tachycardia, NSVT Repeat 2 week cardiac telemetry to rule out Afib. Continue diltiazem 120 mg daily  Essential hypertension: Well controlled. Continue diltiazem 120 mg daily.   F/u in 1 year   Jerry Mormon, MD Pager: [phone] Office: [phone]

## 2022-10-05 ENCOUNTER — Other Ambulatory Visit: Payer: Self-pay | Admitting: Orthopedic Surgery

## 2022-10-05 DIAGNOSIS — M48062 Spinal stenosis, lumbar region with neurogenic claudication: Secondary | ICD-10-CM

## 2022-10-31 ENCOUNTER — Other Ambulatory Visit: Payer: Self-pay | Admitting: Cardiology

## 2022-10-31 DIAGNOSIS — I4729 Other ventricular tachycardia: Secondary | ICD-10-CM

## 2022-11-02 ENCOUNTER — Ambulatory Visit
Admission: RE | Admit: 2022-11-02 | Discharge: 2022-11-02 | Disposition: A | Discharge status: Home / Self Care | Payer: 59 | Source: Ambulatory Visit | Attending: Orthopedic Surgery | Admitting: Orthopedic Surgery

## 2022-11-02 DIAGNOSIS — M48062 Spinal stenosis, lumbar region with neurogenic claudication: Secondary | ICD-10-CM

## 2022-11-02 MED ORDER — GADOPICLENOL 0.5 MMOL/ML IV SOLN
7.5000 mL | Freq: Once | INTRAVENOUS | Status: AC | PRN
  Administered 2022-11-02: 7.5 mL via INTRAVENOUS

## 2022-11-02 NOTE — Progress Notes (Signed)
Called and spoke with patient regarding his cardiac monitor results. I spoke with AS ([id number]) for scheduling echocardiogram appointment. Coy Saunas will give patient a call back, patient aware and will wait for a call.

## 2022-11-05 ENCOUNTER — Ambulatory Visit: Payer: 59

## 2022-11-05 DIAGNOSIS — I4729 Other ventricular tachycardia: Secondary | ICD-10-CM

## 2023-03-14 ENCOUNTER — Ambulatory Visit: Payer: 59 | Admitting: Cardiology

## 2023-03-31 ENCOUNTER — Encounter: Payer: Self-pay | Admitting: Cardiology

## 2023-03-31 ENCOUNTER — Ambulatory Visit: Payer: 59 | Admitting: Cardiology

## 2023-03-31 VITALS — BP 133/84 | HR 80 | Resp 16 | Ht 69.0 in | Wt 186.0 lb

## 2023-03-31 DIAGNOSIS — I4729 Other ventricular tachycardia: Secondary | ICD-10-CM

## 2023-03-31 DIAGNOSIS — I4719 Other supraventricular tachycardia: Secondary | ICD-10-CM

## 2023-03-31 NOTE — Progress Notes (Signed)
Patient referred by Marva Panda, NP for hypertension  Subjective:   Jerry Horton, male    DOB: [birth year], 62 y.o.   MRN: [id number]   Chief Complaint  Patient presents with   Premature atrial contraction   Palpitations   Follow-up    6 months    62 y.o. African American male with palpitations  Palpitations symptoms have improved. He recently underwent back surgery and is still recovering from the same.    Current Outpatient Medications:    albuterol (PROAIR HFA) 108 (90 Base) MCG/ACT inhaler, Inhale 2 puffs into the lungs every 6 (six) hours as needed., Disp: 3 Inhaler, Rfl: 3   Coenzyme Q10 (COQ10) 200 MG CAPS, Take by mouth., Disp: , Rfl:    cyclobenzaprine (FLEXERIL) 10 MG tablet, Take 10 mg by mouth at bedtime., Disp: , Rfl:    diltiazem (DILT-XR) 120 MG 24 hr capsule, TAKE 1 CAPSULE BY MOUTH EVERY DAY, Disp: 90 capsule, Rfl: 3   Flaxseed, Linseed, (FLAXSEED OIL) 1000 MG CAPS, Take by mouth., Disp: , Rfl:    Fluticasone-Salmeterol (ADVAIR) 100-50 MCG/DOSE AEPB, TAKE 1 PUFF BY MOUTH TWICE A DAY, Disp: 180 each, Rfl: 1   HYDROcodone-acetaminophen (NORCO/VICODIN) 5-325 MG tablet, Take 1 tablet by mouth as needed., Disp: , Rfl:    latanoprost (XALATAN) 0.005 % ophthalmic solution, Place 1 drop into the right eye at bedtime., Disp: , Rfl:    Omega-3 Fatty Acids (FISH OIL) 1000 MG CAPS, Take by mouth., Disp: , Rfl:    sildenafil (VIAGRA) 100 MG tablet, Take 100 mg by mouth as directed., Disp: , Rfl:    tamsulosin (FLOMAX) 0.4 MG CAPS capsule, Take 1 tablet by mouth daily., Disp: , Rfl:    Turmeric (QC TUMERIC COMPLEX PO), Take by mouth., Disp: , Rfl:    vitamin B-12 (CYANOCOBALAMIN) 500 MCG tablet, Take 500 mcg by mouth daily., Disp: , Rfl:   Cardiovascular studies:  EKG 03/31/2023: Sinus rhythm 84 bpm  Normal EKG  Echocardiogram 11/05/2022: Normal LV systolic function with visual EF 60-65%. Left ventricle cavity is normal in size. Normal left ventricular wall  thickness. Normal global wall motion. Normal diastolic filling pattern, normal LAP. No significant valvular heart disease. Compared to 05/24/2019 mild TR is now trace otherwise no significant change.  Mobile cardiac telemetry 14 days 09/13/2022 - 09/27/2022: Dominant rhythm: Sinus. HR 44-140 bpm. Avg HR 77 bpm, in sinus rhythm. 5 episodes of atrial tachycardia, fastest at 136 bpm for 7 beats, longest for 12 beats at 103 bpm. <1% isolated SVE, couplet/triplets. 1 episode of VT, at 176 bpm for 17 beats. <1% isolated VE, couplet/triplets. No atrial fibrillation/atrial flutter/VT/high grade AV block, sinus pause >3sec noted. 2 patient triggered events, correlated with sinus rhythm, VE.  Exercise treadmill stress test 12/22/2020: Exercise treadmill stress test performed using Bruce protocol.  Patient reached 12.3 METS, and 99% of age predicted maximum heart rate.  Exercise capacity was excellent.  No chest pain reported.  Normal heart rate and hemodynamic response. Stress EKG revealed no ischemic changes. Low risk study.  Recent labs: 07/01/2022: Glucose 118, BUN/Cr 15/1.11. EGFR 76. Na/K 137/4.4.  H/H 13/39. MCV 96. Platelets 168  03/27/2019:  Glucose 92. BUN/Cr 12/1.18. eGFR 79. Na/K 139/5.3.  Total bilirubin 1.3 (mildly elevated) H/H 15/44. MCV 92. Platelets 220 Chol 189, TG 84, HDL 71, LDL 101.  TSH 2.8.  Review of Systems  Cardiovascular:  Positive for palpitations (Improved). Negative for chest pain, dyspnea on exertion, leg swelling, near-syncope and syncope.  Vitals:   03/31/23 1334  BP: (!) 147/83  Pulse: 83  Resp: 16  SpO2: 97%    Body mass index is 27.47 kg/m. Filed Weights   03/31/23 1334  Weight: 186 lb (84.4 kg)    Objective:   Physical Exam Vitals and nursing note reviewed.  Constitutional:      General: He is not in acute distress. Neck:     Vascular: No JVD.  Cardiovascular:     Rate and Rhythm: Normal rate and regular rhythm.     Heart  sounds: Normal heart sounds. No murmur heard. Pulmonary:     Effort: Pulmonary effort is normal.     Breath sounds: Normal breath sounds. No wheezing or rales.  Musculoskeletal:     Right lower leg: No edema.     Left lower leg: No edema.          Assessment & Recommendations:   62 y.o. African American male with hypertension, palpitations.   Palpitations: Symptomatic PAC/PVC, occasional atrial tachycardia, NSVT Continue diltiazem 120 mg daily.  If recurrent symptoms, would add metoprolol tartrate.   Essential hypertension: Well controlled. Continue diltiazem 120 mg daily.   F/u in 3 months    Elder Negus, MD Pager: [phone] Office: [phone]

## 2023-05-04 ENCOUNTER — Emergency Department (HOSPITAL_COMMUNITY): Payer: 59

## 2023-05-04 ENCOUNTER — Emergency Department (HOSPITAL_COMMUNITY)
Admission: EM | Admit: 2023-05-04 | Discharge: 2023-05-04 | Disposition: A | Discharge status: Home / Self Care | Payer: 59 | Attending: Emergency Medicine | Admitting: Emergency Medicine

## 2023-05-04 ENCOUNTER — Encounter (HOSPITAL_COMMUNITY): Payer: Self-pay

## 2023-05-04 ENCOUNTER — Other Ambulatory Visit: Payer: Self-pay

## 2023-05-04 ENCOUNTER — Emergency Department (HOSPITAL_BASED_OUTPATIENT_CLINIC_OR_DEPARTMENT_OTHER): Payer: 59

## 2023-05-04 DIAGNOSIS — J45909 Unspecified asthma, uncomplicated: Secondary | ICD-10-CM | POA: Diagnosis not present

## 2023-05-04 DIAGNOSIS — R0789 Other chest pain: Secondary | ICD-10-CM

## 2023-05-04 DIAGNOSIS — R600 Localized edema: Secondary | ICD-10-CM | POA: Diagnosis not present

## 2023-05-04 DIAGNOSIS — I10 Essential (primary) hypertension: Secondary | ICD-10-CM | POA: Diagnosis not present

## 2023-05-04 DIAGNOSIS — R609 Edema, unspecified: Secondary | ICD-10-CM | POA: Diagnosis not present

## 2023-05-04 LAB — BASIC METABOLIC PANEL
Anion gap: 11 (ref 5–15)
BUN: 16 mg/dL (ref 6–20)
CO2: 27 mmol/L (ref 22–32)
Calcium: 9 mg/dL (ref 8.9–10.3)
Chloride: 101 mmol/L (ref 98–111)
Creatinine, Ser: 1.22 mg/dL (ref 0.61–1.24)
GFR, Estimated: >60 mL/min (ref >60)
Glucose, Bld: 102 mg/dL — ABNORMAL HIGH (ref 70–99)
Potassium: 3.4 mmol/L — ABNORMAL LOW (ref 3.5–5.1)
Sodium: 139 mmol/L (ref 135–145)

## 2023-05-04 LAB — CBC
HCT: 42.5 % (ref 39.0–52.0)
Hemoglobin: 14.5 g/dL (ref 13.0–17.0)
MCH: 32.4 pg (ref 26.0–34.0)
MCHC: 34.1 g/dL (ref 30.0–36.0)
MCV: 94.9 fL (ref 80.0–100.0)
Platelets: 204 10*3/uL (ref 150–400)
RBC: 4.48 MIL/uL (ref 4.22–5.81)
RDW: 13.4 % (ref 11.5–15.5)
WBC: 5.4 10*3/uL (ref 4.0–10.5)
nRBC: 0 % (ref 0.0–0.2)

## 2023-05-04 LAB — TROPONIN I (HIGH SENSITIVITY)
Troponin I (High Sensitivity): 4 ng/L (ref <18)
Troponin I (High Sensitivity): 4 ng/L (ref <18)

## 2023-05-04 NOTE — ED Provider Notes (Signed)
[place] [facility] DE[facility] AT [facility] Provider Note   CSN: [id number] Arrival date & time: 05/04/23  0536     History  Chief Complaint  Patient presents with   Hypertension   Leg Swelling    Jerry Horton is a 62 y.o. male.  The history is provided by the patient and medical records.  Hypertension  Jerry Horton is a 62 y.o. male who presents to the Emergency Department complaining of edema.  He presents to the emergency department for evaluation of bilateral lower extremity edema, left greater than right that started today.  He did recently return from [place] on Monday and has not rested much.  He did feel a little off balance earlier today, now resolved.  No does report epigastric pain and took an Alka-Seltzer for that, which helped his symptoms.  He did check his blood pressure today and it was elevated at 147/91.  He has a history of asthma, hypertension.  Symptoms started around 4 AM.     Home Medications Prior to Admission medications   Medication Sig Start Date End Date Taking? Authorizing Provider  albuterol (PROAIR HFA) 108 (90 Base) MCG/ACT inhaler Inhale 2 puffs into the lungs every 6 (six) hours as needed. 02/10/17   Michele Mcalpine, MD  Coenzyme Q10 (COQ10) 200 MG CAPS Take by mouth.    [provider]  cyclobenzaprine (FLEXERIL) 10 MG tablet Take 10 mg by mouth at bedtime. 02/12/20   [provider]  diltiazem (DILT-XR) 120 MG 24 hr capsule TAKE 1 CAPSULE BY MOUTH EVERY DAY 08/31/22   Patwardhan, Manish J, MD  Flaxseed, Linseed, (FLAXSEED OIL) 1000 MG CAPS Take by mouth.    [provider]  Fluticasone-Salmeterol (ADVAIR) 100-50 MCG/DOSE AEPB TAKE 1 PUFF BY MOUTH TWICE A DAY 06/02/20   Nyoka Cowden, MD  HYDROcodone-acetaminophen (NORCO/VICODIN) 5-325 MG tablet Take 1 tablet by mouth as needed.    [provider]  latanoprost (XALATAN) 0.005 % ophthalmic solution Place 1 drop into the right eye at bedtime.  06/25/22   [provider]  Omega-3 Fatty Acids (FISH OIL) 1000 MG CAPS Take by mouth.    [provider]  sildenafil (VIAGRA) 100 MG tablet Take 100 mg by mouth as directed. 02/10/21   [provider]  tamsulosin (FLOMAX) 0.4 MG CAPS capsule Take 1 tablet by mouth daily. 01/30/22   [provider]  Turmeric (QC TUMERIC COMPLEX PO) Take by mouth.    [provider]  vitamin B-12 (CYANOCOBALAMIN) 500 MCG tablet Take 500 mcg by mouth daily.    [provider]      Allergies    Patient has no known allergies.    Review of Systems   Review of Systems  All other systems reviewed and are negative.   Physical Exam Updated Vital Signs BP (!) 151/93   Pulse 70   Temp 97.7 F (36.5 C)   Resp 19   SpO2 99%  Physical Exam Vitals and nursing note reviewed.  Constitutional:      Appearance: He is well-developed.  HENT:     Head: Normocephalic and atraumatic.  Cardiovascular:     Rate and Rhythm: Normal rate and regular rhythm.     Heart sounds: No murmur heard. Pulmonary:     Effort: Pulmonary effort is normal. No respiratory distress.     Breath sounds: Normal breath sounds.  Abdominal:     Palpations: Abdomen is soft.     Tenderness:  There is no abdominal tenderness. There is no guarding or rebound.  Musculoskeletal:        General: No tenderness.     Comments: Trace pitting edema to BLE.  2+ DP pulses bilaterally  Skin:    General: Skin is warm and dry.  Neurological:     Mental Status: He is alert and oriented to person, place, and time.  Psychiatric:        Behavior: Behavior normal.     ED Results / Procedures / Treatments   Labs (all labs ordered are listed, but only abnormal results are displayed) Labs Reviewed  BASIC METABOLIC PANEL - Abnormal; Notable for the following components:      Result Value   Potassium 3.4 (*)    Glucose, Bld 102 (*)    All other components within normal limits  CBC  TROPONIN I (HIGH  SENSITIVITY)    EKG EKG Interpretation Date/Time:  Wednesday May 04 2023 05:49:43 EDT Ventricular Rate:  78 PR Interval:  162 QRS Duration:  78 QT Interval:  384 QTC Calculation: 437 R Axis:   53  Text Interpretation: Normal sinus rhythm Nonspecific T wave abnormality Abnormal ECG Confirmed by Tilden Fossa [phone]) on 05/04/2023 6:38:19 AM  Radiology DG Chest 2 View  Result Date: 05/04/2023 CLINICAL DATA:  62 year old male with history of hypertension. Leg swelling. Chest discomfort. EXAM: CHEST - 2 VIEW COMPARISON:  Chest x-ray 02/10/2017. FINDINGS: Lung volumes are normal. No consolidative airspace disease. No pleural effusions. No pneumothorax. No pulmonary nodule or mass noted. Pulmonary vasculature and the cardiomediastinal silhouette are within normal limits. IMPRESSION: No radiographic evidence of acute cardiopulmonary disease. Electronically Signed   By: Trudie Reed M.D.   On: 05/04/2023 06:09    Procedures Procedures    Medications Ordered in ED Medications - No data to display  ED Course/ Medical Decision Making/ A&P                             Medical Decision Making Amount and/or Complexity of Data Reviewed Labs: ordered. Radiology: ordered.   Patient with history of hypertension, asthma here for evaluation of bilateral lower extremity edema that started after recent long travel.  He did have mild epigastric discomfort earlier today, now resolved.  On examination he has mild edema to bilateral lower extremities, left slightly greater than right.  No evidence of soft tissue infection.  He is well-perfused.  Plan to obtain a vascular ultrasound to rule out DVT.  EKG without acute ischemic changes, initial troponin is negative.  Patient care transferred pending vascular ultrasound and repeat troponin.        Final Clinical Impression(s) / ED Diagnoses Final diagnoses:  None    Rx / DC Orders ED Discharge Orders     None         Tilden Fossa, MD 05/04/23 [phone]

## 2023-05-04 NOTE — ED Provider Notes (Signed)
Pt signed out by Dr. Madilyn Hook pending 2nd trop and [place] legs.  2nd trop nl.  [place] nl. [place]: RIGHT:  - No evidence of common femoral vein obstruction.     LEFT:  - There is no evidence of deep vein thrombosis in the lower extremity.     - No cystic structure found in the popliteal fossa.   Pt feels good.  He is stable for d/c.  Return if worse.     [name], MD 05/04/23 [phone]

## 2023-05-04 NOTE — Progress Notes (Signed)
Lower ext venous duplex  has been completed. Refer to [facility] under chart review to view preliminary results.   05/04/2023  9:52 AM Jerry Horton, Gerarda Gunther

## 2023-05-04 NOTE — ED Triage Notes (Signed)
Pt arrived from home via POV c/o HTN and LLE edema (+1 pitting). Pt states that he just returned home from [place] and has not been to bed yet, spent approx several hours on flight back.

## 2023-06-08 NOTE — Telephone Encounter (Signed)
done

## 2023-07-01 ENCOUNTER — Encounter: Payer: Self-pay | Admitting: Cardiology

## 2023-07-01 ENCOUNTER — Ambulatory Visit: Payer: 59 | Admitting: Cardiology

## 2023-07-01 VITALS — BP 153/86 | HR 77 | Resp 16 | Ht 69.0 in | Wt 188.4 lb

## 2023-07-01 DIAGNOSIS — I491 Atrial premature depolarization: Secondary | ICD-10-CM

## 2023-07-01 DIAGNOSIS — I4719 Other supraventricular tachycardia: Secondary | ICD-10-CM

## 2023-07-01 DIAGNOSIS — I10 Essential (primary) hypertension: Secondary | ICD-10-CM

## 2023-07-01 DIAGNOSIS — I4729 Other ventricular tachycardia: Secondary | ICD-10-CM

## 2023-07-01 MED ORDER — DILTIAZEM HCL ER 120 MG PO CP24: ORAL_CAPSULE | ORAL | 3 refills | Status: DC

## 2023-07-01 NOTE — Progress Notes (Signed)
Patient referred by Elie Confer, NP for hypertension  Subjective:   Jerry Horton, male    DOB: 02/08/1961, 62 y.o.   MRN: [id number]   Chief Complaint  Patient presents with   NSVT (nonsustained ventricular tachycardia) (HCC)    61 y.o. African American male with palpitations  Palpitations symptoms have improved. Bloo dpressure generally well controlled, elevated this week after he has been taking "head col" medications.    Current Outpatient Medications:    albuterol (PROAIR HFA) 108 (90 Base) MCG/ACT inhaler, Inhale 2 puffs into the lungs every 6 (six) hours as needed., Disp: 3 Inhaler, Rfl: 3   Coenzyme Q10 (COQ10) 200 MG CAPS, Take by mouth., Disp: , Rfl:    cyclobenzaprine (FLEXERIL) 10 MG tablet, Take 10 mg by mouth at bedtime., Disp: , Rfl:    diltiazem (DILT-XR) 120 MG 24 hr capsule, TAKE 1 CAPSULE BY MOUTH EVERY DAY, Disp: 90 capsule, Rfl: 3   Flaxseed, Linseed, (FLAXSEED OIL) 1000 MG CAPS, Take by mouth., Disp: , Rfl:    Fluticasone-Salmeterol (ADVAIR) 100-50 MCG/DOSE AEPB, TAKE 1 PUFF BY MOUTH TWICE A DAY, Disp: 180 each, Rfl: 1   HYDROcodone-acetaminophen (NORCO/VICODIN) 5-325 MG tablet, Take 1 tablet by mouth as needed., Disp: , Rfl:    latanoprost (XALATAN) 0.005 % ophthalmic solution, Place 1 drop into the right eye at bedtime., Disp: , Rfl:    Omega-3 Fatty Acids (FISH OIL) 1000 MG CAPS, Take by mouth., Disp: , Rfl:    sildenafil (VIAGRA) 100 MG tablet, Take 100 mg by mouth as directed., Disp: , Rfl:    tamsulosin (FLOMAX) 0.4 MG CAPS capsule, Take 1 tablet by mouth daily., Disp: , Rfl:    Turmeric (QC TUMERIC COMPLEX PO), Take by mouth., Disp: , Rfl:    vitamin B-12 (CYANOCOBALAMIN) 500 MCG tablet, Take 500 mcg by mouth daily., Disp: , Rfl:   Cardiovascular studies:  EKG 03/31/2023: Sinus rhythm 84 bpm  Normal EKG  Echocardiogram 11/05/2022: Normal LV systolic function with visual EF 60-65%. Left ventricle cavity is normal in size. Normal left  ventricular wall thickness. Normal global wall motion. Normal diastolic filling pattern, normal LAP. No significant valvular heart disease. Compared to 05/24/2019 mild TR is now trace otherwise no significant change.  Mobile cardiac telemetry 14 days 09/13/2022 - 09/27/2022: Dominant rhythm: Sinus. HR 44-140 bpm. Avg HR 77 bpm, in sinus rhythm. 5 episodes of atrial tachycardia, fastest at 136 bpm for 7 beats, longest for 12 beats at 103 bpm. <1% isolated SVE, couplet/triplets. 1 episode of VT, at 176 bpm for 17 beats. <1% isolated VE, couplet/triplets. No atrial fibrillation/atrial flutter/VT/high grade AV block, sinus pause >3sec noted. 2 patient triggered events, correlated with sinus rhythm, VE.  Exercise treadmill stress test 12/22/2020: Exercise treadmill stress test performed using Bruce protocol.  Patient reached 12.3 METS, and 99% of age predicted maximum heart rate.  Exercise capacity was excellent.  No chest pain reported.  Normal heart rate and hemodynamic response. Stress EKG revealed no ischemic changes. Low risk study.  Recent labs: 07/01/2022: Glucose 118, BUN/Cr 15/1.11. EGFR 76. Na/K 137/4.4.  H/H 13/39. MCV 96. Platelets 168  03/27/2019:  Glucose 92. BUN/Cr 12/1.18. eGFR 79. Na/K 139/5.3.  Total bilirubin 1.3 (mildly elevated) H/H 15/44. MCV 92. Platelets 220 Chol 189, TG 84, HDL 71, LDL 101.  TSH 2.8.  Review of Systems  Cardiovascular:  Negative for chest pain, dyspnea on exertion, leg swelling, near-syncope, palpitations and syncope.  Vitals:   07/01/23 1306  BP: (!) 153/86  Pulse: 77  Resp: 16  SpO2: 96%     Body mass index is 27.82 kg/m. Filed Weights   07/01/23 1306  Weight: 188 lb 6.4 oz (85.5 kg)     Objective:   Physical Exam Vitals and nursing note reviewed.  Constitutional:      General: He is not in acute distress. Neck:     Vascular: No JVD.  Cardiovascular:     Rate and Rhythm: Normal rate and regular rhythm.     Heart  sounds: Normal heart sounds. No murmur heard. Pulmonary:     Effort: Pulmonary effort is normal.     Breath sounds: Normal breath sounds. No wheezing or rales.  Musculoskeletal:     Right lower leg: No edema.     Left lower leg: No edema.          Assessment & Recommendations:   62 y.o. African American male with hypertension, palpitations.   Palpitations: Symptomatic PAC/PVC, occasional atrial tachycardia, NSVT Continue diltiazem 120 mg daily.  If recurrent symptoms, would add metoprolol tartrate.   Essential hypertension: Generally well controlled. N change made today.  F/u as needed    Elder Negus, MD Pager: [phone] Office: [phone]

## 2023-08-18 ENCOUNTER — Other Ambulatory Visit: Payer: Self-pay | Admitting: Orthopedic Surgery

## 2023-08-18 DIAGNOSIS — M4316 Spondylolisthesis, lumbar region: Secondary | ICD-10-CM

## 2023-08-18 DIAGNOSIS — M48062 Spinal stenosis, lumbar region with neurogenic claudication: Secondary | ICD-10-CM

## 2023-08-30 ENCOUNTER — Ambulatory Visit
Admission: RE | Admit: 2023-08-30 | Discharge: 2023-08-30 | Disposition: A | Discharge status: Home / Self Care | Payer: 59 | Source: Ambulatory Visit | Attending: Orthopedic Surgery | Admitting: Orthopedic Surgery

## 2023-08-30 DIAGNOSIS — M48062 Spinal stenosis, lumbar region with neurogenic claudication: Secondary | ICD-10-CM

## 2023-08-30 DIAGNOSIS — M4316 Spondylolisthesis, lumbar region: Secondary | ICD-10-CM

## 2024-04-10 ENCOUNTER — Encounter: Payer: Self-pay | Admitting: Physician Assistant

## 2024-04-10 ENCOUNTER — Ambulatory Visit: Attending: Cardiology | Admitting: Physician Assistant

## 2024-04-10 VITALS — BP 144/84 | HR 75 | Ht 69.0 in | Wt 177.6 lb

## 2024-04-10 DIAGNOSIS — I10 Essential (primary) hypertension: Secondary | ICD-10-CM | POA: Diagnosis not present

## 2024-04-10 DIAGNOSIS — I4719 Other supraventricular tachycardia: Secondary | ICD-10-CM | POA: Diagnosis not present

## 2024-04-10 DIAGNOSIS — R072 Precordial pain: Secondary | ICD-10-CM

## 2024-04-10 DIAGNOSIS — R0989 Other specified symptoms and signs involving the circulatory and respiratory systems: Secondary | ICD-10-CM | POA: Diagnosis not present

## 2024-04-10 MED ORDER — METOPROLOL TARTRATE 100 MG PO TABS: ORAL_TABLET | ORAL | 0 refills | Status: AC

## 2024-04-10 NOTE — Assessment & Plan Note (Signed)
 Overall palpitations controlled. Continue Diltiazem  120 mg once daily.

## 2024-04-10 NOTE — Patient Instructions (Signed)
 Medication Instructions:  Your physician recommends that you continue on your current medications as directed. Please refer to the Current Medication list given to you today.  *If you need a refill on your cardiac medications before your next appointment, please call your pharmacy*  Lab Work: TODAY:  BMET  If you have labs (blood work) drawn today and your tests are completely normal, you will receive your results only by: MyChart Message (if you have MyChart) OR A paper copy in the mail If you have any lab test that is abnormal or we need to change your treatment, we will call you to review the results.  Testing/Procedures: Your physician has requested that you have an echocardiogram. Echocardiography is a painless test that uses sound waves to create images of your heart. It provides your doctor with information about the size and shape of your heart and how well your heart's chambers and valves are working. This procedure takes approximately one hour. There are no restrictions for this procedure. Please do NOT wear cologne, perfume, aftershave, or lotions (deodorant is allowed). Please arrive 15 minutes prior to your appointment time.  Please note: We ask at that you not bring children with you during ultrasound (echo/ vascular) testing. Due to room size and safety concerns, children are not allowed in the ultrasound rooms during exams. Our front office staff cannot provide observation of children in our lobby area while testing is being conducted. An adult accompanying a patient to their appointment will only be allowed in the ultrasound room at the discretion of the ultrasound technician under special circumstances. We apologize for any inconvenience.  Your physician has requested that you have a carotid duplex. This test is an ultrasound of the carotid arteries in your neck. It looks at blood flow through these arteries that supply the brain with blood. Allow one hour for this exam. There  are no restrictions or special instructions.   Your physician has requested that you have cardiac CT. Cardiac computed tomography (CT) is a painless test that uses an x-ray machine to take clear, detailed pictures of your heart. For further information please visit [email or web address] Please follow instruction sheet BELOW:    Your cardiac CT will be scheduled at one of the below locations:   [facility] [address] [place], [place] [address] [phone]  OR  [facility] [address] [place], [place] [address] [phone]  OR   [facility] [address] [place], [place] [address] [phone]  OR   [facility] [address] [place], [place] [address] [phone]  OR   [facility] and Vascular Tower [address]  [place], [place] [address] Opening February 20, 2024  If scheduled at [facility], please arrive at the [facility] and Children's Entrance (Entrance C2) of [facility] 30 minutes prior to test start time. You can use the FREE valet parking offered at entrance C (encouraged to control the heart rate for the test)  Proceed to the [facility] Radiology Department (first floor) to check-in and test prep.   All radiology patients and guests should use entrance C2 at [facility], accessed from [facility], even though the hospital's physical address listed is [address]    If scheduled at the Heart and Vascular Tower at [address], please enter the parking lot using the [address] entrance and use the FREE valet service at  the patient drop-off area. Enter the buidling and check-in with registration on the main floor.  If scheduled at [facility] or [facility], please arrive 15 mins early for check-in and test prep.  There is spacious parking and  easy access to the radiology department from the [facility] Heart and Vascular entrance. Please enter here and check-in with the desk attendant.   If scheduled at [facility], please arrive 30 minutes early for check-in and test prep.  Please follow these instructions carefully (unless otherwise directed):  An IV will be required for this test and Nitroglycerin will be given.  Hold all erectile dysfunction medications at least 3 days (72 hrs) prior to test. (Ie viagra, cialis, sildenafil, tadalafil, etc)   On the Night Before the Test: Be sure to Drink plenty of water. Do not consume any caffeinated/decaffeinated beverages or chocolate 12 hours prior to your test. Do not take any antihistamines 12 hours prior to your test.   On the Day of the Test: Drink plenty of water until 1 hour prior to the test. Do not eat any food 1 hour prior to test. You may take your regular medications prior to the test.  Take metoprolol  (Lopressor ) 100 MG two hours prior to test. THIS [facility] TO [facility]  After the Test: Drink plenty of water. After receiving IV contrast, you may experience a mild flushed feeling. This is normal. On occasion, you may experience a mild rash up to 24 hours after the test. This is not dangerous. If this occurs, you can take Benadryl 25 mg, Zyrtec, Claritin, or Allegra and increase your fluid intake. (Patients taking Tikosyn should avoid Benadryl, and may take Zyrtec, Claritin, or Allegra) If you experience trouble breathing, this can be serious. If it is severe call 911 IMMEDIATELY. If it is mild, please call our office.  We will call to schedule your test 2-4 weeks out understanding that some insurance companies will need an authorization prior to the service being performed.   For more information and frequently asked questions, please visit our website : [email or web address]  For non-scheduling related questions, please contact the cardiac imaging nurse  navigator should you have any questions/concerns: Cardiac Imaging Nurse Navigators Direct Office Dial: [phone]   For scheduling needs, including cancellations and rescheduling, please call [place], [phone].   Follow-Up: At [facility], you and your health needs are our priority.  As part of our continuing mission to provide you with exceptional heart care, our providers are all part of one team.  This team includes your primary Cardiologist (physician) and Advanced Practice Providers or APPs (Physician Assistants and Nurse Practitioners) who all work together to provide you with the care you need, when you need it.  Your next appointment:   2-3 month(s)  Provider:   Marlyse Single, PA-C          We recommend signing up for the patient portal called MyChart.  Sign up information is provided on this After Visit Summary.  MyChart is used to connect with patients for Virtual Visits (Telemedicine).  Patients are able to view lab/test results, encounter notes, upcoming appointments, etc.  Non-urgent messages can be sent to your provider as well.   To learn more about what you can do with MyChart, go to [email or web address].   Other Instructions

## 2024-04-10 NOTE — Progress Notes (Signed)
 OFFICE NOTE:    Date:  04/10/2024  ID:  Hedda Liv, DOB 10-31-1960, MRN [id number] PCP: Verlene Glimpse, NP  [facility] Providers Cardiologist:  None       Patient Profile:  Palpitations  ETT 12/22/20: low risk, 12.3 METs TTE 11/05/22: EF 60-65, no WMA Monitor 09/2022: 5 episodes of ATach - longest 12 beats, NSVT x 1, no AF/FL or high grade HB Hypertension  MVP        Discussed the use of AI scribe software for clinical note transcription with the patient, who gave verbal consent to proceed. History of Present Illness Jerry Horton is a 63 y.o. male who returns for evaluation of chest pain. He was last seen by Dr. Filiberto Hug in 06/2023.   He experiences a 'funny' feeling in his chest, particularly during physical exertion such as climbing stairs or doing yard work. These episodes are accompanied by lightheadedness and a sensation of tightness, though not pain, in the chest. He recalls a specific incident a few weeks ago when he felt lightheaded after climbing to the third floor of his daughter's apartment building. Similar symptoms occurred while mowing the grass and during sexual activity, where he felt his heart pounding and had to stop due to discomfort. No chest pain at rest, but there is a sensation of tightness and discomfort during exertion. Occasional shortness of breath is noted, which he attributes to his asthma, and he mentions that his breathing has worsened recently. No jaw, arm, or back pain associated with these episodes.  His aunt had asthma that led to a heart attack. His mother and siblings have a history of atrial fibrillation, though he has not been diagnosed with it himself. He denies smoking, alcohol, or drug use.    ROS-See HPI    Studies Reviewed:  EKG Interpretation Date/Time:  Tuesday April 10 2024 16:07:57 EDT Ventricular Rate:  75 PR Interval:  162 QRS Duration:  88 QT Interval:  364 QTC Calculation: 406 R Axis:   42  Text  Interpretation: Normal sinus rhythm Nonspecific T wave abnormality When compared with ECG of 04-May-2023 05:49, No significant change was found Confirmed by Marlyse Single [phone]) on 04/10/2024 4:14:49 PM    Risk Assessment/Calculations:   HYPERTENSION CONTROL Vitals:   04/10/24 1604 04/10/24 1636  BP: (!) 108/56 (!) 144/84    The patient's blood pressure is elevated above target today.  In order to address the patient's elevated BP: Blood pressure will be monitored at home to determine if medication changes need to be made.         Physical Exam:  VS:  BP (!) 144/84   Pulse 75   Ht 5' 9 (1.753 m)   Wt 177 lb 9.6 oz (80.6 kg)   SpO2 97%   BMI 26.23 kg/m    Wt Readings from Last 3 Encounters:  04/10/24 177 lb 9.6 oz (80.6 kg)  07/01/23 188 lb 6.4 oz (85.5 kg)  03/31/23 186 lb (84.4 kg)    Constitutional:      Appearance: Healthy appearance. Not in distress.  Neck:     Vascular: Carotid bruit (?R carotid bruit) present. JVD normal.  Pulmonary:     Breath sounds: Normal breath sounds. No wheezing. No rales.  Cardiovascular:     Normal rate. Regular rhythm.     Murmurs: There is no murmur.  Edema:    Peripheral edema absent.  Abdominal:     Palpations: Abdomen is soft.  Assessment and Plan:    Assessment & Plan Precordial chest pain He notes episodes of exertional chest discomfort that is hard to describe with associated lightheadedness. This is unusual for him and he is concerned about a blockage. He would like to rule this out. His symptoms sound possibly cardiac. I think it is reasonable to update his echocardiogram and obtain a CCTA to rule out CAD. He agrees with this plan.  - Order CCTA - Order echocardiogram  - Schedule follow-up appointment in a couple of months to review test results. Atrial tachycardia (HCC) Overall palpitations controlled. Continue Diltiazem  120 mg once daily. Essential hypertension BP elevated. He notes fluctuating blood pressure  at home. I have asked him to monitor and send readings in a week for review.  -Continue Diltiazem  120 mg once daily. Bruit of right carotid artery He is able to hear his heartbeat and there is a questionable murmur over his R carotid artery. - Order Carotid US .         Dispo:  Return in 3 months (on 07/11/2024) for  Routine Follow Up, w/ Marlyse Single, PA-C.  Signed, Marlyse Single, PA-C

## 2024-04-10 NOTE — Assessment & Plan Note (Signed)
 BP elevated. He notes fluctuating blood pressure at home. I have asked him to monitor and send readings in a week for review.  -Continue Diltiazem  120 mg once daily.

## 2024-04-11 ENCOUNTER — Encounter (INDEPENDENT_AMBULATORY_CARE_PROVIDER_SITE_OTHER): Payer: Self-pay | Admitting: Otolaryngology

## 2024-04-11 ENCOUNTER — Ambulatory Visit: Payer: Self-pay | Admitting: Physician Assistant

## 2024-04-11 ENCOUNTER — Ambulatory Visit (INDEPENDENT_AMBULATORY_CARE_PROVIDER_SITE_OTHER): Admitting: Otolaryngology

## 2024-04-11 VITALS — BP 148/90 | HR 73

## 2024-04-11 DIAGNOSIS — J310 Chronic rhinitis: Secondary | ICD-10-CM | POA: Diagnosis not present

## 2024-04-11 DIAGNOSIS — R0981 Nasal congestion: Secondary | ICD-10-CM | POA: Diagnosis not present

## 2024-04-11 DIAGNOSIS — J343 Hypertrophy of nasal turbinates: Secondary | ICD-10-CM

## 2024-04-11 DIAGNOSIS — J342 Deviated nasal septum: Secondary | ICD-10-CM | POA: Diagnosis not present

## 2024-04-11 LAB — BASIC METABOLIC PANEL WITH GFR
BUN/Creatinine Ratio: 18 (ref 10–24)
BUN: 22 mg/dL (ref 8–27)
CO2: 20 mmol/L (ref 20–29)
Calcium: 9.7 mg/dL (ref 8.6–10.2)
Chloride: 105 mmol/L (ref 96–106)
Creatinine, Ser: 1.25 mg/dL (ref 0.76–1.27)
Glucose: 80 mg/dL (ref 70–99)
Potassium: 4.9 mmol/L (ref 3.5–5.2)
Sodium: 143 mmol/L (ref 134–144)
eGFR: 66 mL/min/{1.73_m2} (ref >59)

## 2024-04-11 MED ORDER — FLUTICASONE PROPIONATE 50 MCG/ACT NA SUSP: 2.0000 | Freq: Every day | NASAL | 10 refills | Status: AC

## 2024-04-11 NOTE — Progress Notes (Signed)
 Patient ID: Jerry Horton, male   DOB: 24-Dec-1960, 63 y.o.   MRN: [id number]  Follow-up: Chronic nasal obstruction, eustachian tube dysfunction  HPI: The patient is a 63 year old male who returns today for his follow-up evaluation.  The patient has a history of chronic rhinitis, nasal mucosal congestion, nasal septal deviation, and bilateral inferior turbinate hypertrophy.  He also has a history of eustachian tube dysfunction.  The patient was treated with Flonase nasal spray and nasal saline irrigation.  The patient returns today complaining of increasing nasal congestion and facial pressure.  His eustachian tube dysfunction has mostly resolved.  His symptoms were worse during the spring allergy season.  He is performing the nasal saline irrigation regularly.  Exam: General: Communicates without difficulty, well nourished, no acute distress. Head: Normocephalic, no evidence injury, no tenderness, facial buttresses intact without stepoff. Face/sinus: No tenderness to palpation and percussion. Facial movement is normal and symmetric. Eyes: PERRL, EOMI. No scleral icterus, conjunctivae clear. Neuro: CN II exam reveals vision grossly intact.  No nystagmus at any point of gaze. Ears: Auricles well formed without lesions.  Ear canals are intact without mass or lesion.  No erythema or edema is appreciated.  The TMs are intact without fluid. Nose: External evaluation reveals normal support and skin without lesions.  Dorsum is intact.  Anterior rhinoscopy reveals congested mucosa over anterior aspect of inferior turbinates and deviated septum.  No purulence noted. Oral:  Oral cavity and oropharynx are intact, symmetric, without erythema or edema.  Mucosa is moist without lesions. Neck: Full range of motion without pain.  There is no significant lymphadenopathy.  No masses palpable.  Thyroid  bed within normal limits to palpation.  Parotid glands and submandibular glands equal bilaterally without mass.  Trachea is  midline. Neuro:  CN 2-12 grossly intact.   Assessment: 1.  Chronic rhinitis with nasal mucosal congestion, nasal septal deviation, and bilateral inferior turbinate hypertrophy. 2.  No polyps, mass, or lesion is noted today. 3.  No acute infection is noted today.  Plan: 1.  The physical exam findings are reviewed with the patient. 2.  Continue with Flonase nasal spray daily. 3.  Nasal saline irrigations as needed. 4.  The patient will return for reevaluation in 6 weeks.  If he continues to be symptomatic, he may benefit from surgical intervention with septoplasty and turbinate reduction.

## 2024-04-12 DIAGNOSIS — J342 Deviated nasal septum: Secondary | ICD-10-CM | POA: Insufficient documentation

## 2024-04-12 DIAGNOSIS — J343 Hypertrophy of nasal turbinates: Secondary | ICD-10-CM | POA: Insufficient documentation

## 2024-04-12 DIAGNOSIS — J310 Chronic rhinitis: Secondary | ICD-10-CM | POA: Insufficient documentation

## 2024-04-16 NOTE — Progress Notes (Signed)
 Pt has been made aware of normal result and verbalized understanding.  jw

## 2024-04-20 ENCOUNTER — Encounter (HOSPITAL_COMMUNITY): Payer: Self-pay

## 2024-04-24 ENCOUNTER — Ambulatory Visit (HOSPITAL_COMMUNITY)
Admission: RE | Admit: 2024-04-24 | Discharge: 2024-04-24 | Disposition: A | Discharge status: Home / Self Care | Source: Ambulatory Visit | Attending: Physician Assistant | Admitting: Physician Assistant

## 2024-04-24 DIAGNOSIS — R072 Precordial pain: Secondary | ICD-10-CM | POA: Diagnosis present

## 2024-04-24 MED ORDER — DILTIAZEM HCL 25 MG/5ML IV SOLN
10.0000 mg | INTRAVENOUS | Status: DC | PRN
Start: 2024-04-24 — End: 2024-04-25

## 2024-04-24 MED ORDER — NITROGLYCERIN 0.4 MG SL SUBL
0.8000 mg | SUBLINGUAL_TABLET | Freq: Once | SUBLINGUAL | Status: AC
  Administered 2024-04-24: 0.8 mg via SUBLINGUAL

## 2024-04-24 MED ORDER — IOHEXOL 350 MG/ML SOLN
100.0000 mL | Freq: Once | INTRAVENOUS | Status: AC | PRN
  Administered 2024-04-24: 100 mL via INTRAVENOUS

## 2024-04-24 MED ORDER — METOPROLOL TARTRATE 5 MG/5ML IV SOLN: 10.0000 mg | Freq: Once | INTRAVENOUS | Status: DC | PRN

## 2024-04-25 ENCOUNTER — Encounter: Payer: Self-pay | Admitting: Physician Assistant

## 2024-05-04 ENCOUNTER — Ambulatory Visit (HOSPITAL_COMMUNITY)
Admission: RE | Admit: 2024-05-04 | Discharge: 2024-05-04 | Disposition: A | Discharge status: Home / Self Care | Source: Ambulatory Visit | Attending: Physician Assistant | Admitting: Physician Assistant

## 2024-05-04 ENCOUNTER — Ambulatory Visit (HOSPITAL_BASED_OUTPATIENT_CLINIC_OR_DEPARTMENT_OTHER)
Admission: RE | Admit: 2024-05-04 | Discharge: 2024-05-04 | Disposition: A | Discharge status: Home / Self Care | Source: Ambulatory Visit | Attending: Physician Assistant | Admitting: Physician Assistant

## 2024-05-04 DIAGNOSIS — R0989 Other specified symptoms and signs involving the circulatory and respiratory systems: Secondary | ICD-10-CM | POA: Insufficient documentation

## 2024-05-04 DIAGNOSIS — R072 Precordial pain: Secondary | ICD-10-CM

## 2024-05-04 LAB — ECHOCARDIOGRAM COMPLETE
AR max vel: 1.77 cm2
AV Area VTI: 1.66 cm2
AV Area mean vel: 1.72 cm2
AV Mean grad: 4 mmHg
AV Peak grad: 8.2 mmHg
Ao pk vel: 1.43 m/s
Area-P 1/2: 4.39 cm2
S' Lateral: 2.05 cm

## 2024-05-07 ENCOUNTER — Encounter: Payer: Self-pay | Admitting: Physician Assistant

## 2024-05-26 ENCOUNTER — Encounter: Payer: Self-pay | Admitting: Internal Medicine

## 2024-05-28 ENCOUNTER — Encounter (INDEPENDENT_AMBULATORY_CARE_PROVIDER_SITE_OTHER): Payer: Self-pay | Admitting: Otolaryngology

## 2024-05-28 ENCOUNTER — Ambulatory Visit (INDEPENDENT_AMBULATORY_CARE_PROVIDER_SITE_OTHER): Admitting: Otolaryngology

## 2024-05-28 VITALS — BP 161/81 | HR 68

## 2024-05-28 DIAGNOSIS — J343 Hypertrophy of nasal turbinates: Secondary | ICD-10-CM | POA: Diagnosis not present

## 2024-05-28 DIAGNOSIS — R0981 Nasal congestion: Secondary | ICD-10-CM

## 2024-05-28 DIAGNOSIS — J310 Chronic rhinitis: Secondary | ICD-10-CM | POA: Diagnosis not present

## 2024-05-28 DIAGNOSIS — J342 Deviated nasal septum: Secondary | ICD-10-CM | POA: Diagnosis not present

## 2024-05-29 NOTE — Progress Notes (Signed)
 Patient ID: Jerry Horton, male   DOB: 1960/11/19, 63 y.o.   MRN: [id number]  Follow-up: Chronic nasal obstruction  HPI: The patient is a 63 year old male who returns today for his follow-up evaluation.  The patient was last seen in June 2025.  At that time, he was complaining of chronic nasal obstruction, secondary to nasal septal deviation and bilateral inferior turbinate hypertrophy.  He was treated with Flonase  nasal spray and nasal saline irrigation.  The patient returns today reporting improvement in his nasal breathing.  He denies any recent facial pain, fever, or visual change.  Exam: General: Communicates without difficulty, well nourished, no acute distress. Head: Normocephalic, no evidence injury, no tenderness, facial buttresses intact without stepoff. Face/sinus: No tenderness to palpation and percussion. Facial movement is normal and symmetric. Eyes: PERRL, EOMI. No scleral icterus, conjunctivae clear. Neuro: CN II exam reveals vision grossly intact.  No nystagmus at any point of gaze. Ears: Auricles well formed without lesions.  Ear canals are intact without mass or lesion.  No erythema or edema is appreciated.  The TMs are intact without fluid. Nose: External evaluation reveals normal support and skin without lesions.  Dorsum is intact.  Anterior rhinoscopy reveals congested mucosa over anterior aspect of inferior turbinates and deviated septum.  No purulence noted. Oral:  Oral cavity and oropharynx are intact, symmetric, without erythema or edema.  Mucosa is moist without lesions. Neck: Full range of motion without pain.  There is no significant lymphadenopathy.  No masses palpable.  Thyroid  bed within normal limits to palpation.  Parotid glands and submandibular glands equal bilaterally without mass.  Trachea is midline. Neuro:  CN 2-12 grossly intact.   Assessment: 1.  Chronic rhinitis with nasal mucosal congestion, nasal septal deviation, and bilateral inferior turbinate hypertrophy.   The severity of his nasal congestion has decreased. 2.  No infection is noted today.  Plan: 1.  The physical exam findings are reviewed with the patient. 2.  Continue with Flonase  nasal spray 2 sprays each nostril daily. 3.  The patient will return for reevaluation in 6 months.

## 2024-07-16 ENCOUNTER — Encounter: Payer: Self-pay | Admitting: Internal Medicine

## 2024-08-07 ENCOUNTER — Ambulatory Visit (AMBULATORY_SURGERY_CENTER)

## 2024-08-07 VITALS — Ht 69.0 in | Wt 185.0 lb

## 2024-08-07 DIAGNOSIS — Z1211 Encounter for screening for malignant neoplasm of colon: Secondary | ICD-10-CM

## 2024-08-07 MED ORDER — NA SULFATE-K SULFATE-MG SULF 17.5-3.13-1.6 GM/177ML PO SOLN: 1.0000 | Freq: Once | ORAL | 0 refills | Status: AC

## 2024-08-07 NOTE — Progress Notes (Signed)

## 2024-08-09 ENCOUNTER — Telehealth: Payer: Self-pay | Admitting: Cardiology

## 2024-08-09 DIAGNOSIS — I491 Atrial premature depolarization: Secondary | ICD-10-CM

## 2024-08-09 DIAGNOSIS — I10 Essential (primary) hypertension: Secondary | ICD-10-CM

## 2024-08-09 MED ORDER — DILTIAZEM HCL ER 120 MG PO CP24: ORAL_CAPSULE | ORAL | 2 refills | Status: AC

## 2024-08-09 NOTE — Telephone Encounter (Signed)
*  STAT* If patient is at the pharmacy, call can be transferred to refill team.   1. Which medications need to be refilled? (please list name of each medication and dose if known)  diltiazem  (DILT-XR) 120 MG 24 hr capsule  2. Which pharmacy/location (including street and city if local pharmacy) is medication to be sent to? [id number] [place], [place] - [address]    3. Do they need a 30 day or 90 day supply? 90 day supply

## 2024-08-09 NOTE — Telephone Encounter (Signed)
 RX sent in

## 2024-08-16 ENCOUNTER — Encounter: Payer: Self-pay | Admitting: Internal Medicine

## 2024-08-24 ENCOUNTER — Ambulatory Visit (AMBULATORY_SURGERY_CENTER): Admitting: Internal Medicine

## 2024-08-24 ENCOUNTER — Encounter: Payer: Self-pay | Admitting: Internal Medicine

## 2024-08-24 VITALS — BP 110/74 | HR 68 | Temp 98.2°F | Resp 16 | Ht 69.0 in | Wt 185.0 lb

## 2024-08-24 DIAGNOSIS — D122 Benign neoplasm of ascending colon: Secondary | ICD-10-CM

## 2024-08-24 DIAGNOSIS — Z1211 Encounter for screening for malignant neoplasm of colon: Secondary | ICD-10-CM | POA: Diagnosis present

## 2024-08-24 DIAGNOSIS — D123 Benign neoplasm of transverse colon: Secondary | ICD-10-CM

## 2024-08-24 DIAGNOSIS — K5730 Diverticulosis of large intestine without perforation or abscess without bleeding: Secondary | ICD-10-CM | POA: Diagnosis not present

## 2024-08-24 MED ORDER — SODIUM CHLORIDE 0.9 % IV SOLN: 500.0000 mL | INTRAVENOUS | Status: DC

## 2024-08-24 NOTE — Op Note (Signed)
 [facility] Patient Name: Neiman Roots Procedure Date: 08/24/2024 10:25 AM MRN: [id number] Endoscopist: Norleen SAILOR. Abran , MD, [id number] Age: 63 Referring MD:  Date of Birth: 04-28-1961 Gender: Male Account #: [id number] Procedure:                Colonoscopy with cold snare polypectomy x 4 Indications:              Screening for colorectal malignant neoplasm. Index                            exam was negative for neoplasia Medicines:                Monitored Anesthesia Care Procedure:                Pre-Anesthesia Assessment:                           - Prior to the procedure, a History and Physical                            was performed, and patient medications and                            allergies were reviewed. The patient's tolerance of                            previous anesthesia was also reviewed. The risks                            and benefits of the procedure and the sedation                            options and risks were discussed with the patient.                            All questions were answered, and informed consent                            was obtained. Prior Anticoagulants: The patient has                            taken no anticoagulant or antiplatelet agents. ASA                            Grade Assessment: II - A patient with mild systemic                            disease. After reviewing the risks and benefits,                            the patient was deemed in satisfactory condition to                            undergo the procedure.  After obtaining informed consent, the colonoscope                            was passed under direct vision. Throughout the                            procedure, the patient's blood pressure, pulse, and                            oxygen saturations were monitored continuously. The                            Olympus Scope SN: [id number] was introduced through                             the anus and advanced to the the cecum, identified                            by appendiceal orifice and ileocecal valve. The                            ileocecal valve, appendiceal orifice, and rectum                            were photographed. The quality of the bowel                            preparation was excellent. The colonoscopy was                            performed without difficulty. The patient tolerated                            the procedure well. The bowel preparation used was                            SUPREP via split dose instruction. Scope In: 10:41:12 AM Scope Out: 10:58:03 AM Scope Withdrawal Time: 0 hours 15 minutes 2 seconds  Total Procedure Duration: 0 hours 16 minutes 51 seconds  Findings:                 Four polyps were found in the transverse colon and                            ascending colon. The polyps were 4 to 5 mm in size.                            These polyps were removed with a cold snare.                            Resection and retrieval were complete.                           Diverticula were found  in the left colon and right                            colon.                           The exam was otherwise without abnormality on                            direct and retroflexion views. Complications:            No immediate complications. Estimated blood loss:                            None. Estimated Blood Loss:     Estimated blood loss: none. Impression:               - Four 4 to 5 mm polyps in the transverse colon and                            in the ascending colon, removed with a cold snare.                            Resected and retrieved.                           - Diverticulosis in the left colon and in the right                            colon.                           - The examination was otherwise normal on direct                            and retroflexion views. Recommendation:           - Repeat colonoscopy  in 5 years for surveillance.                           - Patient has a contact number available for                            emergencies. The signs and symptoms of potential                            delayed complications were discussed with the                            patient. Return to normal activities tomorrow.                            Written discharge instructions were provided to the                            patient.                           -  Resume previous diet.                           - Continue present medications.                           - Await pathology results. Norleen SAILOR. Abran, MD 08/24/2024 11:04:31 AM This report has been signed electronically.

## 2024-08-24 NOTE — Progress Notes (Signed)
 HISTORY OF PRESENT ILLNESS:  Jerry Horton is a 63 y.o. male who presents today for screening colonoscopy.  Previous exam 2015 was negative for neoplasia.  No complaints  REVIEW OF SYSTEMS:  All non-GI ROS negative except for  Past Medical History:  Diagnosis Date   Allergic rhinitis, cause unspecified    Anxiety state, unspecified    Asthma, moderate    Chest pain    CCTA 04/24/24: CAC score 0; no CAD   Epicondylitis, lateral, right CHRONIC   History of carotid Doppler ultrasound    Carotid US  05/04/2024: No significant ICA stenosis bilaterally   History of echocardiogram 01/18/2007   TTE 05/04/2024: EF 65-70, no RWMA, normal RVSF, trivial MR, RAP 3   History of nuclear stress test 01/07/2011   negative    History of nuclear stress test 05/09/2013   mild inferior wall ischemia, ST abnormalities    Hypertension    MVP (mitral valve prolapse) OCC . PALPITATIONS   Palpitations OCCASIONAL   Pulmonary nodule STABLE PER CT    Past Surgical History:  Procedure Laterality Date   OPEN RESECTION DISTAL RIGHT CLAVICLE AND [facility] JOINT ARTHRITIS  09-23-2010   DR Jerry Horton   REPAIR EXTENSOR TENDON  07/07/2012   Procedure: REPAIR EXTENSOR TENDON;  Surgeon: Jerry SHAUNNA Amy, MD;  Location: [facility];  Service: Orthopedics;  Laterality: Right;  REPAIR OF COMMON EXTENSOR TENDON WITH PARTIAL LATERAL EPICONDYLECTOMY OF THE RIGHT ELBOW    RESECTION OF DISTAL RIGHT CLAVICLE & ACROMION  09/2010   [facility]   SHOULDER ARTHROSCOPY  01/2007   [facility]--  RIGHT SHOULDER   UMBILICAL HERNIA REPAIR  CHILD    Social History Jerry Horton  reports that he has never smoked. He has never used smokeless tobacco. He reports that he does not drink alcohol and does not use drugs.  family history includes Cancer - Other in his father; Hypertension in his brother, brother, brother, mother, sister, and sister; Irregular heart beat in his brother and sister.  No Known Allergies      PHYSICAL EXAMINATION: Vital signs: BP 128/80   Pulse 71   Temp 98.2 F (36.8 C)   Ht 5' 9 (1.753 m)   Wt 185 lb (83.9 kg)   SpO2 98%   BMI 27.32 kg/m  General: Well-developed, well-nourished, no acute distress HEENT: Sclerae are anicteric, conjunctiva pink. Oral mucosa intact Lungs: Clear Heart: Regular Abdomen: soft, nontender, nondistended, no obvious ascites, no peritoneal signs, normal bowel sounds. No organomegaly. Extremities: No edema Psychiatric: alert and oriented x3. Cooperative     ASSESSMENT:  Colon cancer screening   PLAN:   Screening colonoscopy

## 2024-08-24 NOTE — Progress Notes (Signed)
 Sedate, gd SR, tolerated procedure well, VSS, report to RN

## 2024-08-24 NOTE — Progress Notes (Signed)
 Called to room to assist during endoscopic procedure.  Patient ID and intended procedure confirmed with present staff. Received instructions for my participation in the procedure from the performing physician.

## 2024-08-24 NOTE — Progress Notes (Signed)
Pt states no changes to health hx since previsit

## 2024-08-24 NOTE — Patient Instructions (Addendum)
 Resume previous diet Continue present medications Await pathology results Repeat colonoscopy in 5 years   Handouts/information given for polyps, diverticulosis   YOU HAD AN ENDOSCOPIC PROCEDURE TODAY AT THE [facility]:   Refer to the procedure report that was given to you for any specific questions about what was found during the examination.  If the procedure report does not answer your questions, please call your gastroenterologist to clarify.  If you requested that your care partner not be given the details of your procedure findings, then the procedure report has been included in a sealed envelope for you to review at your convenience later.  YOU SHOULD EXPECT: Some feelings of bloating in the abdomen. Passage of more gas than usual.  Walking can help get rid of the air that was put into your GI tract during the procedure and reduce the bloating. If you had a lower endoscopy (such as a colonoscopy or flexible sigmoidoscopy) you may notice spotting of blood in your stool or on the toilet paper. If you underwent a bowel prep for your procedure, you may not have a normal bowel movement for a few days.  Please Note:  You might notice some irritation and congestion in your nose or some drainage.  This is from the oxygen used during your procedure.  There is no need for concern and it should clear up in a day or so.  SYMPTOMS TO REPORT IMMEDIATELY:  Following lower endoscopy (colonoscopy):  Excessive amounts of blood in the stool  Significant tenderness or worsening of abdominal pains  Swelling of the abdomen that is new, acute  Fever of 100F or higher For urgent or emergent issues, a gastroenterologist can be reached at any hour by calling [phone]. Do not use MyChart messaging for urgent concerns.   DIET:  We do recommend a small meal at first, but then you may proceed to your regular diet.  Drink plenty of fluids but you should avoid alcoholic beverages for 24  hours.  ACTIVITY:  You should plan to take it easy for the rest of today and you should NOT DRIVE or use heavy machinery until tomorrow (because of the sedation medicines used during the test).    FOLLOW UP: Our staff will call the number listed on your records the next business day following your procedure.  We will call around 7:15- 8:00 am to check on you and address any questions or concerns that you may have regarding the information given to you following your procedure. If we do not reach you, we will leave a message.     If any biopsies were taken you will be contacted by phone or by letter within the next 1-3 weeks.  Please call us  at [phone] if you have not heard about the biopsies in 3 weeks.    SIGNATURES/CONFIDENTIALITY: You and/or your care partner have signed paperwork which will be entered into your electronic medical record.  These signatures attest to the fact that that the information above on your After Visit Summary has been reviewed and is understood.  Full responsibility of the confidentiality of this discharge information lies with you and/or your care-partner.

## 2024-08-27 ENCOUNTER — Telehealth: Payer: Self-pay | Admitting: *Deleted

## 2024-08-27 NOTE — Telephone Encounter (Signed)
  Follow up Call-     08/24/2024    9:27 AM  Call back number  Post procedure Call Back phone  # [phone]  Permission to leave phone message Yes     Patient questions:  Do you have a fever, pain , or abdominal swelling? No. Pain Score  0 *  Have you tolerated food without any problems? Yes.    Have you been able to return to your normal activities? Yes.    Do you have any questions about your discharge instructions: Diet   No. Medications  No. Follow up visit  No.  Do you have questions or concerns about your Care? No.  Actions: * If pain score is 4 or above: No action needed, pain <4.Patient says he has a rash across lower back that he noticed Saturday after procedure on Friday. The rash is red and bumpy and os near where had back surgery scar from 2023 on L4-L5 . Rash does not extend to buttocks or buttocks crease .Patient will call primary care Dr to see if should use an ointment or cream Called patient.  Will call back if does not improve or worsens

## 2024-08-28 ENCOUNTER — Ambulatory Visit: Payer: Self-pay | Admitting: Internal Medicine

## 2024-08-28 LAB — SURGICAL PATHOLOGY

## 2024-11-28 ENCOUNTER — Ambulatory Visit (INDEPENDENT_AMBULATORY_CARE_PROVIDER_SITE_OTHER): Admitting: Otolaryngology
